# Patient Record
Sex: Male | Born: 1950 | Race: White | Hispanic: No | Smoking: Former smoker
Health system: Southern US, Community
[De-identification: ages and names within clinical notes are randomized; demographics above are authoritative.]

## PROBLEM LIST (undated history)

## (undated) DIAGNOSIS — K2270 Barrett's esophagus without dysplasia: Secondary | ICD-10-CM

## (undated) DIAGNOSIS — C649 Malignant neoplasm of unspecified kidney, except renal pelvis: Secondary | ICD-10-CM

## (undated) DIAGNOSIS — R1032 Left lower quadrant pain: Secondary | ICD-10-CM

## (undated) DIAGNOSIS — M25551 Pain in right hip: Secondary | ICD-10-CM

## (undated) DIAGNOSIS — F528 Other sexual dysfunction not due to a substance or known physiological condition: Secondary | ICD-10-CM

## (undated) DIAGNOSIS — M549 Dorsalgia, unspecified: Secondary | ICD-10-CM

## (undated) DIAGNOSIS — F4321 Adjustment disorder with depressed mood: Secondary | ICD-10-CM

## (undated) DIAGNOSIS — E785 Hyperlipidemia, unspecified: Secondary | ICD-10-CM

## (undated) DIAGNOSIS — I10 Essential (primary) hypertension: Secondary | ICD-10-CM

## (undated) DIAGNOSIS — F411 Generalized anxiety disorder: Secondary | ICD-10-CM

## (undated) DIAGNOSIS — K219 Gastro-esophageal reflux disease without esophagitis: Secondary | ICD-10-CM

## (undated) DIAGNOSIS — B351 Tinea unguium: Secondary | ICD-10-CM

## (undated) DIAGNOSIS — N400 Enlarged prostate without lower urinary tract symptoms: Secondary | ICD-10-CM

## (undated) DIAGNOSIS — Z0000 Encounter for general adult medical examination without abnormal findings: Secondary | ICD-10-CM

## (undated) DIAGNOSIS — E739 Lactose intolerance, unspecified: Secondary | ICD-10-CM

## (undated) HISTORY — DX: Left lower quadrant pain: R10.32

## (undated) HISTORY — DX: Essential (primary) hypertension: I10

## (undated) HISTORY — PX: CERVICAL SPINE SURGERY: SHX589

## (undated) HISTORY — DX: Generalized anxiety disorder: F41.1

## (undated) HISTORY — DX: Gastro-esophageal reflux disease without esophagitis: K21.9

## (undated) HISTORY — DX: Dorsalgia, unspecified: M54.9

## (undated) HISTORY — DX: Benign prostatic hyperplasia without lower urinary tract symptoms: N40.0

## (undated) HISTORY — DX: Tinea unguium: B35.1

## (undated) HISTORY — DX: Lactose intolerance, unspecified: E73.9

## (undated) HISTORY — DX: Other disorders of bilirubin metabolism: E80.6

## (undated) HISTORY — DX: Pain in right hip: M25.551

## (undated) HISTORY — DX: Malignant neoplasm of unspecified kidney, except renal pelvis: C64.9

## (undated) HISTORY — DX: Adjustment disorder with depressed mood: F43.21

## (undated) HISTORY — DX: Barrett's esophagus without dysplasia: K22.70

## (undated) HISTORY — DX: Other sexual dysfunction not due to a substance or known physiological condition: F52.8

## (undated) HISTORY — DX: Hyperlipidemia, unspecified: E78.5

## (undated) HISTORY — PX: PARTIAL NEPHRECTOMY: SHX414

## (undated) HISTORY — DX: Encounter for general adult medical examination without abnormal findings: Z00.00

---

## 1998-06-28 ENCOUNTER — Encounter: Admission: RE | Admit: 1998-06-28 | Discharge: 1998-09-26 | Payer: Self-pay | Admitting: Family Medicine

## 1999-09-26 ENCOUNTER — Encounter: Payer: Self-pay | Admitting: Orthopedic Surgery

## 1999-09-26 ENCOUNTER — Ambulatory Visit (HOSPITAL_COMMUNITY): Admission: EM | Admit: 1999-09-26 | Discharge: 1999-09-26 | Payer: Self-pay | Admitting: Emergency Medicine

## 1999-11-11 ENCOUNTER — Ambulatory Visit (HOSPITAL_BASED_OUTPATIENT_CLINIC_OR_DEPARTMENT_OTHER): Admission: RE | Admit: 1999-11-11 | Discharge: 1999-11-11 | Payer: Self-pay | Admitting: Surgery

## 2000-08-30 ENCOUNTER — Ambulatory Visit (HOSPITAL_COMMUNITY): Admission: RE | Admit: 2000-08-30 | Discharge: 2000-08-30 | Payer: Self-pay | Admitting: Gastroenterology

## 2000-08-30 ENCOUNTER — Encounter: Payer: Self-pay | Admitting: Gastroenterology

## 2000-09-04 ENCOUNTER — Encounter: Admission: RE | Admit: 2000-09-04 | Discharge: 2000-09-04 | Payer: Self-pay | Admitting: Gastroenterology

## 2000-09-04 ENCOUNTER — Encounter: Payer: Self-pay | Admitting: Gastroenterology

## 2000-09-07 ENCOUNTER — Encounter: Admission: RE | Admit: 2000-09-07 | Discharge: 2000-09-07 | Payer: Self-pay | Admitting: Urology

## 2000-09-07 ENCOUNTER — Encounter: Payer: Self-pay | Admitting: Urology

## 2000-09-24 ENCOUNTER — Ambulatory Visit: Admission: RE | Admit: 2000-09-24 | Discharge: 2000-09-24 | Payer: Self-pay | Admitting: Surgery

## 2001-03-04 ENCOUNTER — Encounter: Payer: Self-pay | Admitting: Gastroenterology

## 2001-03-04 ENCOUNTER — Encounter: Admission: RE | Admit: 2001-03-04 | Discharge: 2001-03-04 | Payer: Self-pay | Admitting: Gastroenterology

## 2001-03-06 ENCOUNTER — Encounter: Admission: RE | Admit: 2001-03-06 | Discharge: 2001-03-06 | Payer: Self-pay | Admitting: Urology

## 2001-03-06 ENCOUNTER — Encounter: Payer: Self-pay | Admitting: Urology

## 2001-03-11 ENCOUNTER — Encounter (INDEPENDENT_AMBULATORY_CARE_PROVIDER_SITE_OTHER): Payer: Self-pay | Admitting: Specialist

## 2001-03-11 ENCOUNTER — Ambulatory Visit (HOSPITAL_COMMUNITY): Admission: RE | Admit: 2001-03-11 | Discharge: 2001-03-11 | Payer: Self-pay | Admitting: Gastroenterology

## 2001-03-28 ENCOUNTER — Ambulatory Visit (HOSPITAL_COMMUNITY): Admission: RE | Admit: 2001-03-28 | Discharge: 2001-03-28 | Payer: Self-pay | Admitting: Gastroenterology

## 2001-03-28 ENCOUNTER — Encounter: Payer: Self-pay | Admitting: Gastroenterology

## 2001-07-15 ENCOUNTER — Encounter: Admission: RE | Admit: 2001-07-15 | Discharge: 2001-07-15 | Payer: Self-pay | Admitting: Urology

## 2001-07-15 ENCOUNTER — Encounter: Payer: Self-pay | Admitting: Urology

## 2001-11-25 ENCOUNTER — Encounter: Admission: RE | Admit: 2001-11-25 | Discharge: 2001-11-25 | Payer: Self-pay | Admitting: Urology

## 2001-11-25 ENCOUNTER — Encounter: Payer: Self-pay | Admitting: Urology

## 2002-05-09 ENCOUNTER — Encounter: Admission: RE | Admit: 2002-05-09 | Discharge: 2002-05-09 | Payer: Self-pay | Admitting: Urology

## 2002-05-09 ENCOUNTER — Encounter: Payer: Self-pay | Admitting: Urology

## 2002-05-28 ENCOUNTER — Emergency Department (HOSPITAL_COMMUNITY): Admission: EM | Admit: 2002-05-28 | Discharge: 2002-05-28 | Payer: Self-pay | Admitting: Emergency Medicine

## 2002-06-15 ENCOUNTER — Emergency Department (HOSPITAL_COMMUNITY): Admission: EM | Admit: 2002-06-15 | Discharge: 2002-06-16 | Payer: Self-pay | Admitting: Emergency Medicine

## 2002-06-15 ENCOUNTER — Emergency Department (HOSPITAL_COMMUNITY): Admission: EM | Admit: 2002-06-15 | Discharge: 2002-06-15 | Payer: Self-pay | Admitting: Emergency Medicine

## 2002-06-25 ENCOUNTER — Emergency Department (HOSPITAL_COMMUNITY): Admission: EM | Admit: 2002-06-25 | Discharge: 2002-06-26 | Payer: Self-pay | Admitting: Emergency Medicine

## 2003-05-11 ENCOUNTER — Encounter: Admission: RE | Admit: 2003-05-11 | Discharge: 2003-05-11 | Payer: Self-pay | Admitting: Urology

## 2003-08-07 ENCOUNTER — Emergency Department (HOSPITAL_COMMUNITY): Admission: EM | Admit: 2003-08-07 | Discharge: 2003-08-07 | Payer: Self-pay | Admitting: Emergency Medicine

## 2003-08-10 ENCOUNTER — Encounter: Admission: RE | Admit: 2003-08-10 | Discharge: 2003-08-10 | Payer: Self-pay | Admitting: Gastroenterology

## 2003-08-26 ENCOUNTER — Ambulatory Visit (HOSPITAL_COMMUNITY): Admission: RE | Admit: 2003-08-26 | Discharge: 2003-08-26 | Payer: Self-pay | Admitting: Gastroenterology

## 2003-08-26 ENCOUNTER — Encounter (INDEPENDENT_AMBULATORY_CARE_PROVIDER_SITE_OTHER): Payer: Self-pay | Admitting: Specialist

## 2003-10-25 ENCOUNTER — Emergency Department (HOSPITAL_COMMUNITY): Admission: EM | Admit: 2003-10-25 | Discharge: 2003-10-25 | Payer: Self-pay | Admitting: Emergency Medicine

## 2003-11-06 ENCOUNTER — Emergency Department (HOSPITAL_COMMUNITY): Admission: EM | Admit: 2003-11-06 | Discharge: 2003-11-06 | Payer: Self-pay

## 2004-04-21 ENCOUNTER — Encounter: Admission: RE | Admit: 2004-04-21 | Discharge: 2004-04-21 | Payer: Self-pay | Admitting: Gastroenterology

## 2004-05-12 ENCOUNTER — Encounter: Admission: RE | Admit: 2004-05-12 | Discharge: 2004-08-10 | Payer: Self-pay | Admitting: Family Medicine

## 2004-06-16 ENCOUNTER — Ambulatory Visit (HOSPITAL_BASED_OUTPATIENT_CLINIC_OR_DEPARTMENT_OTHER): Admission: RE | Admit: 2004-06-16 | Discharge: 2004-06-16 | Payer: Self-pay | Admitting: Otolaryngology

## 2005-04-10 ENCOUNTER — Encounter: Admission: RE | Admit: 2005-04-10 | Discharge: 2005-04-10 | Payer: Self-pay | Admitting: Psychiatry

## 2005-04-22 ENCOUNTER — Encounter: Admission: RE | Admit: 2005-04-22 | Discharge: 2005-04-22 | Payer: Self-pay | Admitting: Psychiatry

## 2005-09-25 ENCOUNTER — Encounter: Admission: RE | Admit: 2005-09-25 | Discharge: 2005-09-25 | Payer: Self-pay | Admitting: Gastroenterology

## 2005-11-17 ENCOUNTER — Encounter: Admission: RE | Admit: 2005-11-17 | Discharge: 2005-11-17 | Payer: Self-pay | Admitting: Otolaryngology

## 2006-01-03 ENCOUNTER — Encounter: Admission: RE | Admit: 2006-01-03 | Discharge: 2006-01-03 | Payer: Self-pay | Admitting: Neurosurgery

## 2006-01-05 ENCOUNTER — Encounter: Admission: RE | Admit: 2006-01-05 | Discharge: 2006-01-05 | Payer: Self-pay | Admitting: Neurosurgery

## 2006-03-12 ENCOUNTER — Ambulatory Visit: Payer: Self-pay | Admitting: Family Medicine

## 2006-06-04 ENCOUNTER — Ambulatory Visit (HOSPITAL_COMMUNITY): Admission: RE | Admit: 2006-06-04 | Discharge: 2006-06-05 | Payer: Self-pay | Admitting: Neurosurgery

## 2006-08-16 DIAGNOSIS — G4700 Insomnia, unspecified: Secondary | ICD-10-CM

## 2006-08-16 DIAGNOSIS — M545 Low back pain, unspecified: Secondary | ICD-10-CM | POA: Insufficient documentation

## 2006-08-16 DIAGNOSIS — F528 Other sexual dysfunction not due to a substance or known physiological condition: Secondary | ICD-10-CM

## 2006-08-16 DIAGNOSIS — I10 Essential (primary) hypertension: Secondary | ICD-10-CM | POA: Insufficient documentation

## 2006-08-16 DIAGNOSIS — F909 Attention-deficit hyperactivity disorder, unspecified type: Secondary | ICD-10-CM

## 2006-08-16 DIAGNOSIS — K219 Gastro-esophageal reflux disease without esophagitis: Secondary | ICD-10-CM

## 2006-08-16 DIAGNOSIS — E785 Hyperlipidemia, unspecified: Secondary | ICD-10-CM

## 2006-08-16 HISTORY — DX: Other sexual dysfunction not due to a substance or known physiological condition: F52.8

## 2006-08-16 HISTORY — DX: Gastro-esophageal reflux disease without esophagitis: K21.9

## 2006-08-16 HISTORY — DX: Hyperlipidemia, unspecified: E78.5

## 2006-09-13 ENCOUNTER — Encounter: Admission: RE | Admit: 2006-09-13 | Discharge: 2006-09-13 | Payer: Self-pay | Admitting: Neurosurgery

## 2006-12-10 ENCOUNTER — Ambulatory Visit: Payer: Self-pay | Admitting: Family Medicine

## 2006-12-17 ENCOUNTER — Ambulatory Visit: Payer: Self-pay | Admitting: Family Medicine

## 2006-12-25 LAB — CONVERTED CEMR LAB
HDL: 43.8 mg/dL (ref >39.0)
Triglycerides: 251 mg/dL (ref 0–149)

## 2006-12-31 ENCOUNTER — Telehealth (INDEPENDENT_AMBULATORY_CARE_PROVIDER_SITE_OTHER): Payer: Self-pay | Admitting: *Deleted

## 2007-01-02 ENCOUNTER — Encounter: Admission: RE | Admit: 2007-01-02 | Discharge: 2007-01-02 | Payer: Self-pay | Admitting: Neurosurgery

## 2007-01-04 ENCOUNTER — Encounter (INDEPENDENT_AMBULATORY_CARE_PROVIDER_SITE_OTHER): Payer: Self-pay | Admitting: Family Medicine

## 2007-01-16 ENCOUNTER — Encounter (INDEPENDENT_AMBULATORY_CARE_PROVIDER_SITE_OTHER): Payer: Self-pay | Admitting: Family Medicine

## 2007-01-18 ENCOUNTER — Telehealth (INDEPENDENT_AMBULATORY_CARE_PROVIDER_SITE_OTHER): Payer: Self-pay | Admitting: *Deleted

## 2007-01-28 ENCOUNTER — Encounter (INDEPENDENT_AMBULATORY_CARE_PROVIDER_SITE_OTHER): Payer: Self-pay | Admitting: Family Medicine

## 2007-02-21 ENCOUNTER — Encounter (INDEPENDENT_AMBULATORY_CARE_PROVIDER_SITE_OTHER): Payer: Self-pay | Admitting: Family Medicine

## 2007-03-12 ENCOUNTER — Encounter: Admission: RE | Admit: 2007-03-12 | Discharge: 2007-03-12 | Payer: Self-pay | Admitting: Neurosurgery

## 2007-03-18 ENCOUNTER — Telehealth (INDEPENDENT_AMBULATORY_CARE_PROVIDER_SITE_OTHER): Payer: Self-pay | Admitting: *Deleted

## 2007-03-25 ENCOUNTER — Telehealth (INDEPENDENT_AMBULATORY_CARE_PROVIDER_SITE_OTHER): Payer: Self-pay | Admitting: *Deleted

## 2007-04-02 ENCOUNTER — Ambulatory Visit: Payer: Self-pay | Admitting: Family Medicine

## 2007-04-03 ENCOUNTER — Telehealth (INDEPENDENT_AMBULATORY_CARE_PROVIDER_SITE_OTHER): Payer: Self-pay | Admitting: *Deleted

## 2007-04-08 ENCOUNTER — Ambulatory Visit: Payer: Self-pay | Admitting: Family Medicine

## 2007-04-08 DIAGNOSIS — F411 Generalized anxiety disorder: Secondary | ICD-10-CM | POA: Insufficient documentation

## 2007-04-08 DIAGNOSIS — K120 Recurrent oral aphthae: Secondary | ICD-10-CM | POA: Insufficient documentation

## 2007-04-08 HISTORY — DX: Generalized anxiety disorder: F41.1

## 2007-05-13 ENCOUNTER — Telehealth (INDEPENDENT_AMBULATORY_CARE_PROVIDER_SITE_OTHER): Payer: Self-pay | Admitting: Family Medicine

## 2007-05-13 ENCOUNTER — Ambulatory Visit: Payer: Self-pay | Admitting: Family Medicine

## 2007-05-13 DIAGNOSIS — K4090 Unilateral inguinal hernia, without obstruction or gangrene, not specified as recurrent: Secondary | ICD-10-CM

## 2007-05-16 ENCOUNTER — Encounter: Admission: RE | Admit: 2007-05-16 | Discharge: 2007-05-16 | Payer: Self-pay | Admitting: Neurosurgery

## 2007-06-12 ENCOUNTER — Encounter: Admission: RE | Admit: 2007-06-12 | Discharge: 2007-06-12 | Payer: Self-pay | Admitting: Surgery

## 2007-06-17 ENCOUNTER — Ambulatory Visit: Admission: RE | Admit: 2007-06-17 | Discharge: 2007-06-17 | Payer: Self-pay | Admitting: Neurosurgery

## 2007-07-15 ENCOUNTER — Telehealth (INDEPENDENT_AMBULATORY_CARE_PROVIDER_SITE_OTHER): Payer: Self-pay | Admitting: *Deleted

## 2007-07-15 ENCOUNTER — Encounter (INDEPENDENT_AMBULATORY_CARE_PROVIDER_SITE_OTHER): Payer: Self-pay | Admitting: *Deleted

## 2007-07-16 ENCOUNTER — Inpatient Hospital Stay (HOSPITAL_COMMUNITY): Admission: RE | Admit: 2007-07-16 | Discharge: 2007-07-17 | Payer: Self-pay | Admitting: Neurosurgery

## 2007-09-05 ENCOUNTER — Telehealth: Payer: Self-pay | Admitting: Internal Medicine

## 2007-10-03 ENCOUNTER — Encounter: Admission: RE | Admit: 2007-10-03 | Discharge: 2007-10-03 | Payer: Self-pay | Admitting: Neurosurgery

## 2007-11-04 ENCOUNTER — Telehealth (INDEPENDENT_AMBULATORY_CARE_PROVIDER_SITE_OTHER): Payer: Self-pay | Admitting: *Deleted

## 2007-11-11 ENCOUNTER — Ambulatory Visit: Payer: Self-pay | Admitting: *Deleted

## 2007-11-11 DIAGNOSIS — K2270 Barrett's esophagus without dysplasia: Secondary | ICD-10-CM

## 2007-11-11 DIAGNOSIS — M549 Dorsalgia, unspecified: Secondary | ICD-10-CM | POA: Insufficient documentation

## 2007-11-11 DIAGNOSIS — M546 Pain in thoracic spine: Secondary | ICD-10-CM | POA: Insufficient documentation

## 2007-11-11 HISTORY — DX: Dorsalgia, unspecified: M54.9

## 2007-11-11 HISTORY — DX: Barrett's esophagus without dysplasia: K22.70

## 2007-11-12 ENCOUNTER — Encounter: Admission: RE | Admit: 2007-11-12 | Discharge: 2007-11-12 | Payer: Self-pay | Admitting: Neurosurgery

## 2007-11-16 ENCOUNTER — Encounter: Admission: RE | Admit: 2007-11-16 | Discharge: 2007-11-16 | Payer: Self-pay | Admitting: Neurosurgery

## 2007-12-09 ENCOUNTER — Ambulatory Visit: Payer: Self-pay | Admitting: *Deleted

## 2007-12-09 LAB — CONVERTED CEMR LAB
AST: 30 units/L (ref 0–37)
Albumin: 4.1 g/dL (ref 3.5–5.2)
Alkaline Phosphatase: 73 units/L (ref 39–117)
BUN: 22 mg/dL (ref 6–23)
Chloride: 112 meq/L (ref 96–112)
Eosinophils Relative: 0.8 % (ref 0.0–5.0)
GFR calc non Af Amer: 93 mL/min
Glucose, Bld: 103 mg/dL — ABNORMAL HIGH (ref 70–99)
HDL: 44.9 mg/dL (ref >39.0)
Lymphocytes Relative: 31 % (ref 12.0–46.0)
Monocytes Relative: 12.1 % — ABNORMAL HIGH (ref 3.0–12.0)
Neutrophils Relative %: 55.6 % (ref 43.0–77.0)
Platelets: 159 10*3/uL (ref 150–400)
Potassium: 4.3 meq/L (ref 3.5–5.1)
Total CHOL/HDL Ratio: 3.8
Total Protein: 6.3 g/dL (ref 6.0–8.3)
Triglycerides: 209 mg/dL (ref 0–149)
WBC: 4.1 10*3/uL — ABNORMAL LOW (ref 4.5–10.5)

## 2007-12-11 ENCOUNTER — Telehealth (INDEPENDENT_AMBULATORY_CARE_PROVIDER_SITE_OTHER): Payer: Self-pay | Admitting: *Deleted

## 2007-12-11 DIAGNOSIS — D72818 Other decreased white blood cell count: Secondary | ICD-10-CM | POA: Insufficient documentation

## 2007-12-11 DIAGNOSIS — R17 Unspecified jaundice: Secondary | ICD-10-CM | POA: Insufficient documentation

## 2007-12-11 DIAGNOSIS — E739 Lactose intolerance, unspecified: Secondary | ICD-10-CM | POA: Insufficient documentation

## 2007-12-11 HISTORY — DX: Lactose intolerance, unspecified: E73.9

## 2007-12-16 ENCOUNTER — Ambulatory Visit: Payer: Self-pay | Admitting: *Deleted

## 2008-01-20 ENCOUNTER — Ambulatory Visit: Payer: Self-pay | Admitting: *Deleted

## 2008-01-22 HISTORY — DX: Other disorders of bilirubin metabolism: E80.6

## 2008-01-22 LAB — CONVERTED CEMR LAB
Albumin: 4 g/dL (ref 3.5–5.2)
Alkaline Phosphatase: 75 units/L (ref 39–117)
Basophils Absolute: 0 10*3/uL (ref 0.0–0.1)
Basophils Relative: 0.4 % (ref 0.0–3.0)
Eosinophils Absolute: 0 10*3/uL (ref 0.0–0.7)
Glucose, Bld: 103 mg/dL — ABNORMAL HIGH (ref 70–99)
MCHC: 35.3 g/dL (ref 30.0–36.0)
MCV: 95.1 fL (ref 78.0–100.0)
Monocytes Absolute: 0.5 10*3/uL (ref 0.1–1.0)
Neutrophils Relative %: 55.6 % (ref 43.0–77.0)
RBC: 4.55 M/uL (ref 4.22–5.81)

## 2008-01-24 ENCOUNTER — Ambulatory Visit: Payer: Self-pay | Admitting: Hematology & Oncology

## 2008-02-03 ENCOUNTER — Encounter (INDEPENDENT_AMBULATORY_CARE_PROVIDER_SITE_OTHER): Payer: Self-pay | Admitting: *Deleted

## 2008-02-03 LAB — CBC WITH DIFFERENTIAL (CANCER CENTER ONLY)
BASO%: 0.6 % (ref 0.0–2.0)
EOS%: 1.2 % (ref 0.0–7.0)
HCT: 45 % (ref 38.7–49.9)
LYMPH%: 18 % (ref 14.0–48.0)
MCHC: 34.6 g/dL (ref 32.0–35.9)
MCV: 93 fL (ref 82–98)
NEUT%: 72.9 % (ref 40.0–80.0)
RDW: 11.8 % (ref 10.5–14.6)

## 2008-02-19 ENCOUNTER — Telehealth (INDEPENDENT_AMBULATORY_CARE_PROVIDER_SITE_OTHER): Payer: Self-pay | Admitting: *Deleted

## 2008-04-13 ENCOUNTER — Telehealth (INDEPENDENT_AMBULATORY_CARE_PROVIDER_SITE_OTHER): Payer: Self-pay | Admitting: *Deleted

## 2008-05-01 ENCOUNTER — Ambulatory Visit: Payer: Self-pay | Admitting: Hematology & Oncology

## 2008-05-04 LAB — CBC WITH DIFFERENTIAL (CANCER CENTER ONLY)
BASO%: 0.6 % (ref 0.0–2.0)
Eosinophils Absolute: 0.1 10*3/uL (ref 0.0–0.5)
HCT: 45.4 % (ref 38.7–49.9)
LYMPH%: 22.9 % (ref 14.0–48.0)
MCH: 32.7 pg (ref 28.0–33.4)
MCV: 96 fL (ref 82–98)
MONO#: 0.4 10*3/uL (ref 0.1–0.9)
NEUT%: 67.1 % (ref 40.0–80.0)
RDW: 11.5 % (ref 10.5–14.6)
WBC: 4.5 10*3/uL (ref 4.0–10.0)

## 2008-05-18 ENCOUNTER — Ambulatory Visit: Payer: Self-pay | Admitting: *Deleted

## 2008-05-18 LAB — CONVERTED CEMR LAB
BUN: 21 mg/dL (ref 6–23)
CO2: 31 meq/L (ref 19–32)
Calcium: 9.3 mg/dL (ref 8.4–10.5)
Cholesterol: 195 mg/dL (ref 0–200)
Creatinine, Ser: 1 mg/dL (ref 0.4–1.5)
GFR calc Af Amer: 99 mL/min
GFR calc non Af Amer: 82 mL/min
Glucose, Bld: 106 mg/dL — ABNORMAL HIGH (ref 70–99)
HDL: 41.7 mg/dL (ref >39.0)
Total Bilirubin: 1.1 mg/dL (ref 0.3–1.2)
Total CHOL/HDL Ratio: 4.7
Triglycerides: 308 mg/dL (ref 0–149)

## 2008-05-22 ENCOUNTER — Telehealth (INDEPENDENT_AMBULATORY_CARE_PROVIDER_SITE_OTHER): Payer: Self-pay | Admitting: *Deleted

## 2008-06-10 ENCOUNTER — Ambulatory Visit: Payer: Self-pay | Admitting: *Deleted

## 2008-06-10 DIAGNOSIS — J40 Bronchitis, not specified as acute or chronic: Secondary | ICD-10-CM | POA: Insufficient documentation

## 2008-06-29 ENCOUNTER — Telehealth (INDEPENDENT_AMBULATORY_CARE_PROVIDER_SITE_OTHER): Payer: Self-pay | Admitting: *Deleted

## 2008-07-15 ENCOUNTER — Encounter
Admission: RE | Admit: 2008-07-15 | Discharge: 2008-07-15 | Payer: Self-pay | Admitting: Physical Medicine and Rehabilitation

## 2008-07-21 ENCOUNTER — Telehealth (INDEPENDENT_AMBULATORY_CARE_PROVIDER_SITE_OTHER): Payer: Self-pay | Admitting: *Deleted

## 2008-09-25 ENCOUNTER — Ambulatory Visit: Payer: Self-pay | Admitting: Hematology & Oncology

## 2008-09-28 ENCOUNTER — Encounter: Payer: Self-pay | Admitting: Internal Medicine

## 2008-09-28 LAB — CBC WITH DIFFERENTIAL (CANCER CENTER ONLY)
BASO#: 0 10*3/uL (ref 0.0–0.2)
Eosinophils Absolute: 0.1 10*3/uL (ref 0.0–0.5)
HCT: 46.1 % (ref 38.7–49.9)
HGB: 15.5 g/dL (ref 13.0–17.1)
LYMPH%: 24.9 % (ref 14.0–48.0)
MCV: 96 fL (ref 82–98)
MONO#: 0.4 10*3/uL (ref 0.1–0.9)
NEUT%: 65.5 % (ref 40.0–80.0)
Platelets: 203 10*3/uL (ref 145–400)
RBC: 4.82 10*6/uL (ref 4.20–5.70)
WBC: 4.9 10*3/uL (ref 4.0–10.0)

## 2008-10-12 ENCOUNTER — Ambulatory Visit: Payer: Self-pay | Admitting: Internal Medicine

## 2008-10-12 DIAGNOSIS — IMO0001 Reserved for inherently not codable concepts without codable children: Secondary | ICD-10-CM

## 2008-10-12 LAB — CONVERTED CEMR LAB
AST: 27 units/L (ref 0–37)
Albumin: 4.4 g/dL (ref 3.5–5.2)
Alkaline Phosphatase: 89 units/L (ref 39–117)
BUN: 21 mg/dL (ref 6–23)
Calcium: 9.4 mg/dL (ref 8.4–10.5)
Creatinine, Ser: 1.02 mg/dL (ref 0.40–1.50)
HDL goal, serum: 40 mg/dL
HDL: 45 mg/dL (ref >39)
Hgb A1c MFr Bld: 5.7 % (ref 4.6–6.1)
Indirect Bilirubin: 0.7 mg/dL (ref 0.0–0.9)
LDL Goal: 160 mg/dL
Total Bilirubin: 0.9 mg/dL (ref 0.3–1.2)
Total Protein: 6.9 g/dL (ref 6.0–8.3)
Triglycerides: 286 mg/dL — ABNORMAL HIGH (ref <150)
Vit D, 1,25-Dihydroxy: 41 (ref 30–89)

## 2008-10-16 ENCOUNTER — Encounter: Payer: Self-pay | Admitting: Internal Medicine

## 2008-11-04 ENCOUNTER — Telehealth: Payer: Self-pay | Admitting: Internal Medicine

## 2008-11-13 ENCOUNTER — Ambulatory Visit: Payer: Self-pay | Admitting: Diagnostic Radiology

## 2008-11-13 ENCOUNTER — Emergency Department (HOSPITAL_BASED_OUTPATIENT_CLINIC_OR_DEPARTMENT_OTHER): Admission: EM | Admit: 2008-11-13 | Discharge: 2008-11-13 | Payer: Self-pay | Admitting: Emergency Medicine

## 2008-11-15 ENCOUNTER — Emergency Department (HOSPITAL_BASED_OUTPATIENT_CLINIC_OR_DEPARTMENT_OTHER): Admission: EM | Admit: 2008-11-15 | Discharge: 2008-11-15 | Payer: Self-pay | Admitting: Emergency Medicine

## 2008-11-15 ENCOUNTER — Ambulatory Visit: Payer: Self-pay | Admitting: Diagnostic Radiology

## 2008-12-14 ENCOUNTER — Encounter: Payer: Self-pay | Admitting: Internal Medicine

## 2009-04-24 LAB — HM COLONOSCOPY

## 2009-04-26 ENCOUNTER — Ambulatory Visit: Payer: Self-pay | Admitting: Internal Medicine

## 2009-08-12 ENCOUNTER — Telehealth: Payer: Self-pay | Admitting: Internal Medicine

## 2009-10-04 ENCOUNTER — Ambulatory Visit: Payer: Self-pay | Admitting: Internal Medicine

## 2009-10-04 DIAGNOSIS — R5381 Other malaise: Secondary | ICD-10-CM

## 2009-10-04 DIAGNOSIS — R5383 Other fatigue: Secondary | ICD-10-CM

## 2009-10-05 ENCOUNTER — Encounter: Payer: Self-pay | Admitting: Internal Medicine

## 2009-10-26 IMAGING — CR DG CHEST 2V
2 series · 2 of 2 positions shown · non-contrast
Comparison: Chest 08/07/03.

CLINICAL DATA: Abdominal pain particularly right lower quadrant.  History of renal cell carcinoma in 7990. 
 CHEST - 2 VIEW:

[view not recorded (1 of 2)]
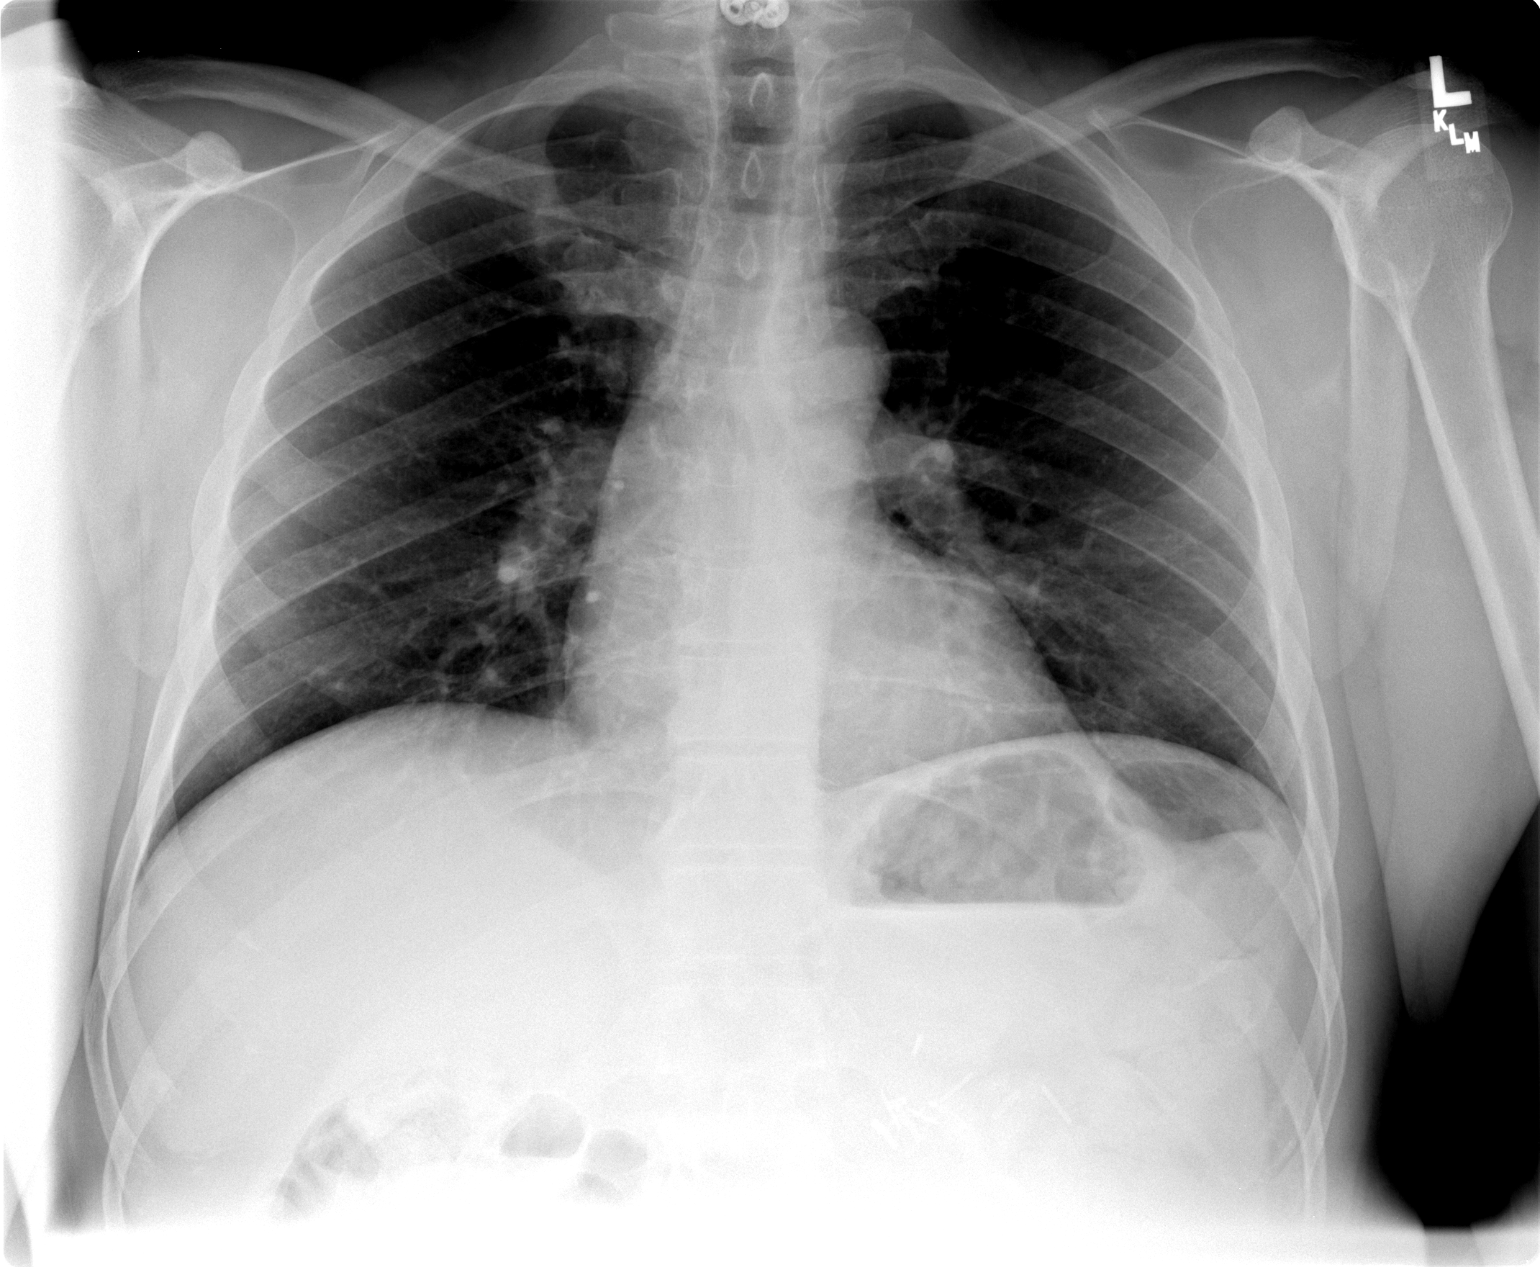

[view not recorded (2 of 2)]
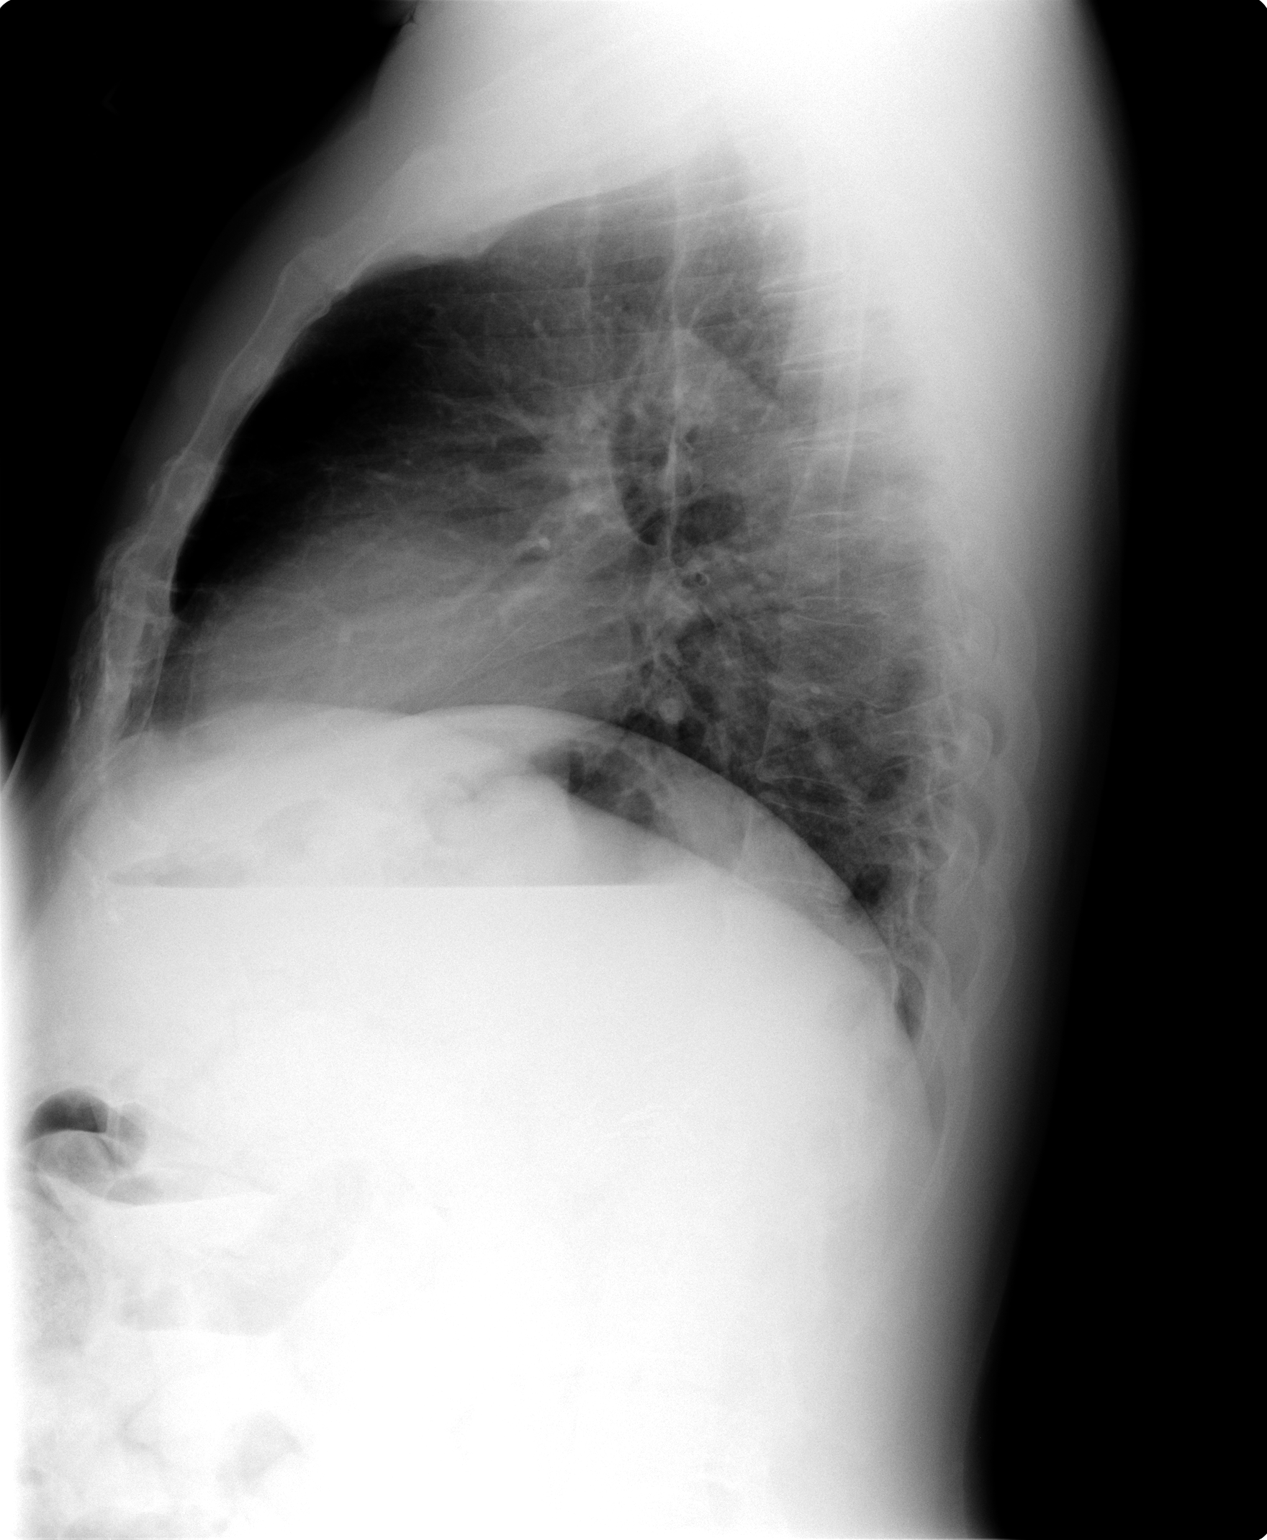

[2 of 2 positions shown; findings below may reference images not displayed]

FINDINGS: The lungs are clear.  The heart is within normal limits in size.  No bony abnormality is seen.
IMPRESSION: No active lung disease.

## 2010-01-31 ENCOUNTER — Encounter: Admission: RE | Admit: 2010-01-31 | Discharge: 2010-01-31 | Payer: Self-pay | Admitting: Neurosurgery

## 2010-05-15 ENCOUNTER — Encounter: Payer: Self-pay | Admitting: Neurosurgery

## 2010-05-22 LAB — CONVERTED CEMR LAB
ALT: 41 units/L (ref 0–53)
BUN: 20 mg/dL (ref 6–23)
Basophils Absolute: 0.1 10*3/uL (ref 0.0–0.1)
Bilirubin, Direct: 0.2 mg/dL (ref 0.0–0.3)
CO2: 24 meq/L (ref 19–32)
Chloride: 103 meq/L (ref 96–112)
Eosinophils Relative: 0 % (ref 0–5)
Glucose, Bld: 101 mg/dL — ABNORMAL HIGH (ref 70–99)
HCT: 51.5 % (ref 39.0–52.0)
Hemoglobin: 16.5 g/dL (ref 13.0–17.0)
Indirect Bilirubin: 0.9 mg/dL (ref 0.0–0.9)
LDL Cholesterol: 98 mg/dL (ref 0–99)
Lymphocytes Relative: 25 % (ref 12–46)
Monocytes Absolute: 0.6 10*3/uL (ref 0.1–1.0)
Monocytes Relative: 11 % (ref 3–12)
Neutro Abs: 3.5 10*3/uL (ref 1.7–7.7)
Potassium: 4.7 meq/L (ref 3.5–5.3)
RBC: 5.07 M/uL (ref 4.22–5.81)
RDW: 15 % (ref 11.5–15.5)
Sodium: 140 meq/L (ref 135–145)
TSH: 1.337 microintl units/mL (ref 0.350–4.500)
Testosterone: 312.94 ng/dL — ABNORMAL LOW (ref 350–890)
Total Bilirubin: 1.1 mg/dL (ref 0.3–1.2)
Total CHOL/HDL Ratio: 4.2
Total CK: 160 units/L (ref 7–232)
VLDL: 64 mg/dL — ABNORMAL HIGH (ref 0–40)

## 2010-05-26 NOTE — Assessment & Plan Note (Signed)
Summary: 6 MONTHS ROV-CH   Vital Signs:  Patient profile:   60 year old male Height:      65.5 inches Weight:      183.75 pounds BMI:     30.22 O2 Sat:      97 % on Room air Temp:     98.2 degrees F oral Pulse rate:   70 / minute BP sitting:   126 / 84  (right arm)  Vitals Entered By: Lucious Groves (April 26, 2009 10:44 AM)  O2 Flow:  Room air CC: Est pt 6 mo f/u--./kb Pain Assessment Patient in pain? no        Primary Care Provider:  Dondra Spry DO  CC:  Est pt 6 mo f/u--./kb.  History of Present Illness:  Hyperlipidemia Follow-Up      This is a 60 year old man who presents for Hyperlipidemia follow-up.  The patient denies muscle aches.  The patient denies the following symptoms: chest pain/pressure.  Compliance with medications (by patient report) has been near 100%.  Dietary compliance has been fair.    Hx of Barretts - consultant notes reviewed  Current Medications (verified): 1)  Lipitor 40 Mg Tabs (Atorvastatin Calcium) .... Take 1 Tablet By Mouth Once A Day 2)  Levitra 20 Mg Tabs (Vardenafil Hcl) .... Take As Directed. 3)  Niaspan 1000 Mg Tbcr (Niacin (Antihyperlipidemic)) .... Take 1 Tablet By Mouth Once A Day 4)  Adderall 20 Mg  Tabs (Amphetamine-Dextroamphetamine) .... Take 1 Tablet By Mouth Three Times A Day 5)  Hydrocodone-Acetaminophen 5-500 Mg  Tabs (Hydrocodone-Acetaminophen) .... Take One Tablet Three Times A Day 6)  Lunesta 3 Mg  Tabs (Eszopiclone) .... Take One Tablet Each Evening. 7)  Effexor 75 Mg  Tabs (Venlafaxine Hcl) .... Take One Tablet Three Times A Day 8)  Trazodone Hcl 300 Mg  Tabs (Trazodone Hcl) .... Take One Tablet Each Evening. 9)  Magic Mouth Wash .... 1 Tsp Swish and Spit Qid 10)  Nexium 40 Mg  Cpdr (Esomeprazole Magnesium) .... Take 1 Tablet By Mouth Once A Day 11)  Omega-3 350 Mg Caps (Omega-3 Fatty Acids) .... 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) - sees GI glucose intolerance Gilbert's syndrome  Current Problems:  OTHER DECREASED WHITE BLOOD CELL COUNT (ICD-288.59) - sees hematology ELEVATED BP READING WITHOUT DX HYPERTENSION (ICD-796.2) BACK PAIN, CHRONIC (ICD-724.5) - sees neurosurgery and pain management OTHER ANXIETY STATES (ICD-300.09)) - sees psych recurrent bronchitis    Past Surgical History: partial nephrectomy right side for renal cell carcinoma left hand surgery  2002  Cervical fusion 2008, 2009    Family History: alzheimer: mother Family History High cholesterol - mother and father    Social History: Occupation: Geneticist, 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   Neck:  No deformities, masses, or tenderness noted.no carotid bruits.   Lungs:  normal respiratory effort and normal breath sounds.   Heart:  normal rate, regular rhythm, and no gallop.   Psych:  normally interactive, good eye contact, not anxious appearing, and not depressed appearing.     Impression & Recommendations:  Problem # 1:  HYPERLIPIDEMIA (ICD-272.4) stable.  Maintain current medication regimen.  His updated medication list for this problem includes:    Lipitor 40 Mg Tabs (Atorvastatin calcium) .Marland Kitchen... Take 1 tablet by mouth once a day    Niaspan 1000 Mg Tbcr (Niacin (antihyperlipidemic)) .Marland Kitchen... Take 1 tablet by mouth once a day  Labs Reviewed: SGOT: 27 (10/12/2008)   SGPT: 40 (10/12/2008)  Lipid Goals: Chol Goal: 200 (10/12/2008)   HDL Goal: 40 (10/12/2008)   LDL Goal: 160 (10/12/2008)   TG Goal: 150 (10/12/2008)  Prior 10 Yr Risk Heart Disease: 4 % (10/12/2008)   HDL:45 (10/12/2008), 41.7  (05/18/2008)  LDL:66 (10/12/2008), 92 (05/18/2008)  Chol:168 (10/12/2008), 195 (05/18/2008)  Trig:286 (10/12/2008), 308 (05/18/2008)  Problem # 2:  BARRETTS ESOPHAGUS (ICD-530.85) EGD performed 12/14/2008 - Dr. Ewing Schlein.  No malignancy.  Continue PPI.  Problem # 3:  ERECTILE DYSFUNCTION (ICD-302.72)  His updated medication list for this problem includes:    Levitra 20 Mg Tabs (Vardenafil hcl) .Marland Kitchen... Take as directed.  Discussed proper use of medications, as well as side effects.   Complete Medication List: 1)  Lipitor 40 Mg Tabs (Atorvastatin calcium) .... Take 1 tablet by mouth once a day 2)  Levitra 20 Mg Tabs (Vardenafil hcl) .... Take as directed. 3)  Niaspan 1000 Mg Tbcr (Niacin (antihyperlipidemic)) .... Take 1 tablet by mouth once a day 4)  Adderall 20 Mg Tabs (Amphetamine-dextroamphetamine) .... Take 1 tablet by mouth three times a day 5)  Hydrocodone-acetaminophen 5-500 Mg Tabs (Hydrocodone-acetaminophen) .... Take one tablet three times a day 6)  Lunesta 3 Mg Tabs (Eszopiclone) .... Take one tablet each evening. 7)  Effexor 75 Mg Tabs (Venlafaxine hcl) .... Take one tablet three times a day 8)  Trazodone Hcl 300 Mg Tabs (Trazodone hcl) .... Take one tablet each evening. 9)  Magic Mouth Wash  .... 1 tsp swish and spit qid 10)  Nexium 40 Mg Cpdr (Esomeprazole magnesium) .... Take 1 tablet by mouth once a day 11)  Omega-3 350 Mg Caps (Omega-3 fatty acids) .... Over the counter as directed on container  Other Orders: T-Basic Metabolic Panel [phone]) T- Hemoglobin A1C ([id number])  Patient Instructions: 1)  Please schedule a follow-up appointment in 6 months for CPX. 2)  BMP prior to visit, ICD-9:  272.4 3)  Hepatic Panel prior to visit, ICD-9:  272.4 4)  Lipid Panel prior to visit, ICD-9: 272.4 5)  TSH prior to visit, ICD-9: 272.4 6)  HbgA1C prior to visit, ICD-9: 790.29 7)  Please return for lab work one (1) week before your next appointment.  Prescriptions: LEVITRA 20  MG TABS (VARDENAFIL HCL) Take as directed.  #12 Tablet x 3   Entered and Authorized by:   [name] DO   Signed by:   [name] DO on 04/26/2009   Method used:   [facility] to        [facility] [phone][facility] (retail)       [address]       [place], [place]  [address]       Ph: [phone]       Fax: [phone]   RxID:   [id number]     Immunization History:  Influenza Immunization History:    Influenza:  historical (01/22/2009)

## 2010-05-26 NOTE — Assessment & Plan Note (Signed)
Summary: CPX/HEA--Rm 3   Vital Signs:  Patient profile:   60 year old male Height:      65.5 inches Weight:      170.50 pounds BMI:     28.04 Temp:     97.7 degrees F oral Pulse rate:   84 / minute Pulse rhythm:   regular Resp:     16 per minute BP sitting:   118 / 84  (right arm) Cuff size:   regular  Vitals Entered By: Mervin Kung CMA (October 04, 2009 8:39 AM) CC: Room 3  Pt here for physical.  Would like thyroid checked due to excessive sweating and fatigue.   Primary Care Provider:  Dondra Spry DO  CC:  Room 3  Pt here for physical.  Would like thyroid checked due to excessive sweating and fatigue.Marland Kitchen  History of Present Illness: 60 y/o white male with hx of hyperlipidemia, ADD, and barretts for routin CpX.    Effexor has changed to 1 two times a day  and Adderall has changed to 1 two times a day.  no other significant interval hx   Allergies (verified): No Known Drug Allergies  Past History:  Past Medical History: ERECTILE DYSFUNCTION (ICD-302.72) ADD (ICD-314.00) - sees psych INSOMNIA (ICD-780.52) - sees psych GERD (ICD-530.81) - sees GI   HYPERLIPIDEMIA (ICD-272.4) BARRETTS ESOPHAGUS (ICD-530.85) - sees GI glucose intolerance Gilbert's syndrome  Current Problems:  OTHER DECREASED WHITE BLOOD CELL COUNT (ICD-288.59) - sees hematology ELEVATED BP READING WITHOUT DX HYPERTENSION (ICD-796.2) BACK PAIN, CHRONIC (ICD-724.5) - sees neurosurgery and pain management OTHER ANXIETY STATES (ICD-300.09)) - sees psych recurrent bronchitis    Past Surgical History: partial nephrectomy right side for renal cell carcinoma left hand surgery  2002  Cervical fusion 2008, 2009      Family History: alzheimer: mother Family History High cholesterol - mother and father       Social History: Occupation: Geneticist, 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    Physical Exam  General:  alert, well-developed, and well-nourished.   Head:  normocephalic and atraumatic.   Mouth:  pharynx pink and moist.   Neck:  No deformities, masses, or tenderness noted.no carotid bruits.   Lungs:  normal respiratory effort and normal breath sounds.   Heart:  normal rate, regular rhythm, and no gallop.   Abdomen:  soft, non-tender, normal bowel sounds, no hepatomegaly, and no splenomegaly.   Extremities:  No lower extremity edema  Neurologic:  cranial nerves II-XII intact and gait normal.   Psych:  normally interactive, good eye contact, not anxious appearing, and not depressed appearing.     Impression & Recommendations:  Problem # 1:  HEALTH MAINTENANCE EXAM (ICD-V70.0) Reviewed adult health maintenance protocols.  Orders: EKG w/ Interpretation (93000)  Colonoscopy: normal (04/24/2005) Td Booster: Td (12/10/2006)   Flu Vax: Historical (01/22/2009)   Chol: 168 (10/12/2008)   HDL: 45 (10/12/2008)   LDL: 66 (10/12/2008)   TG: 286 (10/12/2008) TSH: 1.419 (10/12/2008)   HgbA1C: 5.7 (10/12/2008)   PSA: 3.52 (12/17/2006)  Problem # 2:  HYPERLIPIDEMIA (ICD-272.4) stable. monitor labs  His updated medication list for this problem includes:    Pravastatin Sodium 40 Mg Tabs (Pravastatin sodium) ..... One by mouth once daily    Niaspan 1000  Mg Tbcr (Niacin (antihyperlipidemic)) .Marland Kitchen... Take 1 tablet by mouth once a day  Orders: T-TSH [phone]) T-Lipid Profile [phone])  Labs Reviewed: SGOT: 27 (10/12/2008)   SGPT: 40 (10/12/2008)  Lipid Goals: Chol Goal: 200 (10/12/2008)   HDL Goal: 40 (10/12/2008)   LDL Goal: 160 (10/12/2008)   TG Goal: 150 (10/12/2008)  Prior 10 Yr Risk Heart Disease: 4 % (10/12/2008)   HDL:45 (10/12/2008), 41.7 (05/18/2008)  LDL:66 (10/12/2008), 92 (05/18/2008)  Chol:168 (10/12/2008), 195 (05/18/2008)   Trig:286 (10/12/2008), 308 (05/18/2008)  Problem # 3:  GERD (ICD-530.81) Hx of Barretts.  Followed by Dr. Ewing Schlein.  continue PPI His updated medication list for this problem includes:    Nexium 40 Mg Cpdr (Esomeprazole magnesium) .Marland Kitchen... Take 1 tablet by mouth once a day  Complete Medication List: 1)  Pravastatin Sodium 40 Mg Tabs (Pravastatin sodium) .... One by mouth once daily 2)  Levitra 20 Mg Tabs (Vardenafil hcl) .... Take as directed. 3)  Niaspan 1000 Mg Tbcr (Niacin (antihyperlipidemic)) .... Take 1 tablet by mouth once a day 4)  Adderall 20 Mg Tabs (Amphetamine-dextroamphetamine) .... Take 1 tablet by mouth three times a day 5)  Hydrocodone-acetaminophen 5-500 Mg Tabs (Hydrocodone-acetaminophen) .... Take one tablet three times a day 6)  Lunesta 3 Mg Tabs (Eszopiclone) .... Take one tablet each evening. 7)  Effexor 75 Mg Tabs (Venlafaxine hcl) .... Take one tablet three times a day 8)  Trazodone Hcl 300 Mg Tabs (Trazodone hcl) .... Take one tablet each evening. 9)  Magic Mouth Wash  .... 1 tsp swish and spit qid 10)  Nexium 40 Mg Cpdr (Esomeprazole magnesium) .... Take 1 tablet by mouth once a day 11)  Omega-3 350 Mg Caps (Omega-3 fatty acids) .... Over the counter as directed on container  Other Orders: T-Hepatic Function [phone]) T-Basic Metabolic Panel [phone]) T-CBC w/Diff [phone]) T-CK Total [id number]) T-Testosterone; Total [phone]) T-PSA [phone])  Patient Instructions: 1)  Please schedule a follow-up appointment in 6 months. Prescriptions: NIASPAN 1000 MG TBCR (NIACIN (ANTIHYPERLIPIDEMIC)) Take 1 tablet by mouth once a day  #90 x 3   Entered and Authorized by:   [name] DO   Signed by:   [name] DO on 10/04/2009   Method used:   Electronically to        [facility]* (retail)             ,          Ph: [phone]       Fax: [phone]   RxID:   [id number] NEXIUM 40 MG  CPDR (ESOMEPRAZOLE MAGNESIUM) Take 1 tablet by  mouth once a day Brand medically necessary #90 x 3   Entered and Authorized by:   [name] DO   Signed by:   [name] DO on 10/04/2009   Method used:   Electronically to        [facility]* (retail)             ,          Ph: [phone]       Fax: [phone]   RxID:   [id number] LEVITRA 20 MG TABS (VARDENAFIL HCL) Take as directed.  #12 Tablet x 5   Entered and Authorized by:   [name] DO   Signed by:   [name] DO on 10/04/2009   Method used:   Electronically to        [facility]* (retail)             ,  Ph: [phone]       Fax: [phone]   RxID:   [id number] PRAVASTATIN SODIUM 40 MG TABS (PRAVASTATIN SODIUM) one by mouth once daily  #90 x 3   Entered and Authorized by:   [name] DO   Signed by:   [name] DO on 10/04/2009   Method used:   Electronically to        [facility]* (retail)             ,          Ph: [phone]       Fax: [phone]   RxID:   [id number] NEXIUM 40 MG  CPDR (ESOMEPRAZOLE MAGNESIUM) Take 1 tablet by mouth once a day Brand medically necessary #90 x 3   Entered and Authorized by:   [name] DO   Signed by:   [name] DO on 10/04/2009   Method used:   [facility]        [facility] [phone][facility] (retail)       [address]       [place], [place]  [address]       Ph: [phone]       Fax: [phone]   RxID:   [id number] NIASPAN 1000 MG TBCR (NIACIN (ANTIHYPERLIPIDEMIC)) Take 1 tablet by mouth once a day  #90 x 3   Entered and Authorized by:   [name] DO   Signed by:   [name] DO on 10/04/2009   Method used:   Electronically to        [facility] [phone]* (retail)       [address]       [place], [place]  [address]       Ph: [phone]       Fax: [phone]   RxID:   [id number] LEVITRA 20 MG TABS (VARDENAFIL HCL) Take as directed.  #12 Tablet x 5   Entered and  Authorized by:   [name] DO   Signed by:   [name] DO on 10/04/2009   Method used:   Electronically to        [facility] [phone]* (retail)       [address]       [place], [place]  [address]       Ph: [phone]       Fax: [phone]   RxID:   [id number]   Current Allergies (reviewed today): No known allergies

## 2010-05-26 NOTE — Letter (Signed)
   [place] [facility] [address] [place], [place]  [address]  [place] Phone: [phone]      October 05, 2009   [facility] [address] [place], [place] [address]  RE:  LAB RESULTS  Dear  Mr. Ealy,  The following is an interpretation of your most recent lab tests.  Please take note of any instructions provided or changes to medications that have resulted from your lab work.  ELECTROLYTES:  Good - no changes needed  KIDNEY FUNCTION TESTS:  Good - no changes needed  LIVER FUNCTION TESTS:  Good - no changes needed  LIPID PANEL:  Stable - no changes needed Triglyceride: 318   Cholesterol: 213   LDL: 98   HDL: 51   Chol/HDL%:  4.2 Ratio  THYROID STUDIES:  Thyroid studies normal TSH: 1.337      CBC:  Good - no changes needed       Sincerely Yours,    Dr. Thomos Lemons

## 2010-05-26 NOTE — Progress Notes (Signed)
Summary: Nexium Refill  Phone Note Refill Request Call back at Home Phone [phone] Message from:  Patient on August 12, 2009 11:24 AM  Refills Requested: Medication #1:  NEXIUM 40 MG  CPDR Take 1 tablet by mouth once a day [BMN]   Supply Requested: 3 months  Method Requested: Electronic Initial call taken by: Lannette Donath,  August 12, 2009 11:25 AM  Follow-up for Phone Call        Rx completed in Dr. Tiajuana Amass Follow-up by: Glendell Docker CMA,  August 12, 2009 11:39 AM    Prescriptions: NEXIUM 40 MG  CPDR (ESOMEPRAZOLE MAGNESIUM) Take 1 tablet by mouth once a day Brand medically necessary #90 x 0   Entered by:   Glendell Docker CMA   Authorized by:   [name] DO   Signed by:   Glendell Docker CMA on 08/12/2009   Method used:   Electronically to        [facility] [phone]* (retail)       [address]       [place], [place]  [address]       Ph: [phone]       Fax: [phone]   RxID:   [id number]

## 2010-06-17 ENCOUNTER — Telehealth: Payer: Self-pay | Admitting: Internal Medicine

## 2010-06-21 ENCOUNTER — Telehealth: Payer: Self-pay | Admitting: Internal Medicine

## 2010-06-27 ENCOUNTER — Encounter: Payer: Self-pay | Admitting: Internal Medicine

## 2010-06-27 ENCOUNTER — Ambulatory Visit (INDEPENDENT_AMBULATORY_CARE_PROVIDER_SITE_OTHER): Payer: Federal, State, Local not specified - PPO | Admitting: Internal Medicine

## 2010-06-27 DIAGNOSIS — E785 Hyperlipidemia, unspecified: Secondary | ICD-10-CM

## 2010-06-30 NOTE — Progress Notes (Signed)
Summary: refill--magic mouthwash  Phone Note Call from Patient Call back at Home Phone [phone]   Caller: Spouse Call For: [name] DO Summary of Call: pt wife called and made appt for pt for 3.5.12. because dr.Deaaron Fulghum said pt needed to be seen for ov. pt wife requesting one refill until pt is seen. has sores in mouth. please assist. Initial call taken by: Gregory Carr,  June 21, 2010 1:51 PM  Follow-up for Phone Call        Please advise if 1 refill can be given or will pt need to be seen first?  Mervin Kung CMA Duncan Dull)  June 21, 2010 2:35 PM   Additional Follow-up for Phone Call Additional follow up Details #1::        ok to refill magic mouthwash x 1 Additional Follow-up by: [name] DO,  June 21, 2010 5:26 PM    Additional Follow-up for Phone Call Additional follow up Details #2::    Left detailed message on pt's home number that 1 refill has been given, need to keep f/u appt and to call if any questions. Nicki Guadalajara Fergerson CMA Duncan Dull)  June 22, 2010 8:19 AM   Prescriptions: MAGIC MOUTH WASH 1 tsp swish and spit QID  #12 oz x 0   Entered by:   Mervin Kung CMA (AAMA)   Authorized by:   [name] DO   Signed by:   Mervin Kung CMA (AAMA) on 06/22/2010   Method used:   Faxed to ...       [facility] [phone]* (retail)       [address]       [place], [place]  [address]       Ph: [phone]       Fax: [phone]   RxID:   [id number]

## 2010-06-30 NOTE — Progress Notes (Signed)
Summary: REFILL  Phone Note Refill Request Message from:  Fax from Pharmacy on June 17, 2010 4:40 PM  Refills Requested: Medication #1:  MAGIC MOUTH WASH 1 tsp swish and spit QID   Dosage confirmed as above?Dosage Confirmed   Brand Name Necessary? No   Supply Requested: 1 month   Last Refilled: 07/20/2009 No appt  on file  Initial call taken by: Darral Dash,  June 17, 2010 4:44 PM  Follow-up for Phone Call        call placed to patient at [phone],no answer. A detailed voice message was left informing patient refill will require office visit Follow-up by: Glendell Docker CMA,  June 21, 2010 9:05 AM

## 2010-07-21 NOTE — Assessment & Plan Note (Signed)
Summary: med refill/ov/ss   Vital Signs:  Patient profile:   60 year old male Height:      65.5 inches Weight:      170.50 pounds BMI:     28.04 O2 Sat:      98 % on Room air Temp:     97.8 degrees F oral Pulse rate:   86 / minute Resp:     18 per minute BP sitting:   138 / 78  (right arm) Cuff size:   regular  Vitals Entered By: Glendell Docker CMA (June 27, 2010 9:42 AM)  O2 Flow:  Room air CC: follow-up visit Is Patient Diabetic? No Pain Assessment Patient in pain? no      Comments c/o urinary frequency for the past couple of years, has not had it addressed, evalutaion of sore on tonuge, fasting labs with psa requested   Primary Care Provider:  [name] DO  CC:  follow-up visit.  History of Present Illness:  Hyperlipidemia Follow-Up      This is a 60 year old man who presents for Hyperlipidemia follow-up.  The patient denies muscle aches and GI upset.  The patient denies the following symptoms: chest pain/pressure.  Dietary and medication compliance has been fair.    int hx: 6 months ago seen by urologist - followed for hx right nephrectomy prostate biopsy performed  Preventive Screening-Counseling & Management  Alcohol-Tobacco     Smoking Status: quit  Allergies (verified): No Known Drug Allergies  Past History:  Past Medical History: ERECTILE DYSFUNCTION (ICD-302.72) ADD (ICD-314.00) - sees psych INSOMNIA (ICD-780.52) - sees psych GERD (ICD-530.81) - sees GI   HYPERLIPIDEMIA (ICD-272.4)  BARRETTS ESOPHAGUS (ICD-530.85) - sees GI glucose intolerance Gilbert's syndrome  Current Problems:  OTHER DECREASED WHITE BLOOD CELL COUNT (ICD-288.59) - sees hematology ELEVATED BP READING WITHOUT DX HYPERTENSION (ICD-796.2) BACK PAIN, CHRONIC (ICD-724.5) - sees neurosurgery and pain management OTHER ANXIETY STATES (ICD-300.09)) - sees psych recurrent bronchitis    Social History: Occupation: Geneticist, molecular  Married with 2 children Daughter  with brain tumor - stage 4   Former smoker quit 1981   Alcohol use-no  Physical Exam  General:  alert, well-developed, and well-nourished.   Lungs:  normal respiratory effort and normal breath sounds.   Heart:  normal rate, regular rhythm, and no gallop.   Extremities:  No lower extremity edema Psych:  mild agitation   Impression & Recommendations:  Problem # 1:  HYPERLIPIDEMIA (ICD-272.4) Assessment Unchanged  His updated medication list for this problem includes:    Pravastatin Sodium 40 Mg Tabs (Pravastatin sodium) ..... One by mouth once daily    Niaspan 1000 Mg Tbcr (Niacin (antihyperlipidemic)) .Marland Kitchen... Take 1 tablet by mouth once a day  Labs Reviewed: SGOT: 30 (10/04/2009)   SGPT: 41 (10/04/2009)  Lipid Goals: Chol Goal: 200 (10/12/2008)   HDL Goal: 40 (10/12/2008)   LDL Goal: 160 (10/12/2008)   TG Goal: 150 (10/12/2008)  Prior 10 Yr Risk Heart Disease: 4 % (10/12/2008)   HDL:51 (10/04/2009), 45 (10/12/2008)  LDL:98 (10/04/2009), 66 (10/12/2008)  Chol:213 (10/04/2009), 168 (10/12/2008)  Trig:318 (10/04/2009), 286 (10/12/2008)  Complete Medication List: 1)  Pravastatin Sodium 40 Mg Tabs (Pravastatin sodium) .... One by mouth once daily 2)  Levitra 20 Mg Tabs (Vardenafil hcl) .... Take as directed. 3)  Niaspan 1000 Mg Tbcr (Niacin (antihyperlipidemic)) .... Take 1 tablet by mouth once a day 4)  Adderall 20 Mg Tabs (Amphetamine-dextroamphetamine) .... Take 1 tablet by mouth three  times a day 5)  Exalgo 8 Mg Xr24h-tab (Hydromorphone hcl) .... Take 1 tablet by mouth two times a day 6)  Lunesta 3 Mg Tabs (Eszopiclone) .... Take one tablet each evening. 7)  Effexor 75 Mg Tabs (Venlafaxine hcl) .... Take one tablet three times a day 8)  Trazodone Hcl 300 Mg Tabs (Trazodone hcl) .... Take one tablet each evening. 9)  Magic Mouth Wash  .... 1 tsp swish and spit qid 10)  Nexium 40 Mg Cpdr (Esomeprazole magnesium) .... Take 1 tablet by mouth once a day 11)  Omega-3 350 Mg Caps  (Omega-3 fatty acids) .... Over the counter as directed on container  Patient Instructions: 1)  Please schedule a follow-up appointment in 6 months. 2)  BMP prior to visit, ICD-9:  272.4 3)  Hepatic Panel prior to visit, ICD-9: 272.4 4)  Lipid Panel prior to visit, ICD-9:272.4 5)  PSA prior to visit, ICD-9: 600.01 6)  Please return for lab work one (1) week before your next appointment.  7)  Use miralax once daily Prescriptions: NEXIUM 40 MG  CPDR (ESOMEPRAZOLE MAGNESIUM) Take 1 tablet by mouth once a day Brand medically necessary #90 x 1   Entered and Authorized by:   [name] DO   Signed by:   [name] DO on 06/27/2010   Method used:   Print then Give to Patient   RxID:   [id number] NIASPAN 1000 MG TBCR (NIACIN (ANTIHYPERLIPIDEMIC)) Take 1 tablet by mouth once a day  #90 x 1   Entered and Authorized by:   [name] DO   Signed by:   [name] DO on 06/27/2010   Method used:   Print then Give to Patient   RxID:   [id number] PRAVASTATIN SODIUM 40 MG TABS (PRAVASTATIN SODIUM) one by mouth once daily  #90 x 1   Entered and Authorized by:   [name] DO   Signed by:   [name] DO on 06/27/2010   Method used:   Print then Give to Patient   RxID:   [id number] LEVITRA 20 MG TABS (VARDENAFIL HCL) Take as directed.  #12 Tablet x 5   Entered and Authorized by:   [name] DO   Signed by:   [name] DO on 06/27/2010   Method used:   Print then Give to Patient   RxID:   [id number]    Orders Added: 1)  Est. Patient Level III [42595]   Immunization History:  Influenza Immunization History:    Influenza:  historical (03/08/2010)   Immunization History:  Influenza Immunization History:    Influenza:  Historical (03/08/2010)  Current Allergies (reviewed today): No known allergies

## 2010-07-28 ENCOUNTER — Other Ambulatory Visit: Payer: Self-pay | Admitting: *Deleted

## 2010-07-28 NOTE — Telephone Encounter (Signed)
Patient wife called and left voice message requesting a refill for generic Nexium to [facility] for a 90 day supply for patient.   Call was returned to patient at [phone], patients wife Dennie Bible answered, she was informed printed  rx's were provided to patient at his last office visit. She states that she forgot about that, and will check with patient. Nothing further to address

## 2010-07-31 LAB — CBC
HCT: 43.1 % (ref 39.0–52.0)
MCHC: 34.6 g/dL (ref 30.0–36.0)
MCV: 94.6 fL (ref 78.0–100.0)
Platelets: 167 10*3/uL (ref 150–400)
Platelets: 180 10*3/uL (ref 150–400)
RBC: 4.77 MIL/uL (ref 4.22–5.81)
RDW: 11.9 % (ref 11.5–15.5)
WBC: 8.4 10*3/uL (ref 4.0–10.5)
WBC: 8.7 10*3/uL (ref 4.0–10.5)

## 2010-07-31 LAB — URINALYSIS, ROUTINE W REFLEX MICROSCOPIC
Bilirubin Urine: NEGATIVE
Bilirubin Urine: NEGATIVE
Glucose, UA: NEGATIVE mg/dL
Ketones, ur: NEGATIVE mg/dL
Ketones, ur: NEGATIVE mg/dL
Nitrite: NEGATIVE
Specific Gravity, Urine: 1.018 (ref 1.005–1.030)
Specific Gravity, Urine: 1.023 (ref 1.005–1.030)
pH: 7.5 (ref 5.0–8.0)
pH: 8 (ref 5.0–8.0)

## 2010-07-31 LAB — URINE MICROSCOPIC-ADD ON

## 2010-07-31 LAB — COMPREHENSIVE METABOLIC PANEL
ALT: 48 U/L (ref 0–53)
AST: 35 U/L (ref 0–37)
AST: 44 U/L — ABNORMAL HIGH (ref 0–37)
Albumin: 4.1 g/dL (ref 3.5–5.2)
Alkaline Phosphatase: 82 U/L (ref 39–117)
BUN: 22 mg/dL (ref 6–23)
CO2: 28 mEq/L (ref 19–32)
Calcium: 9.6 mg/dL (ref 8.4–10.5)
Creatinine, Ser: 1.3 mg/dL (ref 0.4–1.5)
GFR calc Af Amer: >60 mL/min (ref >60)
GFR calc Af Amer: >60 mL/min (ref >60)
GFR calc non Af Amer: >60 mL/min (ref >60)
Potassium: 4.5 mEq/L (ref 3.5–5.1)
Sodium: 144 mEq/L (ref 135–145)
Total Protein: 6.7 g/dL (ref 6.0–8.3)
Total Protein: 7.3 g/dL (ref 6.0–8.3)

## 2010-07-31 LAB — DIFFERENTIAL
Basophils Relative: 0 % (ref 0–1)
Eosinophils Absolute: 0 10*3/uL (ref 0.0–0.7)
Eosinophils Relative: 0 % (ref 0–5)
Eosinophils Relative: 0 % (ref 0–5)
Lymphocytes Relative: 7 % — ABNORMAL LOW (ref 12–46)
Lymphs Abs: 1 10*3/uL (ref 0.7–4.0)
Monocytes Absolute: 0.6 10*3/uL (ref 0.1–1.0)
Monocytes Absolute: 0.7 10*3/uL (ref 0.1–1.0)
Monocytes Relative: 7 % (ref 3–12)
Monocytes Relative: 8 % (ref 3–12)
Neutro Abs: 7.2 10*3/uL (ref 1.7–7.7)

## 2010-07-31 LAB — URINE CULTURE

## 2010-07-31 LAB — HEMOCCULT GUIAC POC 1CARD (OFFICE): Fecal Occult Bld: NEGATIVE

## 2010-08-04 ENCOUNTER — Other Ambulatory Visit: Payer: Self-pay | Admitting: *Deleted

## 2010-08-04 MED ORDER — ESOMEPRAZOLE MAGNESIUM 40 MG PO CPDR: 40.0000 mg | DELAYED_RELEASE_CAPSULE | Freq: Every day | ORAL | Status: DC

## 2010-08-04 NOTE — Telephone Encounter (Signed)
Rx called to [facility].  At [facility] and left message on voicemai to cancel at [facility].

## 2010-08-05 ENCOUNTER — Encounter: Payer: Self-pay | Admitting: *Deleted

## 2010-08-05 NOTE — Telephone Encounter (Signed)
This encounter was created in error - please disregard.

## 2010-09-06 NOTE — Op Note (Signed)
[name], Gregory Carr               ACCOUNT NO.:  [id number]   MEDICAL RECORD NO.:  [id number]          PATIENT TYPE:  INP   LOCATION:  3526                         FACILITY:  [facility]   PHYSICIAN:  Kathaleen Maser. Pool, M.D.    DATE OF BIRTH:  1951-03-09   DATE OF PROCEDURE:  07/16/2007  DATE OF DISCHARGE:                               OPERATIVE REPORT   PREOPERATIVE DIAGNOSIS:  C5-C6 pseudoarthrosis status post C4-C5 and C5-  C6 anterior cervical discectomy and fusion with allograft and plating  and C6-C7 herniated pulposus with radiculopathy.   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   PROCEDURE NOTE:  Re-exploration of C4-C5 and C5-C6 anterior cervical  fusion with removal of C4 to C6 anterior plate instrumentation.  C6-C7  anterior cervical discectomy fusion allograft and plating.  Through a  separate incision, the patient subsequently underwent a C4 to C7  posterior cervical fusion with lateral mass segmental instrumentation  and local bone grafting and bone graft extender.   SURGEON:  Kathaleen Maser. Pool, M.D.   ASSISTANT:  Reinaldo Meeker, M.D.   ANESTHESIA:  General endotracheal anesthesia.   INDICATIONS FOR PROCEDURE:  Gregory Carr is a 60 year old male with a  history of significant neck pain and some radicular symptoms failing  conservative management.  The patient is status post previous C4-C5 and  C5-C6 anterior cervical discectomy and fusion with allograft and  plating.  The patient does have evidence of a symptomatic  pseudoarthrosis at C5-C6.  He also has progressively worsening disc  herniation at C6-C7 with early spinal cord and nerve root compression.  We discussed options for management including both operative and  nonoperative care.  The patient decided to proceed with an anterior-  posterior procedure in hopes of  improving his symptoms.   OPERATIVE NOTE:  The patient was brought to the operating room and  placed on the operating table in a supine position. After a adequate  level of anesthesia was achieved, the patient was positioned supine with  his neck slightly extended and placed in halter traction.  The anterior  cervical region was prepped and draped sterilely.  A 10 blade was used  to make a linear skin incision overlying the C5-C6 interspace.  This was  carried down sharply to the platysma.  The platysma was divided  vertically and dissection proceeded on the medial border of the  sternomastoid muscle and carotid sheath.  The trachea and esophagus were  mobilized and retracted towards the left.  The prevertebral fascia was  stripped off the anterior spinal column.  Previous anterior plating  fixation from C4 to C6 was identified and was then disassembled using  appropriate screwdrivers.  The plate was removed.  The fusion at C4-C5  was explored and found to be solid.  The fusion at C5-C6 was not grossly  unstable but imaging was consistent with pseudoarthrosis.  It was felt  to be low yield to revise the anterior fusion and the plan is  just to  perform a posterior fusion later at this level.  The bone of the body of  C5 and C6 was drilled to partially decorticate it to fully encourage  fusion anteriorly later.  The disc space at C6-C7 was then incised with  a 15 blade in a rectangular fashion.  Wide disc space clean out was then  achieved using pituitary rongeurs, forward and backward Carlens  curettes, Kerrison rongeurs, and a high speed drill.  All elements of  the disc were removed down to the level of the posterior annulus.  The  microscope was then brought to the field and used throughout the  remainder of the discectomy.  The remaining aspects of the annulus and  osteophytes were removed using a high speed drill down to the level of  the posterior longitudinal ligament.  The  posterior longitudinal  ligament was elevated and resected in a piecemeal fashion using Kerrison  rongeurs.  The thecal sac was then identified.  A wide central  decompression was then performed by undercutting bodies of C6 and C7  using Kerrison rongeurs and the underlying thecal sac was identified.  Decompression then proceeded out to each neural foramen.  Wide anterior  foraminotomy was then performed along the course of the exiting C7 nerve  roots bilaterally.  At this point, a very thorough decompression had  been achieved.  All elements of the disc herniation had completely been  resected.  The wound was then irrigated with antibiotic solution.  Gelfoam was placed topically for hemostasis which was found to be good.  A 5 mm Lifenet  allograft wedge was then impacted into place and  recessed approximately 1 mm from the anterior cortical margin.  A 23 mm  Atlantis cervical plate was then placed over the C6 and C7 vertebral  levels.  This was then attached under fluoroscopic guidance using 13 mm  variable screws, two each at both levels.  All four screws were given  final tightening and found to be solid in bone.  Final images revealed  good position of bone grafts and hardware.  The wound was then irrigated  with antibiotic solution.  Hemostasis was ensured with bipolar cautery.  The wound was then closed in layers with Vicryl sutures.  There were no  apparent complications.  The patient tolerated the procedure well and  now was moved from the table and repositioned for his posterior cervical  spine surgery.   The patient is placed on the bolsters with his head fixed in a [place]  pin headrest in a neutral position.  The patient's posterior cervical  region was prepped and draped sterilely.  A 10 blade was used to make a  linear skin incision extending from C4 down to C7.  This was carried  sharply in the midline.  A subperiosteal dissection was performed  exposing the lamina and  facet joints of C4, C5, C6 and C7.  Deep self-  retaining retractor was placed.  Intraoperative fluoroscopy was used and  levels were confirmed.  Entry sites for lateral mass instrumentation  were then made using a high speed drill.  Pilot holes were then drilled  at C4, C5 and C6.  The C7 lateral mass was inspected and found to be  adequate to accept screws bilaterally.  14 mm vertex screws were placed  bilaterally at C4, C5, C6, and C7.  All screws found good purchase and  were solidly within bone.  Short segment titanium rods were then cut and  contoured over the screw heads from C4 down to C7.  Locking caps were  placed over the screw heads.  Locking caps were given final tightening  with the construct under gentle compression.  The facet joints and the  lamina were then decorticated using a high speed drill.  Morselized  autograft mixed with local bone graft extender was packed  posterolaterally for later fusion.  Final images revealed good position  of the bone grafts and hardware.  The wound was then irrigated one final  time with antibiotic solution.  It was then closed in typical fashion.  Steri-Strips and sterile dressings were applied.  There were no  complications.  The patient tolerated the procedure well and he returns  for postoperative care.           ______________________________  Kathaleen Maser. Pool, M.D.     HAP/MEDQ  D:  07/16/2007  T:  07/16/2007  Job:  [id number]

## 2010-09-09 NOTE — Op Note (Signed)
[name], [name]               ACCOUNT NO.:  [id number]   MEDICAL RECORD NO.:  [id number]          PATIENT TYPE:  AMB   LOCATION:  SDS                          FACILITY:  [facility]   PHYSICIAN:  Henry A. Pool, M.D.    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   PROCEDURE NAME:  C4-5, C5-6 anterior cervical diskectomy and fusion with  allograft and plating.   SURGEON:  Kathaleen Maser. Pool, M.D.   ASSISTANT:  Donalee Citrin, M.D.   ANESTHESIA:  General endotracheal.   INDICATIONS:  Mr. Gregory Carr is a 60 year old male with a history of neck  pain with some intermittent upper extremity symptoms failing  conservative measures.  A workup demonstrates evidence of marked  spondylosis at C4-5 and C5-6.  The patient has been counseled as to his  options.  He has decided to proceed with C4-5 and C5-6 anterior cervical  diskectomy and fusion with allograft and plating in hopes of improving  his symptoms.   OPERATIVE NOTE:  The patient was taken to the operating room and placed  on the operating table in a supine position.  After an adequate level of  anesthesia was achieved, the patient was positioned supine with the neck  slightly and held in place with halter traction.  The patient's anterior  cervical region was prepped and sterilely stably.  A #10 blade was used  to make a linear skin incision overlying the C5 vertebral level.  This  was carried sharply to the platysma.  The platysma was then divided  vertically and dissection proceeds along the medial border of the  sternocleidomastoid muscle and carotid sheath.  Trachea and esophagus  were mobilized, retracted toward the left.  Prevertebral fascia was  stripped off the anterior spinal  column.  The longus colli was then  elevated bilaterally using electrocautery.  Deep self-retaining  retractor was placed and intraoperative fluoroscopy used.  The C4-5 and  C5-6 levels were confirmed.  Disk space was incised at both levels with  a 15 blade in a rectangular fashion.  A wide disk space clean-out was  then achieved using pituitary rongeurs, forward and backward-angled  Karlin curettes, Kerrison rongeurs, a high-speed drill.  All elements of  the disk were removed down to the level of the posterior annulus, where  starting first at C4-5 the remaining aspects of the annulus and  osteophytes were removed using a high-speed drill down to the level of  the posterior longitudinal ligament.  The posterior longitudinal  ligament was then elevated and resected in a piecemeal fashion using  Kerrison rongeurs.  A wide central decompression was then performed by  undercutting the bodies of C4 and C5 using Kerrison rongeurs.  Decompression then proceeded out each neural foramen.  A wide anterior  foraminotomy was then performed along the course of the exiting C5 nerve  roots  bilaterally.  The procedure was then repeated at C5-6, again  without complication.  The wound was then irrigated with antibiotic  solution.  Gelfoam was placed topically for hemostasis and found to be  good.  A 6 mm LifeNet allograft wedge was then placed at C4-5 and a 5 mm  wedge was placed at C5-6.  Both grafts were packed in place, recessed  approximately 1 mm from the anterior cortical margin.  A 40 mm anterior  cervical plate was then placed over the C4, C5 and C6 levels.  This was  attached under fluoroscopic guidance using 13 mL variable-angled screws,  two each at all three levels.  A final tightening was given to all the  screws.  The locking screws were engaged.  Final images revealed good  position of the bone grafts and hardware, proper operative level, and  normal alignment of the spine.  The wound was  then irrigated with  antibiotic solution.  Gelfoam was placed topically for hemostasis, which  was found to be good.  The wound was then closed in typical fashion.  Steri-Strips and sterile dressing were applied.  There were no apparent  complications.  The patient tolerated the procedure well, and he returns  to the recovery room postoperatively.           ______________________________  Kathaleen Maser Pool, M.D.     HAP/MEDQ  D:  06/04/2006  T:  06/05/2006  Job:  [id number]

## 2010-09-09 NOTE — Op Note (Signed)
NAME:  JOHNROBERT, FOTI                         ACCOUNT NO.:  [id number]   MEDICAL RECORD NO.:  [id number]                   PATIENT TYPE:  AMB   LOCATION:  ENDO                                 [facility]ACILITY:  [facility]   PHYSICIAN:  Petra Kuba, M.D.                 DATE OF BIRTH:  Nov 05, 1950   DATE OF PROCEDURE:  08/26/2003  DATE OF DISCHARGE:                                 OPERATIVE REPORT   __________   PROCEDURE:  Esophagogastroduodenoscopy with biopsy.   INDICATIONS:  Upper abdominal pain not responding to the usual medicine,  questionable Barrett's in the past.  Consent was signed after risks,  benefits, methods, options were thoroughly discussed in the office.   MEDICATIONS:  Demerol 75 mg, Versed 7.   DESCRIPTION OF PROCEDURE:  The video endoscope was inserted by direct  vision.  The esophagus was normal.  In the distal esophagus was a small  hiatal hernia.  I did not see any signs of Barrett's but at the end of the  procedure we took some biopsies of the GE junction to rule out metaplasia.  The scope passed into the stomach, advanced through a normal antrum, normal  pylorus into a normal duodenal bulb, around the C-loop to a normal second  portion of the duodenum.  The scope was withdrawn back to the bulb and a  good look there was ruled out ulcers and __________.  Scope was withdrawn  back to the stomach and retroflexed.  Annularis, cardia, fundus, lesser and  greater curve were all normal on retroflexed visualization except for the  hiatal hernia being confirmed in the cardia.  Straight visualization of the  stomach did not reveal any additional findings.  He did have some difficulty  holding air, but as long as the stomach was not overfilled, we were able to  get adequate visualization.  Biopsies of the GE junction were taken.  Air  was suctioned.  The scope was then slowly withdrawn.  Again the rest of the  esophagus appeared normal.  Scope was removed.  The patient  tolerated the  procedure well.  There was no obvious immediate complication.   ENDOSCOPIC DIAGNOSES:  1. Small hiatal hernia, doubt Barrett's, status post gastroesophageal     junction and distal esophageal biopsies to rule out metaplasia.  2. Otherwise within normal esophagogastroduodenoscopy.   PLAN:  Followup with CT scan next.  In the meantime, see how his b.i.d. pump  inhibitors work.  Call me p.r.n.  Otherwise will call to check on him in a  week, review the biopsies and then decide further work-up plans at that  juncture.                                               Lavada Mesi  Magod, M.D.    MEM/MEDQ  D:  08/26/2003  T:  08/26/2003  Job:  [id number]

## 2010-09-09 NOTE — Op Note (Signed)
[place]. [facility]  Patient:    Gregory Carr, Gregory Carr                    MRN: [id number] Proc. Date: 09/26/99 Attending:  Katy Fitch. Naaman Plummer., M.D. CC:         Katy Fitch. Sypher, Montez Hageman., M.D. (2)                           Operative Report  PREOPERATIVE DIAGNOSIS: 1. Status post glass laceration from broken aquarium to volar aspect of the    left wrist with a 5 cm oblique laceration with laceration of the flexor    carpi radialis tendon visible within the wound with distal stump visible    and proximal stump retracted. 2. 3 cm transverse laceration and proximal wrist flexion.  POSTOPERATIVE DIAGNOSIS: 1. Status post glass laceration from broken aquarium to volar aspect of the    left wrist with a 5 cm oblique laceration with laceration of the flexor    carpi radialis tendon visible within the wound with distal stump visible    and proximal stump retracted. 2. 3 cm transverse laceration and proximal wrist flexion.  OPERATION PERFORMED:  Repair of flexor carpi radialis and repair of two wounds, one 5 cm in length and the other 3 cm in length.  SURGEON:  Katy Fitch. Naaman Plummer., M.D.  ANESTHESIA:  General orotracheal.  SUPERVISING ANESTHESIOLOGIST:  ____________  INDICATIONS FOR PROCEDURE:  The patient is a 60 year old man who is left hand dominant.  Earlier this evening while he was throwing out an aquarium, he accidentally broke the glass aquarium while putting it in a trash can.  A shard of glass impaled his left wrist sustaining two deep lacerations.  He was brought to the [facility] emergency room where his wounds were inspected and found to show an obvious laceration of the flexor carpi radialis tendon.  He had normal sensibility in his fingers and the thumb.  He was able to flex the fingers and the thumb.  A hand surgery consult was requested.  Clinical examination revealed the flexor carpi radialis laceration.  He was prepared for  surgery.  DESCRIPTION OF PROCEDURE:  Gregory Carr was brought to the operating room and placed in supine position on the operating table.  Following induction of general orotracheal anesthesia, the left arm was prepped with Betadine soap and solution and sterilely draped.  Following exsanguination of the limb with an Esmarch bandage, the arterial tourniquet was inflated to 220 mmHg.  The procedure commenced with thorough irrigation and debridement of the wound.  The wound was searched for glass foreign bodies.  None were identified.  The wound was extended proximally 15 mm to identify the sheath of the flexor carpi radialis.  A small hemostat was used to probe the proximal flexor carpi radialis sheath and the tendon was delivered without difficulty.  This was held by the assistant with a Gregory Carr forceps while a 3-0 Ethibond suture was woven in to the proximal stump with grasping technique.  The wrist was flexed and the tendon ends approximated anatomically.  The distal stump was then secured with another grasping suture of 3-0 Ethibond followed by repair of the two skin wounds with intradermal 3-0 Prolene and Steri-Strips.  The wounds were dressed with sterile gauze, sterile Kerlix, Webril and a dorsal plaster splint maintaining the wrist in 20 degrees palmar flexion.  The patient will  be discharged to the care of his wife this evening.  He was given prescriptions for doxycycline 100 mg p.o. q.d. x 7 days in an effort to prevent an atypical infection from the aquarium glass.  He was also given Darvocet-N 100 one or two tablets p.o. q.4-6h. p.r.n. pain, 24 tablets with one refill and Motrin 600 mg 1 p.o. q.6h. p.r.n. pain.  He will return to our office for interval follow-up in one week. DD:  09/26/99 TD:  09/29/99 Job: [id number]

## 2010-09-09 NOTE — Procedures (Signed)
[place]. [facility]  Patient:    Gregory Carr, Gregory Carr Visit Number: [id number] MRN: [id number]          Service Type: END Location: ENDO Attending Physician:  Nelda Marseille Dictated by:   Petra Kuba, M.D. Proc. Date: 03/11/01 Admit Date:  03/11/2001   CC:         Caryn Bee C. Sydnee Levans, M.D.  Bertram Millard. Dahlstedt, M.D.   Procedure Report  PROCEDURE PERFORMED:  Colonoscopy with biopsy.  ENDOSCOPIST:    Petra Kuba, M.D.  INDICATIONS FOR PROCEDURE:  Screening in a patient with history of kidney cancer.  Consent was signed after risks, benefits, methods, and options were thoroughly discussed in the office.  MEDICATIONS USED:  Demerol 130 mg, Versed 12.5 mg.  DESCRIPTION OF PROCEDURE:  Rectal inspection was pertinent for external hemorrhoids small.  Digital exam was negative.  The video colonoscope, video pediatric adjustable colonoscope was inserted and fairly easily advanced around the colon to the cecum.  This did require rolling him on his back and some minimal abdominal pressure.  The cecum was identified by the appendiceal orifice and the ileocecal valve.  In fact the scope was inserted a short ways in the terminal ileum which was normal.  Photodocumentation was obtained.  The scope was then slowly withdrawn.  The prep was fairly adequate.  There was a fair amount of liquid stool that required washing and suctioning but on slow withdrawal through the colon, the cecum, ascending and transverse was normal. As the scope was withdrawn around the splenic flexure, a small middescending polyp was seen and cold biopsied x 2.  The scope was slowly withdrawn back to the rectum.  In the midsigmoid another tiny polyp was seen and was cold biopsied x 1 and put in the same container.  No other abnormalities were seen. Once back in the rectum the scope was retroflexed, pertinent for some internal hemorrhoids.  The scope was straightened and readvanced a  short ways up the left side of the colon.  Air was suctioned, scope removed.  The patient tolerated the procedure well.  There was no obvious immediate complication.  ENDOSCOPIC DIAGNOSIS: 1. Small internal and external hemorrhoids. 2. Two tiny sigmoid and descending polyps cold biopsied. 3. Otherwise within normal limits to the terminal ileum.  PLAN:  Await pathology, probably recheck colon screening in five years, continue workup with an EGD.  Please see that dictation for further work-up plans and recommendations. Dictated by:   Petra Kuba, M.D. Attending Physician:  Nelda Marseille DD:  03/11/01 TD:  03/11/01 Job: [id number]

## 2010-09-09 NOTE — Op Note (Signed)
[place]. [facility]  Patient:    Gregory Carr, Gregory Carr                      MRN: [id number] Adm. Date:  [id number] Disc. Date: [id number] Attending:  Charlton Haws CC:         [facility]; Dr. Ronald Lobo                           Operative Report  PREOPERATIVE DIAGNOSIS:  Right inguinal hernia.  POSTOPERATIVE DIAGNOSIS: Right inguinal hernia.  OPERATIVE PROCEDURE:  Repair of right inguinal hernia.  SURGEON:  Currie Paris, M.D.  ANESTHESIA:  MAC.  INDICATIONS:  This patient is a 60 year-old male with a symptomatic right inguinal hernia that he desired to have repaired.  PROCEDURE IN DETAIL:  The patient was brought in the operating room and, having identified the appropriate side in the preoperative holding area, he was given IV sedation and the area was shaved and prepped and draped.  A combination of 1% xylocaine mixed equally with 0.5% marcaine with epinephrine was used for local and infiltrated along the skin incision and above the anterior superior iliac spine.  The incision was made and deepened through the external oblique aponeurosis and additional local was infiltrated as needed. The external oblique was opened in line of its fibers into the superficial ring.  The cord was dissected up off the inguinal floor.  There was a small indirect defect present, as well as some preperitoneal fat herniating through the deep ring.  The fat was amputated.  The ilioinguinal nerve was kept with the cord.  I used a small mesh plug to occlude the deep ring and held it in place with 2-0 Prolene.  The mesh patch was then laid in as an onlay and sutured in with 2-0 Prolene inferiorly and tacked medially with 2-0 Prolene. Everything appeared to be dry.  Additional local was infiltrated and the incision was closed using 3-0 Vicryl to close the external oblique over the repair, 3-0 Vicryl on Scarpas, and 4-0 Monocryl subcuticular posterior  and superior.  The patient tolerated the procedure well.  There were no operative complications.  All counts were correct. DD:  11/11/99 TD:  11/13/99 Job: [id number]

## 2010-09-09 NOTE — Procedures (Signed)
[place]. [facility]  Patient:    Gregory Carr, Gregory Carr Visit Number: [id number] MRN: [id number]          Service Type: Attending:  Petra Kuba, M.D. Dictated by:   Petra Kuba, M.D. Proc. Date: 03/11/01   CC:         Lilyan Punt. Sydnee Levans, M.D.  Bertram Millard. Dahlstedt, M.D.   Procedure Report  PROCEDURE:  Esophagogastroduodenoscopy with biopsy.  INDICATION:  Patient with persistent upper tract symptoms.  Want to rule out any significant abnormality.  Consent was signed prior to any premedications given after risks, benefits, methods, and options thoroughly discussed multiple times in the past.  ADDITIONAL MEDICATIONS:  Versed 2.5 mg only.  DESCRIPTION OF PROCEDURE:  The video endoscope was inserted by direct vision. The esophagus was normal.  In the distal esophagus was a small hiatal hernia but no signs of obvious Barretts, although the cardial folds did extend a touch above the GE junction.  The scope was inserted into the stomach, where some mild antritis was seen and advanced through a normal pylorus into a normal duodenal bulb and around the C-loop to a normal second portion of the duodenum.  The scope was withdrawn back to the bulb, and a good look there was normal.  The scope was withdrawn back to the stomach and retroflexed.  Cardia, fundus, angularis, lesser and greater curve were evaluated on retroflex and then straight visualization and other than the mild antritis-gastritis mentioned above, no additional findings were seen.  A few scattered biopsies of the antrum and of the proximal stomach were obtained to rule out Helicobacter.  Air was suctioned.  We did take some biopsies of just above the Z-line just to rule out any microscopic Barretts, although there were some leaves of gastric mucosa that seemed to migrate slightly above the Z-line, did not seem to be compatible with Barretts, and the biopsies were obtained to confirm that.  The scope was  then slowly withdrawn.  Again the rest of the esophagus was normal.  Scope was removed.  The patient tolerated the procedure well.  There was no obvious immediate complication.  ENDOSCOPIC DIAGNOSES: 1. Small hiatal hernia, doubt very short-sement Barretts, status post biopsy    to rule out. 2. Mild antritis, gastritis, status post biopsy. 3. Otherwise normal EGD.  PLAN:  Await pathology.  Continue pump inhibitors.  Return care to Dr. Sydnee Levans.  Will recheck his CT scan to make sure hemangioma is not in the findings just to make sure no further workup plans are needed, and otherwise happy to see back p.r.n. Dictated by:   Petra Kuba, M.D. Attending:  Petra Kuba, M.D. DD:  03/11/01 TD:  03/11/01 Job: [id number]

## 2010-09-30 ENCOUNTER — Telehealth: Payer: Self-pay | Admitting: Internal Medicine

## 2010-09-30 MED ORDER — NIACIN ER (ANTIHYPERLIPIDEMIC) 1000 MG PO TBCR: 1000.0000 mg | EXTENDED_RELEASE_TABLET | Freq: Every day | ORAL | Status: DC

## 2010-09-30 NOTE — Telephone Encounter (Signed)
Refill- niaspan er tab 1000mg . Take 1 tablet daily. Qty 90.

## 2010-10-27 ENCOUNTER — Telehealth: Payer: Self-pay | Admitting: Internal Medicine

## 2010-10-27 MED ORDER — PRAVASTATIN SODIUM 40 MG PO TABS: 40.0000 mg | ORAL_TABLET | Freq: Every day | ORAL | Status: DC

## 2010-10-27 NOTE — Telephone Encounter (Signed)
Rx refill sent to pharmacy. 

## 2010-10-27 NOTE — Telephone Encounter (Signed)
New rx- pravastatin 40mg  tab.

## 2011-01-03 ENCOUNTER — Telehealth: Payer: Self-pay | Admitting: Internal Medicine

## 2011-01-03 NOTE — Telephone Encounter (Signed)
New rx request  - pravastatin 40mg  tab.   -niaspan er 1000mg  tab.  -nexium 40mg  cap.

## 2011-01-04 NOTE — Telephone Encounter (Signed)
Pt was advised to make 6 months follow up with labs at last OV in 06/2010.   Ok to refill x 1 only.   Pt needs f/u labs and ov before any additional refills.

## 2011-01-05 MED ORDER — NIACIN ER (ANTIHYPERLIPIDEMIC) 1000 MG PO TBCR: 1000.0000 mg | EXTENDED_RELEASE_TABLET | Freq: Every day | ORAL | Status: DC

## 2011-01-05 MED ORDER — PRAVASTATIN SODIUM 40 MG PO TABS: 40.0000 mg | ORAL_TABLET | Freq: Every day | ORAL | Status: DC

## 2011-01-05 MED ORDER — ESOMEPRAZOLE MAGNESIUM 40 MG PO CPDR: 40.0000 mg | DELAYED_RELEASE_CAPSULE | Freq: Every day | ORAL | Status: DC

## 2011-01-05 NOTE — Telephone Encounter (Signed)
rx sent in electronically, pt needs appt

## 2011-01-06 NOTE — Telephone Encounter (Signed)
Left message for pt that he needed to schedule office visit.

## 2011-01-11 ENCOUNTER — Ambulatory Visit (HOSPITAL_BASED_OUTPATIENT_CLINIC_OR_DEPARTMENT_OTHER)
Admission: RE | Admit: 2011-01-11 | Discharge: 2011-01-11 | Disposition: A | Discharge status: Home / Self Care | Payer: Federal, State, Local not specified - PPO | Source: Ambulatory Visit | Attending: Family | Admitting: Family

## 2011-01-11 ENCOUNTER — Ambulatory Visit (INDEPENDENT_AMBULATORY_CARE_PROVIDER_SITE_OTHER): Payer: Federal, State, Local not specified - PPO | Admitting: Family

## 2011-01-11 ENCOUNTER — Encounter: Payer: Self-pay | Admitting: Family

## 2011-01-11 DIAGNOSIS — R079 Chest pain, unspecified: Secondary | ICD-10-CM

## 2011-01-11 DIAGNOSIS — M5380 Other specified dorsopathies, site unspecified: Secondary | ICD-10-CM | POA: Insufficient documentation

## 2011-01-11 DIAGNOSIS — R0789 Other chest pain: Secondary | ICD-10-CM

## 2011-01-11 DIAGNOSIS — R071 Chest pain on breathing: Secondary | ICD-10-CM | POA: Insufficient documentation

## 2011-01-11 DIAGNOSIS — R030 Elevated blood-pressure reading, without diagnosis of hypertension: Secondary | ICD-10-CM

## 2011-01-11 MED ORDER — IOHEXOL 350 MG/ML SOLN: 80.0000 mL | Freq: Once | INTRAVENOUS | Status: AC | PRN

## 2011-01-11 MED ORDER — NEBIVOLOL HCL 5 MG PO TABS: 5.0000 mg | ORAL_TABLET | Freq: Every day | ORAL | Status: DC

## 2011-01-11 NOTE — Progress Notes (Signed)
  Subjective:    Patient ID: Gregory Carr, male    DOB: 1951-04-03, 60 y.o.   MRN: [id number]  HPI  Gregory Carr is a 60 yr old male who presents today to discuss his high blood pressure.  He has a history of borderline elevated blood pressure never treated with medication.  He reports that last night he had  BP up to 170/97.  He notes associated chest pain.  Chest pain- notes that he has intermittent chest pain when breathing in in the middle of the chest. This started several weeks ago while he was in [place].  Pain does not happen with exercise (works out at [employer]) Denies CP with exercise.  He does report some cough and occasional sensation of shortness or breath. He denies associated calf pain.  He does report + history of GERD symptoms.  Had cardiac eval about 10 yrs ago under Dr. Randa Evens which he reports was negative.        Review of Systems see HPI    No past medical history on file.  History   Social History  . Marital Status: Married    Spouse Name: N/A    Number of Children: N/A  . Years of Education: N/A   Occupational History  . Not on file.   Social History Main Topics  . Smoking status: Former Smoker    Types: Cigarettes  . Smokeless tobacco: Never Used  . Alcohol Use: Not on file  . Drug Use: Not on file  . Sexually Active: Not on file   Other Topics Concern  . Not on file   Social History Narrative  . No narrative on file    No past surgical history on file.  No family history on file.  No Known Allergies  Current Outpatient Prescriptions on File Prior to Visit  Medication Sig Dispense Refill  . esomeprazole (NEXIUM) 40 MG capsule Take 1 capsule (40 mg total) by mouth daily.  90 capsule  0  . niacin (NIASPAN) 1000 MG CR tablet Take 1 tablet (1,000 mg total) by mouth daily.  90 tablet  0  . pravastatin (PRAVACHOL) 40 MG tablet Take 1 tablet (40 mg total) by mouth daily.  90 tablet  0    BP 142/98  Pulse 90  Temp(Src) 97.9 F (36.6 C)  (Oral)  Resp 16  Wt 173 lb (78.472 kg)  SpO2 96%    Objective:   Physical Exam  Constitutional: He appears well-developed and well-nourished.  HENT:  Head: Normocephalic and atraumatic.  Neck: Neck supple.  Cardiovascular: Normal rate and regular rhythm.   No murmur heard. Pulmonary/Chest: Effort normal and breath sounds normal. No respiratory distress. He has no wheezes. He has no rales. He exhibits no tenderness.  Abdominal: Soft. Bowel sounds are normal. He exhibits no distension and no mass. There is no tenderness. There is no rebound and no guarding.  Psychiatric: His behavior is normal. Judgment and thought content normal.       Nervous appearing but pleasant.          Assessment & Plan:  150/85 follow up BP check.

## 2011-01-11 NOTE — Assessment & Plan Note (Addendum)
EKG performed today notes RBBB.  This is new compared to EKG from June 2011.  Reviewed EKG with Dr. Sharrell Ku (cardiology). He did not feel that there was any sign of ischemia.  Will plan to send for a CTA chest today to rule out PE given recent travel and o2 sat of 96%. Pt is agreeable.

## 2011-01-11 NOTE — Patient Instructions (Signed)
Please follow up in 1 week for a blood pressure check.   Complete your CT scan on the first floor this evening.  Go to the ER if you develop worsening chest pain or shortness of breath.

## 2011-01-11 NOTE — Assessment & Plan Note (Addendum)
BP Readings from Last 3 Encounters:  01/11/11 142/98  06/27/10 138/78  10/04/09 118/84   Will start bystolic. Pt will follow up when he returns from vacation in mid October.

## 2011-01-12 ENCOUNTER — Ambulatory Visit: Payer: Federal, State, Local not specified - PPO | Admitting: Internal Medicine

## 2011-01-13 LAB — DIFFERENTIAL
Eosinophils Absolute: 0
Lymphocytes Relative: 21
Lymphs Abs: 1.4
Neutrophils Relative %: 70

## 2011-01-13 LAB — BASIC METABOLIC PANEL
BUN: 12
Creatinine, Ser: 0.94
GFR calc non Af Amer: >60

## 2011-01-13 LAB — CBC
Platelets: 215
WBC: 6.6

## 2011-01-13 LAB — TYPE AND SCREEN
ABO/RH(D): A POS
Antibody Screen: NEGATIVE

## 2011-01-16 ENCOUNTER — Telehealth: Payer: Self-pay | Admitting: Internal Medicine

## 2011-01-16 ENCOUNTER — Telehealth: Payer: Self-pay | Admitting: Family

## 2011-01-16 DIAGNOSIS — R0789 Other chest pain: Secondary | ICD-10-CM

## 2011-01-16 LAB — CBC
MCHC: 35.4
Platelets: 188
RBC: 4.74
WBC: 4.9

## 2011-01-16 LAB — DIFFERENTIAL
Eosinophils Absolute: 0
Eosinophils Relative: 1
Lymphocytes Relative: 28
Lymphs Abs: 1.4
Monocytes Absolute: 0.5
Monocytes Relative: 10

## 2011-01-16 NOTE — Telephone Encounter (Signed)
CT chest was negative.  Per discussion with pt during his visit, will refer for stress test to further evaluate his atypical chest pain.

## 2011-01-16 NOTE — Telephone Encounter (Signed)
Open in error

## 2011-01-19 ENCOUNTER — Other Ambulatory Visit: Payer: Self-pay | Admitting: Internal Medicine

## 2011-01-24 ENCOUNTER — Telehealth: Payer: Self-pay | Admitting: *Deleted

## 2011-01-24 NOTE — Telephone Encounter (Signed)
Patients wife called and left voice message stating they are out of town in [place], and she would like to know if the patient could get a Rx called to [facility] [phone] for [employer].

## 2011-01-25 MED ORDER — DIPHENHYD-HYDROCORT-NYSTATIN MT SUSP: OROMUCOSAL | Status: DC

## 2011-01-25 NOTE — Telephone Encounter (Signed)
[facility] refill [facility] in [facility]. Call placed to patient at [phone], no answer. A detailed voice message was left informing patient Rx sent to pharmacy .

## 2011-01-25 NOTE — Telephone Encounter (Signed)
ok 

## 2011-01-30 ENCOUNTER — Encounter (HOSPITAL_COMMUNITY): Payer: Federal, State, Local not specified - PPO | Admitting: Radiology

## 2011-02-03 ENCOUNTER — Encounter: Payer: Self-pay | Admitting: Family

## 2011-02-21 ENCOUNTER — Other Ambulatory Visit: Payer: Self-pay | Admitting: Internal Medicine

## 2011-02-22 NOTE — Telephone Encounter (Signed)
Rx refill denied office visit needed. 

## 2011-03-04 ENCOUNTER — Other Ambulatory Visit: Payer: Self-pay | Admitting: Internal Medicine

## 2011-03-06 NOTE — Telephone Encounter (Signed)
Rx refills denied office visit needed. 

## 2011-03-24 ENCOUNTER — Ambulatory Visit (INDEPENDENT_AMBULATORY_CARE_PROVIDER_SITE_OTHER): Payer: Federal, State, Local not specified - PPO | Admitting: Internal Medicine

## 2011-03-24 ENCOUNTER — Encounter: Payer: Self-pay | Admitting: Internal Medicine

## 2011-03-24 ENCOUNTER — Telehealth: Payer: Self-pay | Admitting: Internal Medicine

## 2011-03-24 DIAGNOSIS — R739 Hyperglycemia, unspecified: Secondary | ICD-10-CM

## 2011-03-24 DIAGNOSIS — E785 Hyperlipidemia, unspecified: Secondary | ICD-10-CM

## 2011-03-24 DIAGNOSIS — E739 Lactose intolerance, unspecified: Secondary | ICD-10-CM

## 2011-03-24 DIAGNOSIS — R7309 Other abnormal glucose: Secondary | ICD-10-CM

## 2011-03-24 DIAGNOSIS — I10 Essential (primary) hypertension: Secondary | ICD-10-CM

## 2011-03-24 HISTORY — DX: Essential (primary) hypertension: I10

## 2011-03-24 LAB — LIPID PANEL
HDL: 43 mg/dL (ref >39)
Total CHOL/HDL Ratio: 8.1 Ratio

## 2011-03-24 LAB — BASIC METABOLIC PANEL
CO2: 29 mEq/L (ref 19–32)
Calcium: 9.1 mg/dL (ref 8.4–10.5)
Chloride: 102 mEq/L (ref 96–112)
Creat: 1.01 mg/dL (ref 0.50–1.35)
Glucose, Bld: 99 mg/dL (ref 70–99)

## 2011-03-24 LAB — CBC
Hemoglobin: 16.6 g/dL (ref 13.0–17.0)
MCH: 32 pg (ref 26.0–34.0)
MCV: 93.6 fL (ref 78.0–100.0)
Platelets: 237 10*3/uL (ref 150–400)
RBC: 5.18 MIL/uL (ref 4.22–5.81)
WBC: 5.9 10*3/uL (ref 4.0–10.5)

## 2011-03-24 LAB — HEMOGLOBIN A1C: Hgb A1c MFr Bld: 5.9 % — ABNORMAL HIGH (ref <5.7)

## 2011-03-24 LAB — HEPATIC FUNCTION PANEL
AST: 25 U/L (ref 0–37)
Albumin: 4.4 g/dL (ref 3.5–5.2)
Bilirubin, Direct: 0.1 mg/dL (ref 0.0–0.3)
Total Bilirubin: 0.6 mg/dL (ref 0.3–1.2)

## 2011-03-24 NOTE — Assessment & Plan Note (Signed)
Current good control. Low salt diet and exercise. Monitor bp.

## 2011-03-24 NOTE — Assessment & Plan Note (Signed)
Off statin. Obtain lipid/lft. Low fat diet and exercise

## 2011-03-24 NOTE — Progress Notes (Signed)
  Subjective:    Patient ID: Gregory Carr, male    DOB: Feb 16, 1951, 60 y.o.   MRN: [id number]  HPI Pt presents to clinic for followup of multiple medical problems. Off bystolic without side effects. BP nl today and yesterday. H/o Barrett's esophagous and states regular surveillance per GI. Previously on statin tx without myalgias or abn lft. Off statin now without side effect. No active complaint.    Review of Systems see hpi     Objective:   Physical Exam  Physical Exam  Nursing note and vitals reviewed. Constitutional: Appears well-developed and well-nourished. No distress.  HENT:  Head: Normocephalic and atraumatic.  Right Ear: External ear normal.  Left Ear: External ear normal.  Eyes: Conjunctivae are normal. No scleral icterus.  Neck: Neck supple. Carotid bruit is not present.  Cardiovascular: Normal rate, regular rhythm and normal heart sounds.  Exam reveals no gallop and no friction rub.   No murmur heard. Pulmonary/Chest: Effort normal and breath sounds normal. No respiratory distress. He has no wheezes. no rales.  Lymphadenopathy:    He has no cervical adenopathy.  Neurological:Alert.  Skin: Skin is warm and dry. Not diaphoretic.  Psychiatric: Has a normal mood and affect.        Assessment & Plan:

## 2011-03-24 NOTE — Patient Instructions (Signed)
Please schedule chem7, a1c hyperglycemia and lipid/lft 272.4 prior to next visit

## 2011-03-24 NOTE — Assessment & Plan Note (Signed)
Obtain chem7 and a1c 

## 2011-03-24 NOTE — Telephone Encounter (Signed)
Lab orders entered for May 2013 

## 2011-04-01 IMAGING — CT CT ABDOMEN W/ CM
3 of 9 series · 12 of 46 positions shown, 17 images · IV contrast (APPLIED)
Comparison: CT 06/12/2007.

CT ABDOMEN

CLINICAL DATA: Right back pain.  History of renal cell cancer with
left nephrectomy.

CT  ABDOMEN AND PELVIS WITH CONTRAST
TECHNIQUE: Multidetector CT imaging of the chest, abdomen and
pelvis was performed following the standard protocol during bolus
administration of intravenous contrast.
Contrast: 75 ml Emnipaque-1MM IV.

[Series 5: abd/pelvis 3.0 coronal · coronal · 0.78mm/px · 3 of 94 slices shown]
[im 32/94  soft-tissue]
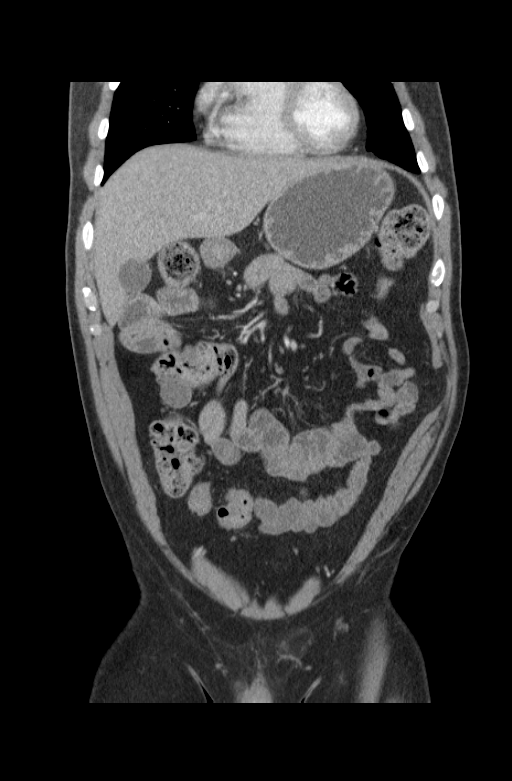
[im 42/94  soft-tissue]
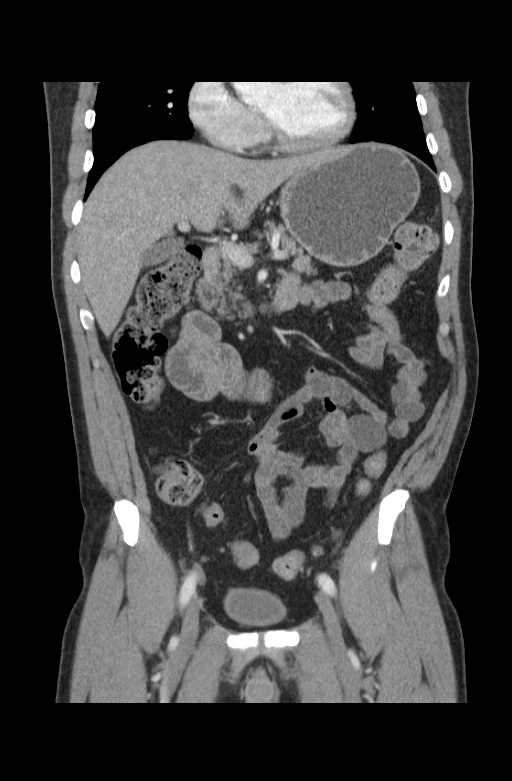
[im 52/94  soft-tissue]
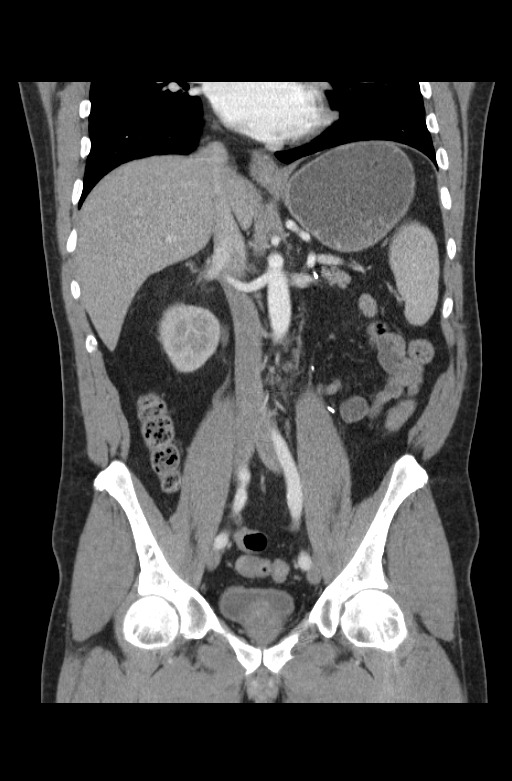

[Series 6: abd/pelvis 3.0 sagittal · sagittal · 0.75mm/px · 1 of 114 slices shown]
[im 38/114  soft-tissue]
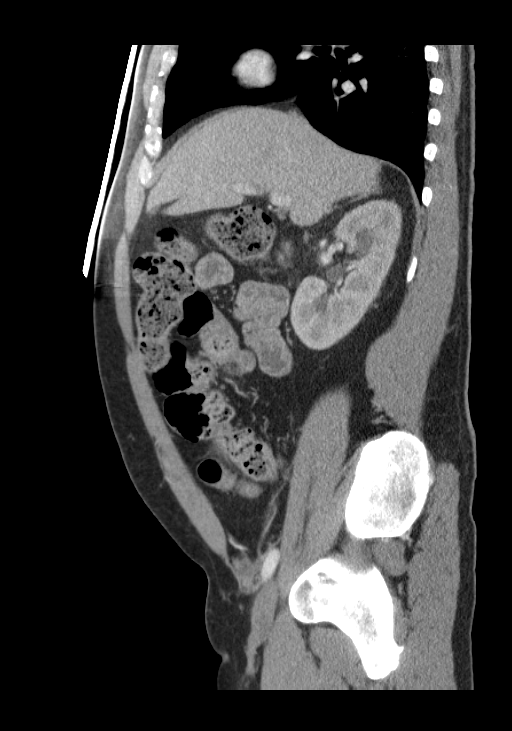

[Series 101: tmp abd/pelvis rtd · axial · 0.71mm/px · z∈[+762,+1134]mm · 8 of 121 slices shown, 13 images]
[im 14/121  soft-tissue]
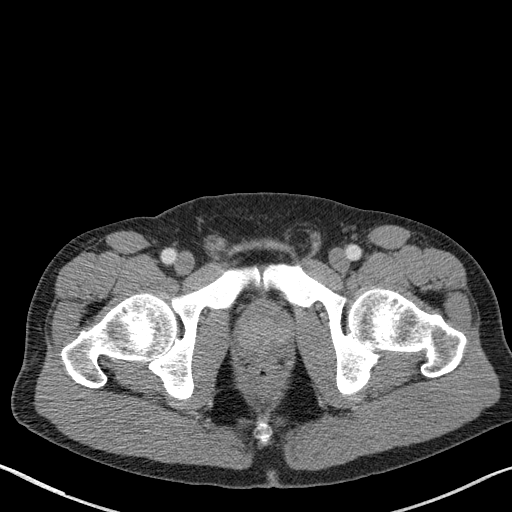
[im 14/121  bone]
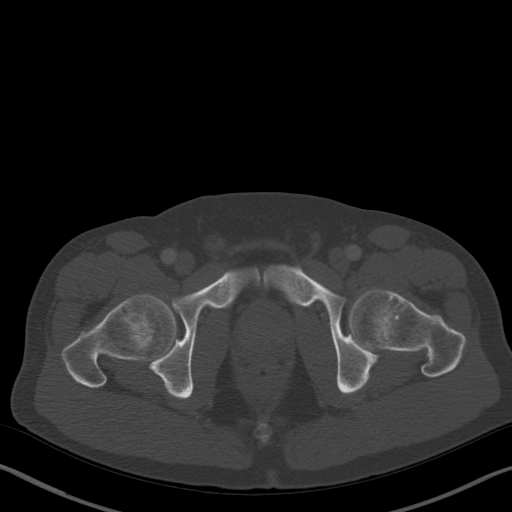
[im 27/121  soft-tissue]
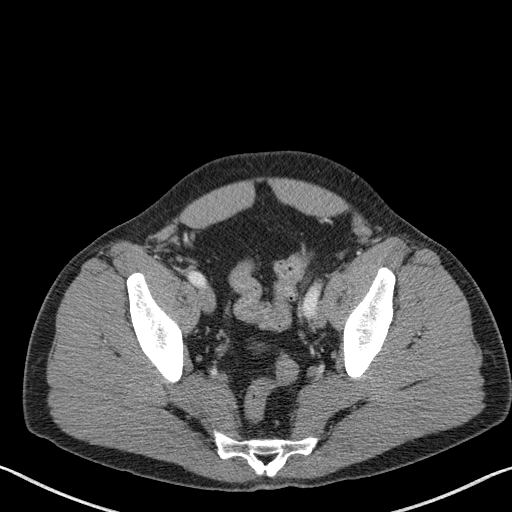
[im 41/121  soft-tissue]
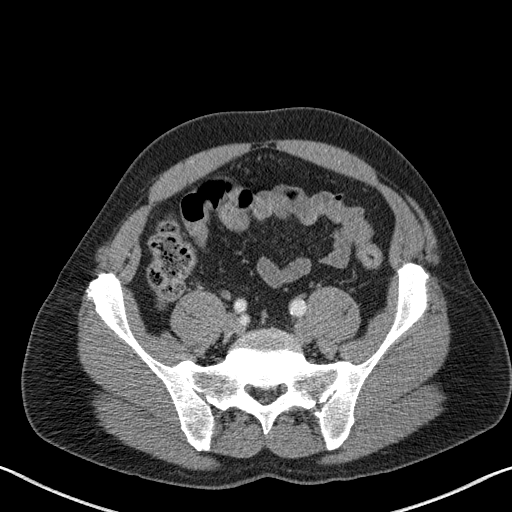
[im 54/121  soft-tissue]
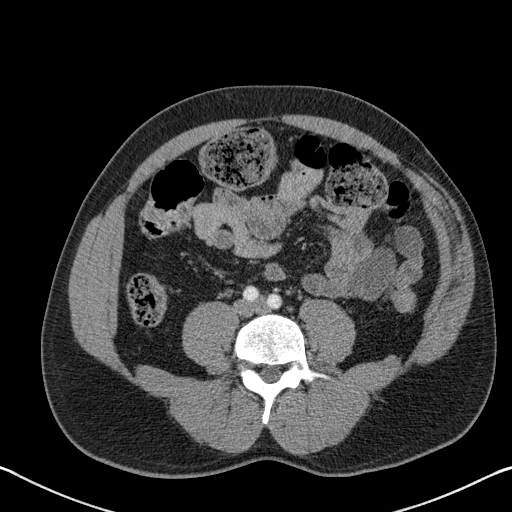
[im 67/121  soft-tissue]
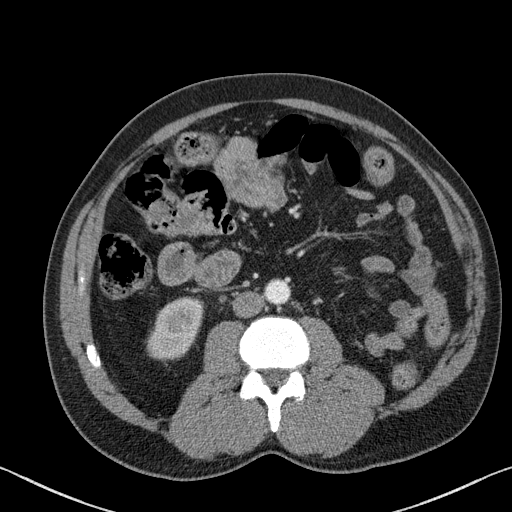
[im 67/121  lung]
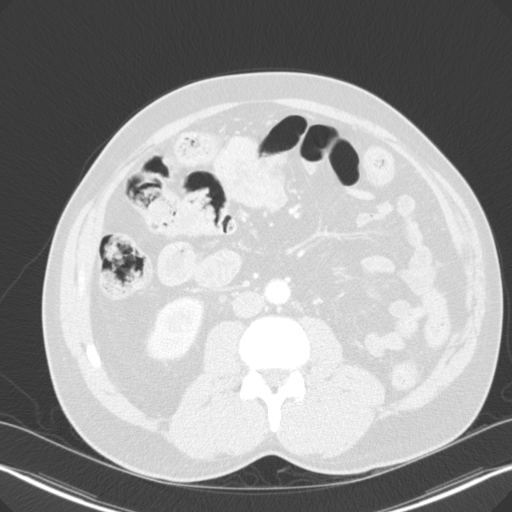
[im 81/121  soft-tissue]
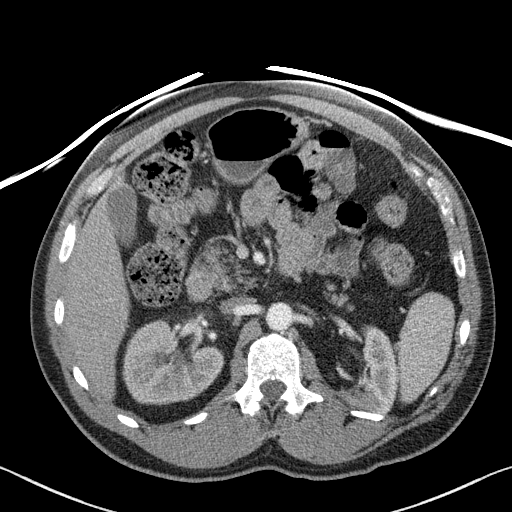
[im 81/121  lung]
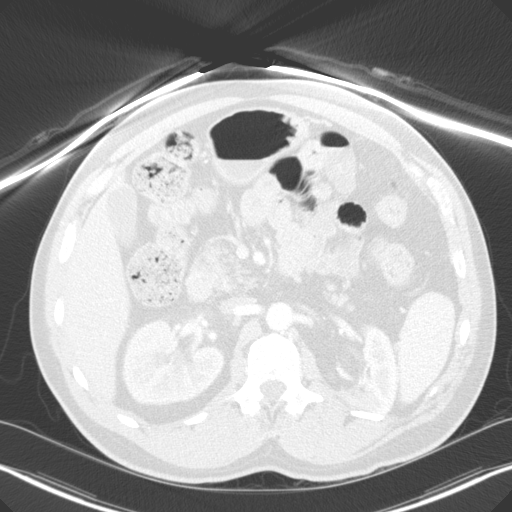
[im 94/121  soft-tissue]
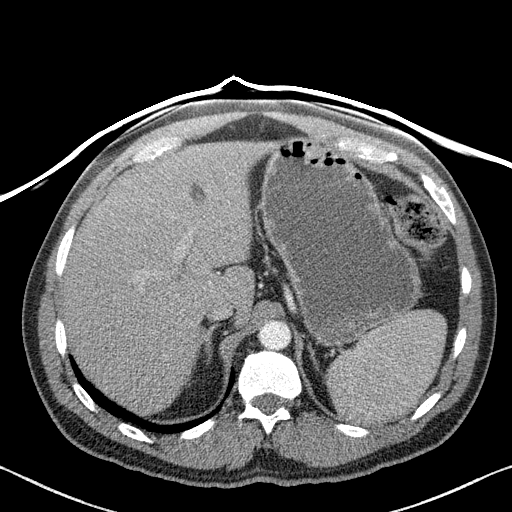
[im 94/121  lung]
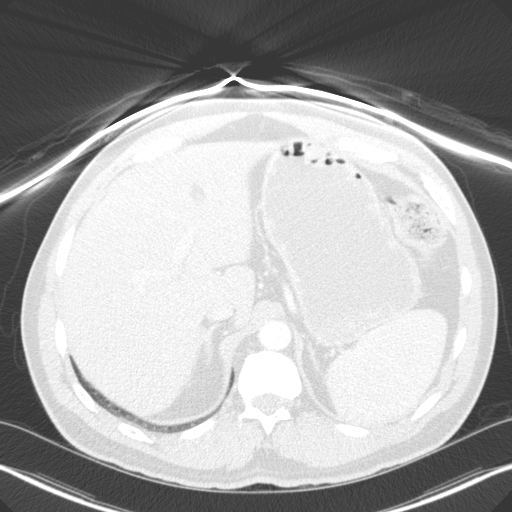
[im 107/121  soft-tissue]
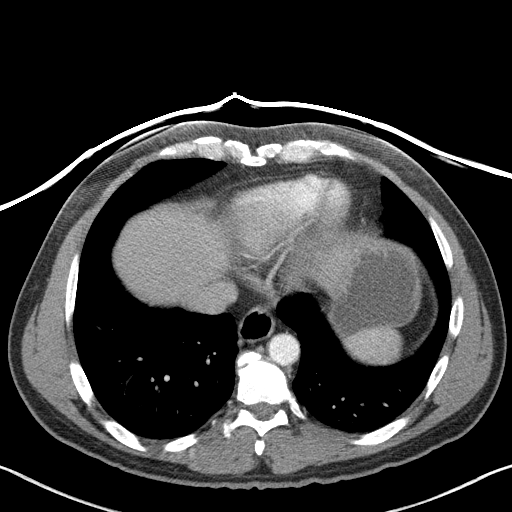
[im 107/121  lung]
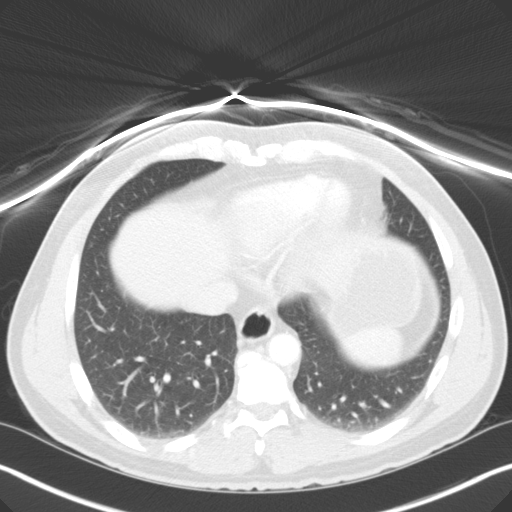

[12 of 46 positions shown; findings below may reference images not displayed]

FINDINGS: There has  been partial nephrectomy on the left for
resection of cancer.  There is a left renal cyst measuring 22 mm
which is unchanged from the prior study.  There is no obstruction
of the left kidney and there is no recurrent mass or adenopathy.

There is partial obstruction of the right kidney due to distal
right ureteral calculus.  No other renal calculi are noted.

The liver and gallbladder are normal.  The pancreas and spleen are
normal.  There is no bowel obstruction or adenopathy.
IMPRESSION: Partial obstruction of the right kidney due to a distal right
ureteral calculus.

Status post left partial nephrectomy for renal cell cancer.  No
recurrent tumor.

CT PELVIS
FINDINGS: 3 mm calculus is present in the distal right ureter
causing obstruction of the right ureter which is dilated.

The prostate is moderately enlarged measuring 4.6 x 5.1 cm.
Bladder wall is mildly thickened.  There is no bowel obstruction.
No mass or adenopathy.
IMPRESSION: 3 mm calculus in the distal right ureter causing obstruction of the
right kidney.

Enlarged prostate.

## 2011-04-11 ENCOUNTER — Ambulatory Visit (HOSPITAL_COMMUNITY): Payer: Federal, State, Local not specified - PPO | Attending: Cardiology | Admitting: Radiology

## 2011-04-11 VITALS — BP 166/92 | Ht 65.0 in | Wt 177.0 lb

## 2011-04-11 DIAGNOSIS — E785 Hyperlipidemia, unspecified: Secondary | ICD-10-CM | POA: Insufficient documentation

## 2011-04-11 DIAGNOSIS — R5381 Other malaise: Secondary | ICD-10-CM | POA: Insufficient documentation

## 2011-04-11 DIAGNOSIS — R5383 Other fatigue: Secondary | ICD-10-CM | POA: Insufficient documentation

## 2011-04-11 DIAGNOSIS — Z87891 Personal history of nicotine dependence: Secondary | ICD-10-CM | POA: Insufficient documentation

## 2011-04-11 DIAGNOSIS — R0789 Other chest pain: Secondary | ICD-10-CM | POA: Insufficient documentation

## 2011-04-11 DIAGNOSIS — R079 Chest pain, unspecified: Secondary | ICD-10-CM

## 2011-04-11 DIAGNOSIS — I4510 Unspecified right bundle-branch block: Secondary | ICD-10-CM

## 2011-04-11 DIAGNOSIS — I10 Essential (primary) hypertension: Secondary | ICD-10-CM | POA: Insufficient documentation

## 2011-04-11 MED ORDER — TECHNETIUM TC 99M TETROFOSMIN IV KIT
11.0000 | PACK | Freq: Once | INTRAVENOUS | Status: AC | PRN
  Administered 2011-04-11: 11 via INTRAVENOUS

## 2011-04-11 MED ORDER — TECHNETIUM TC 99M TETROFOSMIN IV KIT
33.0000 | PACK | Freq: Once | INTRAVENOUS | Status: AC | PRN
  Administered 2011-04-11: 33 via INTRAVENOUS

## 2011-04-11 NOTE — Progress Notes (Signed)
[facility] SITE 3 NUCLEAR MED [address] [place] [address] [phone]  Cardiology Nuclear Med Study  Gregory Carr is a 60 y.o. male [id number] 1950/11/19   Nuclear Med Background Indication for Stress Test:  Evaluation for Ischemia History:8/10/yrs ago @ [facility]  Myocardial Perfusion Study Cardiac Risk Factors: Family History - CAD, History of Smoking, Hypertension and Lipids  Symptoms:  Chest Pain and Fatigue   Nuclear Pre-Procedure Caffeine/Decaff Intake:  None NPO After: 7:30am   Lungs:  clear IV 0.9% NS with Angio Cath:  20g  IV Site: L Antecubital  IV Started by:  Stanton Kidney, EMT-P  Chest Size (in):  38 Cup Size: n/a  Height: 5\' 5"  (1.651 m)  Weight:  177 lb (80.287 kg)  BMI:  Body mass index is 29.45 kg/(m^2). Tech Comments:  Bystolic discontinued > 6 months ago, per patient.    Nuclear Med Study 1 or 2 day study: 1 day  Stress Test Type:  Stress  Reading MD: Marca Ancona, MD  Order Authorizing Provider:  T.Hodgin  Resting Radionuclide: Technetium 70m Tetrofosmin  Resting Radionuclide Dose: 11.0 mCi   Stress Radionuclide:  Technetium 22m Tetrofosmin  Stress Radionuclide Dose: 33.0 mCi           Stress Protocol Rest HR: 73 Stress HR: 154/104  Rest BP: 166/92 Stress BP: 104  Exercise Time (min): 10:15 METS: 12.10   Predicted Max HR: 162 bpm % Max HR: 88.89 bpm Rate Pressure Product: 41324   Dose of Adenosine (mg):  n/a Dose of Lexiscan: n/a mg  Dose of Atropine (mg): n/a Dose of Dobutamine: n/a mcg/kg/min (at max HR)  Stress Test Technologist: Milana Na, EMT-P  Nuclear Technologist:  Domenic Polite, CNMT     Rest Procedure:  Myocardial perfusion imaging was performed at rest 45 minutes following the intravenous administration of Technetium 11m Tetrofosmin. Rest ECG: NSR  Stress Procedure:  The patient exercised for 10:15.  The patient stopped due to fatigue and denied any chest pain.  There were non specific ST-T wave  changes and rare pvcs.  Technetium 44m Tetrofosmin was injected at peak exercise and myocardial perfusion imaging was performed after a brief delay. Stress ECG: No significant change from baseline ECG  QPS Raw Data Images:  Normal; no motion artifact; normal heart/lung ratio. Stress Images:  Normal homogeneous uptake in all areas of the myocardium. Rest Images:  Normal homogeneous uptake in all areas of the myocardium. Subtraction (SDS):  There is no evidence of scar or ischemia. Transient Ischemic Dilatation (Normal <1.22):  1.05 Lung/Heart Ratio (Normal <0.45):  0.38  Quantitative Gated Spect Images QGS EDV:  88 ml QGS ESV:  33 ml QGS cine images:  NL LV Function; NL Wall Motion QGS EF: 63%  Impression Exercise Capacity:  Good exercise capacity. BP Response:  Hypertensive blood pressure response. Clinical Symptoms:  No chest pain. + Fatigue.  ECG Impression:  RBBB, no change with infusion.  Comparison with Prior Nuclear Study: No images to compare  Overall Impression:  Normal stress nuclear study.  Hiliana Eilts [employer]

## 2011-04-12 ENCOUNTER — Telehealth: Payer: Self-pay | Admitting: Family

## 2011-04-12 ENCOUNTER — Other Ambulatory Visit: Payer: Self-pay | Admitting: Internal Medicine

## 2011-04-12 NOTE — Telephone Encounter (Signed)
Pls contact pt and let him know that his stress test was normal.

## 2011-04-12 NOTE — Telephone Encounter (Signed)
Call placed to patient at [phone], line busy x 3.

## 2011-04-12 NOTE — Telephone Encounter (Signed)
Ok for 6 months 

## 2011-04-13 NOTE — Telephone Encounter (Signed)
Call placed to patient , male stated she will have patient return call. She has acknowledged that patient did receive letter in the mail.

## 2011-04-13 NOTE — Telephone Encounter (Signed)
Rx refill sent to pharmacy. 

## 2011-04-17 NOTE — Telephone Encounter (Signed)
No return call from patient.

## 2011-04-20 ENCOUNTER — Telehealth: Payer: Self-pay | Admitting: Internal Medicine

## 2011-04-20 NOTE — Telephone Encounter (Signed)
[facility] [phone], spoke with Morrie Sheldon; she was informed Rx refill denied to have patient contact office regarding refill request.

## 2011-04-20 NOTE — Telephone Encounter (Signed)
Refill- magic mouthwash . 1tsp; swish and spit QID as needed.

## 2011-04-21 ENCOUNTER — Other Ambulatory Visit: Payer: Self-pay | Admitting: *Deleted

## 2011-04-21 MED ORDER — MAGIC MOUTHWASH: ORAL | Status: DC

## 2011-04-21 NOTE — Telephone Encounter (Signed)
Ok to fill as previous

## 2011-04-21 NOTE — Telephone Encounter (Signed)
Refill sent to pharmacy, x no refills. Left message with pt's wife re: Rx completion.

## 2011-04-21 NOTE — Telephone Encounter (Signed)
Received message from pt's wife to return her call re: denial of magic mouthwash request. Spoke with pt and advised him that medication was not listed on his current medication list. Pt states he received Rx originally from Dr Artist Pais and only uses it as needed for sores that he gets in his mouth due to his medications causing his mouth to be dry. [facility] [place] Please advise.

## 2011-04-28 ENCOUNTER — Telehealth: Payer: Self-pay | Admitting: *Deleted

## 2011-04-28 NOTE — Telephone Encounter (Signed)
Patients wife called and left voice message stating a refill for magic mouthwash was requested prior to them leaving for vacation, and they did not have the time to pick the Rx up from the pharmacy. If approved she would like to know if the Rx could be sent to [facility], in [place], [place] [phone] / fax [phone]. Please advise.

## 2011-04-29 ENCOUNTER — Other Ambulatory Visit: Payer: Self-pay | Admitting: Internal Medicine

## 2011-04-29 MED ORDER — MAGIC MOUTHWASH: ORAL | Status: DC

## 2011-04-29 NOTE — Telephone Encounter (Signed)
escripted to requested pharmacy

## 2011-05-01 NOTE — Telephone Encounter (Signed)
Pharmacy called for clarification on Magic Mouth Wash, He stated they have lidocaine, nystatin, tetracycline and benadryl, and would like to know if it would be okay to dispense.   Call placed to Pharmacy at [phone],Pharmacist Dantonio was informed ok to dispense what they have in stock per Dr Regino Schultze.

## 2011-06-05 ENCOUNTER — Telehealth: Payer: Self-pay | Admitting: *Deleted

## 2011-06-05 DIAGNOSIS — Z125 Encounter for screening for malignant neoplasm of prostate: Secondary | ICD-10-CM

## 2011-06-05 DIAGNOSIS — E785 Hyperlipidemia, unspecified: Secondary | ICD-10-CM

## 2011-06-05 NOTE — Telephone Encounter (Signed)
Lab order faxed to [facility] at [phone].  Call placed to patient at [phone], no answer. A voice message was left informing patient lab order faxed to facility requested.

## 2011-06-05 NOTE — Telephone Encounter (Signed)
Patients wife called and left a detailed voice message stating patient is out of town in [place] and has made arrangements to have his blood work done at the [facility] on Wednesday 06/07/2011. She would like to know if an order could be faxed to [phone]. Phone number is [phone]. She stated that he is scheduled to have his cholesterol and Liver checked, and would like to add a psa. The appointment time is at 8:45 am.

## 2011-06-05 NOTE — Telephone Encounter (Signed)
Ok lipid/lft 272.4 and psa -prostate cancer screening

## 2011-07-02 ENCOUNTER — Other Ambulatory Visit: Payer: Self-pay | Admitting: Internal Medicine

## 2011-07-18 ENCOUNTER — Other Ambulatory Visit: Payer: Self-pay | Admitting: Internal Medicine

## 2011-07-18 NOTE — Telephone Encounter (Signed)
Rx refill sent to pharmacy. 

## 2011-07-24 ENCOUNTER — Telehealth: Payer: Self-pay | Admitting: *Deleted

## 2011-07-24 NOTE — Telephone Encounter (Signed)
tcho and TG quite high still. No improvement. Is he taking the lipitor daily?

## 2011-07-24 NOTE — Telephone Encounter (Signed)
Patients wife called and left voice message stating patient had blood work done, and would like a copy of the results mailed to him at [facility], [place], [place] [address].

## 2011-07-25 NOTE — Telephone Encounter (Signed)
Call placed to patient at [phone], he states that he has been taking his Lipitor daily and has not missed any doses.

## 2011-07-25 NOTE — Telephone Encounter (Signed)
Med section says pravachol. Lab note says lipitor 40. Increase lipitor 80mg  po qd. Needs lipid/lft in 3-4 wks 272.4

## 2011-07-25 NOTE — Telephone Encounter (Signed)
Call placed to patient at [phone], line busy x 3.

## 2011-07-26 NOTE — Telephone Encounter (Signed)
Call placed to patient at [phone], no answer. A voice message was left for patient to return phone call regarding his lab results and the name of his cholesterol medication he is currently taking.

## 2011-07-27 MED ORDER — ATORVASTATIN CALCIUM 80 MG PO TABS: 80.0000 mg | ORAL_TABLET | Freq: Every day | ORAL | Status: DC

## 2011-07-27 NOTE — Telephone Encounter (Signed)
Call placed to patient [phone], he was informed per Dr Rodena Medin instructions. He was informed of medication change from Pravastatin to Lipitor. He has requested a 45 day supply to [facility] He stated that he will be home at the end month. He was advised to have blood work done before his May 13th appointment. Patient verbalized understanding and agrees.

## 2011-08-02 ENCOUNTER — Other Ambulatory Visit: Payer: Self-pay | Admitting: Internal Medicine

## 2011-09-04 ENCOUNTER — Ambulatory Visit: Payer: Federal, State, Local not specified - PPO | Admitting: Internal Medicine

## 2011-09-12 ENCOUNTER — Other Ambulatory Visit: Payer: Self-pay | Admitting: *Deleted

## 2011-09-12 DIAGNOSIS — E785 Hyperlipidemia, unspecified: Secondary | ICD-10-CM

## 2011-09-12 DIAGNOSIS — R739 Hyperglycemia, unspecified: Secondary | ICD-10-CM

## 2011-09-12 LAB — HEPATIC FUNCTION PANEL
ALT: 37 U/L (ref 0–53)
AST: 26 U/L (ref 0–37)
Albumin: 4.2 g/dL (ref 3.5–5.2)
Alkaline Phosphatase: 79 U/L (ref 39–117)
Total Protein: 6.3 g/dL (ref 6.0–8.3)

## 2011-09-12 LAB — HEMOGLOBIN A1C
Hgb A1c MFr Bld: 5.6 % (ref <5.7)
Mean Plasma Glucose: 114 mg/dL (ref <117)

## 2011-09-12 LAB — LIPID PANEL
Cholesterol: 199 mg/dL (ref 0–200)
HDL: 39 mg/dL — ABNORMAL LOW (ref >39)
Total CHOL/HDL Ratio: 5.1 Ratio
Triglycerides: 520 mg/dL — ABNORMAL HIGH (ref <150)

## 2011-09-12 LAB — BASIC METABOLIC PANEL
Chloride: 105 mEq/L (ref 96–112)
Potassium: 5.1 mEq/L (ref 3.5–5.3)
Sodium: 142 mEq/L (ref 135–145)

## 2011-09-15 ENCOUNTER — Encounter: Payer: Self-pay | Admitting: Internal Medicine

## 2011-09-15 ENCOUNTER — Ambulatory Visit (INDEPENDENT_AMBULATORY_CARE_PROVIDER_SITE_OTHER): Payer: Federal, State, Local not specified - PPO | Admitting: Internal Medicine

## 2011-09-15 VITALS — BP 100/80 | HR 91 | Temp 97.8°F | Resp 18 | Wt 173.0 lb

## 2011-09-15 DIAGNOSIS — IMO0001 Reserved for inherently not codable concepts without codable children: Secondary | ICD-10-CM

## 2011-09-15 DIAGNOSIS — E785 Hyperlipidemia, unspecified: Secondary | ICD-10-CM

## 2011-09-15 DIAGNOSIS — M791 Myalgia, unspecified site: Secondary | ICD-10-CM

## 2011-09-21 ENCOUNTER — Other Ambulatory Visit: Payer: Self-pay | Admitting: Internal Medicine

## 2011-09-21 DIAGNOSIS — IMO0001 Reserved for inherently not codable concepts without codable children: Secondary | ICD-10-CM

## 2011-09-23 NOTE — Progress Notes (Signed)
  Subjective:    Patient ID: Gregory Carr, male    DOB: [birth year], 61 y.o.   MRN: [id number]  HPI Pt presents to clinic for followup of multiple medical problems. S/p change of statin to lipitor. Notes chronic muscle fatigue. TG remain elevated.   No past medical history on file. No past surgical history on file.  reports that he has quit smoking. His smoking use included Cigarettes. He has never used smokeless tobacco. His alcohol and drug histories not on file. family history is not on file. No Known Allergies    Review of Systems see hpi     Objective:   Physical Exam  Physical Exam  Nursing note and vitals reviewed. Constitutional: Appears well-developed and well-nourished. No distress.  HENT:  Head: Normocephalic and atraumatic.  Right Ear: External ear normal.  Left Ear: External ear normal.  Eyes: Conjunctivae are normal. No scleral icterus.  Neck: Neck supple. Carotid bruit is not present.  Cardiovascular: Normal rate, regular rhythm and normal heart sounds.  Exam reveals no gallop and no friction rub.   No murmur heard. Pulmonary/Chest: Effort normal and breath sounds normal. No respiratory distress. He has no wheezes. no rales.  Lymphadenopathy:    He has no cervical adenopathy.  Neurological:Alert.  Skin: Skin is warm and dry. Not diaphoretic.  Psychiatric: Has a normal mood and affect.        Assessment & Plan:

## 2011-09-23 NOTE — Assessment & Plan Note (Signed)
Obtain ck. Hold statin for 1 week to determine if sx's related. If unrelated resume lipitor and consider addition of trilipix.

## 2011-11-17 ENCOUNTER — Other Ambulatory Visit: Payer: Self-pay | Admitting: Internal Medicine

## 2011-11-17 NOTE — Telephone Encounter (Signed)
Please advise re: Nystatin mouthwash refill.

## 2011-11-17 NOTE — Telephone Encounter (Signed)
Ok #379ml no rf

## 2011-11-17 NOTE — Telephone Encounter (Signed)
[facility]

## 2011-11-27 ENCOUNTER — Other Ambulatory Visit: Payer: Self-pay | Admitting: Internal Medicine

## 2011-12-10 ENCOUNTER — Other Ambulatory Visit: Payer: Self-pay | Admitting: Internal Medicine

## 2011-12-15 ENCOUNTER — Ambulatory Visit (HOSPITAL_BASED_OUTPATIENT_CLINIC_OR_DEPARTMENT_OTHER)
Admission: RE | Admit: 2011-12-15 | Discharge: 2011-12-15 | Disposition: A | Discharge status: Home / Self Care | Payer: Federal, State, Local not specified - PPO | Source: Ambulatory Visit | Attending: Internal Medicine | Admitting: Internal Medicine

## 2011-12-15 ENCOUNTER — Ambulatory Visit (INDEPENDENT_AMBULATORY_CARE_PROVIDER_SITE_OTHER): Payer: Federal, State, Local not specified - PPO | Admitting: Internal Medicine

## 2011-12-15 ENCOUNTER — Telehealth: Payer: Self-pay | Admitting: Internal Medicine

## 2011-12-15 ENCOUNTER — Encounter: Payer: Self-pay | Admitting: Internal Medicine

## 2011-12-15 VITALS — BP 118/72 | HR 76 | Temp 97.9°F | Resp 16 | Wt 173.0 lb

## 2011-12-15 DIAGNOSIS — E785 Hyperlipidemia, unspecified: Secondary | ICD-10-CM

## 2011-12-15 DIAGNOSIS — M791 Myalgia, unspecified site: Secondary | ICD-10-CM

## 2011-12-15 DIAGNOSIS — Z79899 Other long term (current) drug therapy: Secondary | ICD-10-CM

## 2011-12-15 DIAGNOSIS — C649 Malignant neoplasm of unspecified kidney, except renal pelvis: Secondary | ICD-10-CM

## 2011-12-15 DIAGNOSIS — K148 Other diseases of tongue: Secondary | ICD-10-CM

## 2011-12-15 DIAGNOSIS — IMO0001 Reserved for inherently not codable concepts without codable children: Secondary | ICD-10-CM

## 2011-12-15 DIAGNOSIS — I10 Essential (primary) hypertension: Secondary | ICD-10-CM

## 2011-12-15 HISTORY — DX: Malignant neoplasm of unspecified kidney, except renal pelvis: C64.9

## 2011-12-15 MED ORDER — ATORVASTATIN CALCIUM 80 MG PO TABS: 80.0000 mg | ORAL_TABLET | Freq: Every day | ORAL | Status: DC

## 2011-12-15 NOTE — Progress Notes (Signed)
  Subjective:    Patient ID: Gregory Carr, male    DOB: 1950-05-04, 61 y.o.   MRN: [id number]  HPI Pt presents to clinic for followup of multiple medical problems. States ran out of statin x1.5 weeks. Was taking pravastatin prior to that. In retrospect doesn't believe statin contributed to myalgia. Reviewed mildly elevated ck. H/o RCC s/p partial left nephrectomy. States was told would need regular cxr's but no further renal imaging. Chart review shows abd/pelvic ct 2010 without mass. Notes one year h/o non healing superficial white lesion located left lateral tongue border.   No past medical history on file. No past surgical history on file.  reports that he has quit smoking. His smoking use included Cigarettes. He has never used smokeless tobacco. His alcohol and drug histories not on file. family history is not on file. No Known Allergies   Review of Systems see hpi     Objective:   Physical Exam  Physical Exam  Nursing note and vitals reviewed. Constitutional: Appears well-developed and well-nourished. No distress.  HENT: left lateral tongue. ~1cm superficial lesion with lacy white appearance.  Head: Normocephalic and atraumatic.  Right Ear: External ear normal.  Left Ear: External ear normal.  Eyes: Conjunctivae are normal. No scleral icterus.  Neck: Neck supple. Carotid bruit is not present.  Cardiovascular: Normal rate, regular rhythm and normal heart sounds.  Exam reveals no gallop and no friction rub.   No murmur heard. Pulmonary/Chest: Effort normal and breath sounds normal. No respiratory distress. He has no wheezes. no rales.  Lymphadenopathy:    He has no cervical adenopathy.  Neurological:Alert.  Skin: Skin is warm and dry. Not diaphoretic.  Psychiatric: Has a normal mood and affect.        Assessment & Plan:

## 2011-12-15 NOTE — Assessment & Plan Note (Signed)
Obtain cxr as requested. Last renal imaging 2010. At next visit review and discuss any further renal imaging

## 2011-12-15 NOTE — Patient Instructions (Signed)
Please schedule fasting labs in one month Lipid, direct ldl, lft-272.4, tsh-401.9 and ck enzyme-v58.69

## 2011-12-15 NOTE — Telephone Encounter (Signed)
Please schedule fasting labs in one month  Lipid, direct ldl, lft-272.4, tsh-401.9 and ck enzyme-v58.69   Patient states that he will be going to [facility] lab

## 2011-12-15 NOTE — Assessment & Plan Note (Signed)
Repeat ck today. Attempt lipitor. Obtain lipid/lft/direct ldl in 1 month.

## 2011-12-15 NOTE — Assessment & Plan Note (Signed)
ENT consult

## 2011-12-15 NOTE — Telephone Encounter (Signed)
Labs entered... 12/15/11@4 :12pm/LMB

## 2012-02-21 ENCOUNTER — Telehealth: Payer: Self-pay | Admitting: Internal Medicine

## 2012-02-21 MED ORDER — ATORVASTATIN CALCIUM 80 MG PO TABS: 80.0000 mg | ORAL_TABLET | Freq: Every day | ORAL | Status: DC

## 2012-02-21 NOTE — Telephone Encounter (Signed)
LMOM with contact name & number to clarify what patient is needing refills on: received phone message from spouse requesting Majic Mouthwash & Atorvastatin; pharmacy request is for Nystatin Susp/SLS

## 2012-02-21 NOTE — Telephone Encounter (Signed)
Faxed detailed written Rx: MMW, Benadryl, Mylanta & Carafate; LMOM for pt, if pharmacy cannot do this to purchase first two ingredients OTC; mix equal parts & use same dosage instructions/SLS

## 2012-02-21 NOTE — Telephone Encounter (Signed)
Caller: Gregory Carr/Patient; Patient Name: Gregory Carr; PCP: Marguarite Arbour (Adults only); Best Callback Phone Number: [facility] [phone]. Amy calling for refill of this gentleman's Nystatin Suspension. RN attempted to reach him for triage at # provided by pharmacy staff, no answer and no voice mail for message.

## 2012-02-21 NOTE — Telephone Encounter (Signed)
Spoke with pt's wife & clarified that Atorvastatin & Magic Mouthwash are the correct requested Rx[s]; awaiting mail order on Atorvastatin & had dental work done on [employer], informed [facility]'s [facility] that they donot have the capability for the MMW, but will send lipitor;Done/SLS

## 2012-03-15 ENCOUNTER — Ambulatory Visit: Payer: Federal, State, Local not specified - PPO | Admitting: Internal Medicine

## 2012-04-01 NOTE — Addendum Note (Signed)
Addended by: Mervin Kung A on: 04/01/2012 01:37 PM   Modules accepted: Orders

## 2012-04-01 NOTE — Telephone Encounter (Signed)
Pt presented to the lab, future orders released. 

## 2012-04-02 LAB — HEPATIC FUNCTION PANEL
ALT: 39 U/L (ref 0–53)
Albumin: 4.8 g/dL (ref 3.5–5.2)
Bilirubin, Direct: 0.1 mg/dL (ref 0.0–0.3)
Total Protein: 6.9 g/dL (ref 6.0–8.3)

## 2012-04-02 LAB — LIPID PANEL
HDL: 43 mg/dL (ref >39)
Triglycerides: 617 mg/dL — ABNORMAL HIGH (ref <150)

## 2012-04-02 LAB — CK TOTAL AND CKMB (NOT AT ARMC): Total CK: 78 U/L (ref 7–232)

## 2012-04-02 LAB — LDL CHOLESTEROL, DIRECT: Direct LDL: 95 mg/dL

## 2012-04-02 LAB — TSH: TSH: 3.394 u[IU]/mL (ref 0.350–4.500)

## 2012-04-05 ENCOUNTER — Ambulatory Visit (INDEPENDENT_AMBULATORY_CARE_PROVIDER_SITE_OTHER): Payer: Federal, State, Local not specified - PPO | Admitting: Internal Medicine

## 2012-04-05 ENCOUNTER — Encounter: Payer: Self-pay | Admitting: Internal Medicine

## 2012-04-05 ENCOUNTER — Telehealth: Payer: Self-pay | Admitting: Internal Medicine

## 2012-04-05 VITALS — BP 124/84 | HR 86 | Temp 98.0°F | Resp 18 | Wt 183.8 lb

## 2012-04-05 DIAGNOSIS — E785 Hyperlipidemia, unspecified: Secondary | ICD-10-CM

## 2012-04-05 MED ORDER — OMEGA-3-ACID ETHYL ESTERS 1 G PO CAPS: 2.0000 g | ORAL_CAPSULE | Freq: Two times a day (BID) | ORAL | Status: DC

## 2012-04-05 NOTE — Patient Instructions (Signed)
Please schedule fasting labs prior to next visit Lipid/lft-272.4 and chem7-v58.69

## 2012-04-05 NOTE — Telephone Encounter (Signed)
LAB ORDER WEEK OF 3-3-2014Lipid/lft-272.4 and chem7-v58.69

## 2012-04-06 NOTE — Assessment & Plan Note (Signed)
suboptimal control. Continue statin. Add lovaza. Low fat diet, exercise and weight loss encouraged.

## 2012-04-06 NOTE — Progress Notes (Signed)
  Subjective:    Patient ID: Gregory Carr, male    DOB: [birth year], 61 y.o.   MRN: [id number]  HPI Pt presents to clinic for followup of multiple medical problems. Reviewed persistently elevated TG. Takes low dose otc fish oil with lipitor. Total time of visit approximately 26 minutes of which >50% of time spent in counseling.  No past medical history on file. No past surgical history on file.  reports that he has quit smoking. His smoking use included Cigarettes. He has never used smokeless tobacco. His alcohol and drug histories not on file. family history is not on file. No Known Allergies    Review of Systems see hpi     Objective:   Physical Exam  Nursing note and vitals reviewed. Constitutional: He appears well-developed and well-nourished. No distress.  Neurological: He is alert.  Skin: He is not diaphoretic.  Psychiatric: He has a normal mood and affect.          Assessment & Plan:

## 2012-04-12 ENCOUNTER — Other Ambulatory Visit: Payer: Self-pay | Admitting: *Deleted

## 2012-04-12 MED ORDER — OMEGA-3-ACID ETHYL ESTERS 1 G PO CAPS: 2.0000 g | ORAL_CAPSULE | Freq: Two times a day (BID) | ORAL | Status: DC

## 2012-04-12 NOTE — Progress Notes (Signed)
Per pt request, cannot afford Rx at local pharmacy; request to mail order 0-day supply; Rx done/SLS

## 2012-06-21 ENCOUNTER — Other Ambulatory Visit: Payer: Self-pay | Admitting: Internal Medicine

## 2012-06-24 ENCOUNTER — Telehealth: Payer: Self-pay | Admitting: Internal Medicine

## 2012-06-24 NOTE — Telephone Encounter (Signed)
Refill- nexium 40mg  cap. Take one capsule daily

## 2012-06-25 MED ORDER — ESOMEPRAZOLE MAGNESIUM 40 MG PO CPDR: 40.0000 mg | DELAYED_RELEASE_CAPSULE | Freq: Every day | ORAL | Status: DC

## 2012-07-02 ENCOUNTER — Ambulatory Visit: Payer: Federal, State, Local not specified - PPO | Admitting: Family Medicine

## 2012-07-02 DIAGNOSIS — Z0289 Encounter for other administrative examinations: Secondary | ICD-10-CM

## 2012-07-05 ENCOUNTER — Ambulatory Visit: Payer: Federal, State, Local not specified - PPO | Admitting: Internal Medicine

## 2012-07-09 ENCOUNTER — Telehealth: Payer: Self-pay | Admitting: Internal Medicine

## 2012-07-09 MED ORDER — OMEGA-3-ACID ETHYL ESTERS 1 G PO CAPS: 2.0000 g | ORAL_CAPSULE | Freq: Two times a day (BID) | ORAL | Status: DC

## 2012-07-09 NOTE — Telephone Encounter (Signed)
Patients wife called in requesting a 90 day supply of lovaza to be called in to [facility]

## 2012-09-09 ENCOUNTER — Encounter: Payer: Self-pay | Admitting: Family Medicine

## 2012-09-09 ENCOUNTER — Ambulatory Visit (INDEPENDENT_AMBULATORY_CARE_PROVIDER_SITE_OTHER): Payer: Federal, State, Local not specified - PPO | Admitting: Family Medicine

## 2012-09-09 VITALS — BP 107/72 | HR 82 | Temp 98.2°F | Ht 65.5 in | Wt 177.1 lb

## 2012-09-09 DIAGNOSIS — R739 Hyperglycemia, unspecified: Secondary | ICD-10-CM

## 2012-09-09 DIAGNOSIS — R7309 Other abnormal glucose: Secondary | ICD-10-CM

## 2012-09-09 DIAGNOSIS — Z0000 Encounter for general adult medical examination without abnormal findings: Secondary | ICD-10-CM

## 2012-09-09 DIAGNOSIS — C642 Malignant neoplasm of left kidney, except renal pelvis: Secondary | ICD-10-CM

## 2012-09-09 DIAGNOSIS — E785 Hyperlipidemia, unspecified: Secondary | ICD-10-CM

## 2012-09-09 DIAGNOSIS — I10 Essential (primary) hypertension: Secondary | ICD-10-CM

## 2012-09-09 DIAGNOSIS — E739 Lactose intolerance, unspecified: Secondary | ICD-10-CM

## 2012-09-09 DIAGNOSIS — B351 Tinea unguium: Secondary | ICD-10-CM

## 2012-09-09 DIAGNOSIS — N400 Enlarged prostate without lower urinary tract symptoms: Secondary | ICD-10-CM

## 2012-09-09 DIAGNOSIS — C649 Malignant neoplasm of unspecified kidney, except renal pelvis: Secondary | ICD-10-CM

## 2012-09-09 LAB — CBC
HCT: 41.3 % (ref 39.0–52.0)
Hemoglobin: 14.4 g/dL (ref 13.0–17.0)
MCH: 29.9 pg (ref 26.0–34.0)
MCHC: 34.9 g/dL (ref 30.0–36.0)

## 2012-09-09 MED ORDER — OMEGA-3-ACID ETHYL ESTERS 1 G PO CAPS: 2.0000 g | ORAL_CAPSULE | Freq: Two times a day (BID) | ORAL | Status: DC

## 2012-09-09 NOTE — Patient Instructions (Addendum)
Next visit annual with labs prior lipid, renal, cbc,tsh, hgba1c, hepatic  DASH Diet The DASH diet stands for "Dietary Approaches to Stop Hypertension." It is a healthy eating plan that has been shown to reduce high blood pressure (hypertension) in as little as 14 days, while also possibly providing other significant health benefits. These other health benefits include reducing the risk of breast cancer after menopause and reducing the risk of type 2 diabetes, heart disease, colon cancer, and stroke. Health benefits also include weight loss and slowing kidney failure in patients with chronic kidney disease.  DIET GUIDELINES  Limit salt (sodium). Your diet should contain less than 1500 mg of sodium daily.  Limit refined or processed carbohydrates. Your diet should include mostly whole grains. Desserts and added sugars should be used sparingly.  Include small amounts of heart-healthy fats. These types of fats include nuts, oils, and tub margarine. Limit saturated and trans fats. These fats have been shown to be harmful in the body. CHOOSING FOODS  The following food groups are based on a 2000 calorie diet. See your Registered Dietitian for individual calorie needs. Grains and Grain Products (6 to 8 servings daily)  Eat More Often: Whole-wheat bread, brown rice, whole-grain or wheat pasta, quinoa, popcorn without added fat or salt (air popped).  Eat Less Often: White bread, white pasta, white rice, cornbread. Vegetables (4 to 5 servings daily)  Eat More Often: Fresh, frozen, and canned vegetables. Vegetables may be raw, steamed, roasted, or grilled with a minimal amount of fat.  Eat Less Often/Avoid: Creamed or fried vegetables. Vegetables in a cheese sauce. Fruit (4 to 5 servings daily)  Eat More Often: All fresh, canned (in natural juice), or frozen fruits. Dried fruits without added sugar. One hundred percent fruit juice ( cup [237 mL] daily).  Eat Less Often: Dried fruits with added  sugar. Canned fruit in light or heavy syrup. [employer], Fish, and Poultry (2 servings or less daily. One serving is 3 to 4 oz [85-114 g]).  Eat More Often: Ninety percent or leaner ground beef, tenderloin, sirloin. Round cuts of beef, chicken breast, [place] breast. All fish. Grill, bake, or broil your meat. Nothing should be fried.  Eat Less Often/Avoid: Fatty cuts of meat, [place], or chicken leg, thigh, or wing. Fried cuts of meat or fish. Dairy (2 to 3 servings)  Eat More Often: Low-fat or fat-free milk, low-fat plain or light yogurt, reduced-fat or part-skim cheese.  Eat Less Often/Avoid: Milk (whole, 2%).Whole milk yogurt. Full-fat cheeses. Nuts, Seeds, and Legumes (4 to 5 servings per week)  Eat More Often: All without added salt.  Eat Less Often/Avoid: Salted nuts and seeds, canned beans with added salt. Fats and Sweets (limited)  Eat More Often: Vegetable oils, tub margarines without trans fats, sugar-free gelatin. Mayonnaise and salad dressings.  Eat Less Often/Avoid: Coconut oils, palm oils, butter, stick margarine, cream, half and half, cookies, candy, pie. FOR MORE INFORMATION The Dash Diet Eating Plan: www.dashdiet.org Document Released: 03/30/2011 Document Revised: 07/03/2011 Document Reviewed: 03/30/2011 [facility] Patient Information 2013 [place], [place].

## 2012-09-09 NOTE — Progress Notes (Signed)
Patient ID: Gregory Carr, male   DOB: 1951/02/09, 62 y.o.   MRN: [id number] Gregory Carr [id number] [birth year] 09/09/2012      Progress Note-Follow Up  Subjective  Chief Complaint  Chief Complaint  Patient presents with  . Follow-up    3 month    HPI  Patient is a 62 Caucasian male in today for followup he has a past medical history significant for hypertension, hyperglycemia, hyperlipidemia as well as multiple concerns. He feels fairly well. He had renal cell carcinoma in years ago had a nephrectomy he is doing very well at this time. Follows with urology. Is complaining of a thickened uncomfortable toenail on his left great toe but no other acute complaints. No recent illness. No GI or GU complaints. No chest pain or palpitations, shortness of breath. Take medications as prescribed  History reviewed. No pertinent past medical history.  History reviewed. No pertinent past surgical history.  History reviewed. No pertinent family history.  History   Social History  . Marital Status: Married    Spouse Name: N/A    Number of Children: N/A  . Years of Education: N/A   Occupational History  . Not on file.   Social History Main Topics  . Smoking status: Former Smoker    Types: Cigarettes  . Smokeless tobacco: Never Used  . Alcohol Use: Not on file  . Drug Use: Not on file  . Sexually Active: Not on file   Other Topics Concern  . Not on file   Social History Narrative  . No narrative on file    Current Outpatient Prescriptions on File Prior to Visit  Medication Sig Dispense Refill  . Alum & Mag Hydroxide-Simeth (MAGIC MOUTHWASH) SOLN 1 Teaspoon Swish and spit four times daily as needed.  350 mL  0  . amphetamine-dextroamphetamine (ADDERALL) 20 MG tablet Take 20 mg by mouth 3 (three) times daily.        Marland Kitchen atorvastatin (LIPITOR) 80 MG tablet TAKE 1 TABLET (80 MG) DAILY  90 tablet  1  . b complex vitamins capsule Take 1 capsule by mouth daily.      . Coenzyme Q10  (COQ-10) 400 MG CAPS Take 400 mg by mouth daily.      . diphenhydrAMINE (SOMINEX) 25 MG tablet Take 25 mg by mouth 2 (two) times daily as needed.      Marland Kitchen esomeprazole (NEXIUM) 40 MG capsule Take 1 capsule (40 mg total) by mouth daily before breakfast.  90 capsule  1  . Eszopiclone (ESZOPICLONE) 3 MG TABS Take 3 mg by mouth at bedtime. Take immediately before bedtime       . Fiber CAPS Take by mouth daily.      Marland Kitchen HYDROmorphone HCl (EXALGO) 8 MG TB24 Take 8 mg by mouth 2 (two) times daily.        . Magnesium 300 MG CAPS Take 300 mg by mouth daily.      . Misc Natural Products (COLON CLEANSER PO) Take 500 mg by mouth daily.      . Multiple Vitamins-Minerals (CENTRUM SILVER PO) Take by mouth daily.      . Multiple Vitamins-Minerals (OCUVITE PO) Take by mouth daily.      Marland Kitchen NIASPAN 1000 MG CR tablet TAKE 1 TABLET DAILY  90 each  0  . NON FORMULARY Take 500 mg by mouth daily. Tumeric once a daily      . NON FORMULARY Take 750 mg by mouth daily. Guggle Extract once a  day      . NON FORMULARY Test X 180 once tablet by mouth once a day-hormone supplment      . nystatin (MYCOSTATIN) 100000 UNIT/ML suspension SWISH AND SPIT 1 TEASPOONFUL 4 TIMES A DAY AS NEEDED  350 mL  0  . omega-3 acid ethyl esters (LOVAZA) 1 G capsule Take 2 capsules (2 g total) by mouth 2 (two) times daily.  360 capsule  0  . trazodone (DESYREL) 300 MG tablet Take 200 mg by mouth every evening.       . venlafaxine (EFFEXOR) 75 MG tablet Take 75 mg by mouth 2 (two) times daily.         No current facility-administered medications on file prior to visit.    No Known Allergies  Review of Systems  Review of Systems  Constitutional: Negative for fever and malaise/fatigue.  HENT: Negative for congestion.   Eyes: Negative for discharge.  Respiratory: Negative for shortness of breath.   Cardiovascular: Negative for chest pain, palpitations and leg swelling.  Gastrointestinal: Negative for nausea, abdominal pain and diarrhea.   Genitourinary: Negative for dysuria.  Musculoskeletal: Negative for falls.  Skin: Negative for rash.  Neurological: Negative for loss of consciousness and headaches.  Endo/Heme/Allergies: Negative for polydipsia.  Psychiatric/Behavioral: Negative for depression and suicidal ideas. The patient is not nervous/anxious and does not have insomnia.     Objective  BP 107/72  Pulse 82  Temp(Src) 98.2 F (36.8 C) (Oral)  Ht 5' 5.5" (1.664 m)  Wt 177 lb 1.9 oz (80.341 kg)  BMI 29.02 kg/m2  SpO2 95%  Physical Exam  Physical Exam  Constitutional: He is oriented to person, place, and time and well-developed, well-nourished, and in no distress. No distress.  HENT:  Head: Normocephalic and atraumatic.  Eyes: Conjunctivae are normal.  Neck: Neck supple. No thyromegaly present.  Cardiovascular: Normal rate, regular rhythm and normal heart sounds.   No murmur heard. Pulmonary/Chest: Effort normal and breath sounds normal. No respiratory distress.  Abdominal: He exhibits no distension and no mass. There is no tenderness.  Musculoskeletal: He exhibits no edema.  Neurological: He is alert and oriented to person, place, and time.  Skin: Skin is warm.  Psychiatric: Memory, affect and judgment normal.    Lab Results  Component Value Date   TSH 3.394 04/01/2012   Lab Results  Component Value Date   WBC 5.9 03/24/2011   HGB 16.6 03/24/2011   HCT 48.5 03/24/2011   MCV 93.6 03/24/2011   PLT 237 03/24/2011   Lab Results  Component Value Date   CREATININE 0.88 09/12/2011   BUN 17 09/12/2011   NA 142 09/12/2011   K 5.1 09/12/2011   CL 105 09/12/2011   CO2 31 09/12/2011   Lab Results  Component Value Date   ALT 39 04/01/2012   AST 26 04/01/2012   ALKPHOS 96 04/01/2012   BILITOT 0.9 04/01/2012   Lab Results  Component Value Date   CHOL 259* 04/01/2012   Lab Results  Component Value Date   HDL 43 04/01/2012   Lab Results  Component Value Date   LDLCALC Comment:   Not calculated due to  Triglyceride >400. Suggest ordering Direct LDL (Unit Code: 29528).   Total Cholesterol/HDL Ratio:CHD Risk                        Coronary Heart Disease Risk Table  Men       Women          1/2 Average Risk              3.4        3.3              Average Risk              5.0        4.4           2X Average Risk              9.6        7.1           3X Average Risk             23.4       11.0 Use the calculated Patient Ratio above and the CHD Risk table  to determine the patient's CHD Risk. ATP III Classification (LDL):       < 100        mg/dL         Optimal      811 - 129     mg/dL         Near or Above Optimal      130 - 159     mg/dL         Borderline High      160 - 189     mg/dL         High       > 914        mg/dL         Very High   78/05/9560   Lab Results  Component Value Date   TRIG 617* 04/01/2012   Lab Results  Component Value Date   CHOLHDL 6.0 04/01/2012     Assessment & Plan  HTN (hypertension) Blood pressure well controlled, no changes today.  HYPERLIPIDEMIA Adequately controlled except fo triglycerides, avoid simple carbs and increase Niacin  Onychomycosis Soak feet in distilled white vinegar, vick's vapor rub and referred to podiatry  GLUCOSE INTOLERANCE hgba1c 5.9 avoid simple carbs, increase exercise

## 2012-09-10 LAB — RENAL FUNCTION PANEL
BUN: 18 mg/dL (ref 6–23)
Chloride: 104 mEq/L (ref 96–112)
Glucose, Bld: 98 mg/dL (ref 70–99)
Phosphorus: 3.2 mg/dL (ref 2.3–4.6)
Potassium: 4.2 mEq/L (ref 3.5–5.3)

## 2012-09-10 LAB — HEPATIC FUNCTION PANEL
AST: 29 U/L (ref 0–37)
Albumin: 4.5 g/dL (ref 3.5–5.2)
Alkaline Phosphatase: 93 U/L (ref 39–117)
Indirect Bilirubin: 0.7 mg/dL (ref 0.0–0.9)
Total Bilirubin: 0.8 mg/dL (ref 0.3–1.2)

## 2012-09-10 LAB — LIPID PANEL
HDL: 50 mg/dL (ref >39)
LDL Cholesterol: 82 mg/dL (ref 0–99)
Total CHOL/HDL Ratio: 3.6 Ratio
VLDL: 48 mg/dL — ABNORMAL HIGH (ref 0–40)

## 2012-09-10 NOTE — Assessment & Plan Note (Signed)
Blood pressure well controlled, no changes today.

## 2012-09-13 ENCOUNTER — Encounter: Payer: Self-pay | Admitting: Family Medicine

## 2012-09-13 DIAGNOSIS — N400 Enlarged prostate without lower urinary tract symptoms: Secondary | ICD-10-CM

## 2012-09-13 DIAGNOSIS — B351 Tinea unguium: Secondary | ICD-10-CM

## 2012-09-13 HISTORY — DX: Tinea unguium: B35.1

## 2012-09-13 HISTORY — DX: Benign prostatic hyperplasia without lower urinary tract symptoms: N40.0

## 2012-09-13 NOTE — Assessment & Plan Note (Signed)
Soak feet in distilled white vinegar, vick's vapor rub and referred to podiatry

## 2012-09-13 NOTE — Assessment & Plan Note (Signed)
hgba1c 5.9 avoid simple carbs, increase exercise

## 2012-09-13 NOTE — Assessment & Plan Note (Signed)
Adequately controlled except fo triglycerides, avoid simple carbs and increase Niacin

## 2012-10-30 ENCOUNTER — Other Ambulatory Visit: Payer: Self-pay | Admitting: Family Medicine

## 2012-10-31 NOTE — Telephone Encounter (Signed)
Rx request to pharmacy/SLS  

## 2012-11-08 ENCOUNTER — Encounter: Payer: Self-pay | Admitting: Podiatry

## 2012-11-08 ENCOUNTER — Ambulatory Visit (INDEPENDENT_AMBULATORY_CARE_PROVIDER_SITE_OTHER): Payer: Federal, State, Local not specified - PPO | Admitting: Podiatry

## 2012-11-08 VITALS — Ht 66.0 in | Wt 165.0 lb

## 2012-11-08 DIAGNOSIS — M205X2 Other deformities of toe(s) (acquired), left foot: Secondary | ICD-10-CM | POA: Insufficient documentation

## 2012-11-08 DIAGNOSIS — M205X9 Other deformities of toe(s) (acquired), unspecified foot: Secondary | ICD-10-CM

## 2012-11-08 DIAGNOSIS — L609 Nail disorder, unspecified: Secondary | ICD-10-CM

## 2012-11-08 DIAGNOSIS — L608 Other nail disorders: Secondary | ICD-10-CM | POA: Insufficient documentation

## 2012-11-08 NOTE — Progress Notes (Signed)
Subjective: 62 year old male presents complaining of discolored thick toe nail on left great toe for over 2 years. Been treating for 6 months with OTC antifungal drops. He feels the nail has improved but still not normal looking. He was referred by his PCP Dr. Abner Greenspan.  Objective: Vascular: All pedal pulses are palpable. No sign of trophic changes. No edema or erythema noted. Dermatologic: No open lesions. Thick dystrophic nail with minimum discoloration and dorsally raised ridge at medial 1/2 right great toe. No subungual debris noted. Orthopedic: Limited dorsiflexion at first MPJ upon loading of the forefoot, L>R. Normal range of motion at rear foot and mid foot bilateral. Normal lower limb muscle strength noted.  Mildly dorsiflexed first Metatarsal bilateral L>R. Neurologic: All epicritic and tactile sensations grossly intact.  Assessment: Hallux limitus L>R. Deformed nail left great toe secondary to retrograde force in shoe box due to Hallux limitus.   Plan: Reviewed clinical findings and available treatment options.  Patient is to continue to use OTC antifungal drops as needed.  Return as needed.

## 2012-11-27 ENCOUNTER — Other Ambulatory Visit: Payer: Self-pay | Admitting: Family Medicine

## 2013-01-13 ENCOUNTER — Encounter: Payer: Federal, State, Local not specified - PPO | Admitting: Family Medicine

## 2013-01-13 DIAGNOSIS — Z0289 Encounter for other administrative examinations: Secondary | ICD-10-CM

## 2013-01-17 ENCOUNTER — Other Ambulatory Visit: Payer: Self-pay | Admitting: Family Medicine

## 2013-02-16 ENCOUNTER — Other Ambulatory Visit: Payer: Self-pay | Admitting: Family Medicine

## 2013-03-24 ENCOUNTER — Other Ambulatory Visit: Payer: Self-pay | Admitting: Family Medicine

## 2013-03-25 NOTE — Telephone Encounter (Signed)
Patient returned phone call. He states that he will be out of town for the next few months. He scheduled cpe for early may/2015. He wants to see Dr. Abner Greenspan regarding his medications before he leaves. 1st available for that appointment is after Christmas. I told patient that I would call him for that appointment if we have any cancellations.

## 2013-03-25 NOTE — Telephone Encounter (Signed)
Received request from [facility] for refills on nexium and atorvastatin.  Nexium was previously sent on 02/16/13, #90 x 1 refill; denial sent with note to use refill on file.  Atorvastatin refill due and sent #90 x no refills. Pt was due for physical in September and no showed appt and has no future appts on file.  Please call pt to rescheduled physical before further refills are needed.

## 2013-03-25 NOTE — Telephone Encounter (Signed)
Left message for patient to return my call.

## 2013-04-08 ENCOUNTER — Ambulatory Visit (INDEPENDENT_AMBULATORY_CARE_PROVIDER_SITE_OTHER): Payer: Federal, State, Local not specified - PPO | Admitting: Family Medicine

## 2013-04-08 ENCOUNTER — Encounter: Payer: Self-pay | Admitting: Family Medicine

## 2013-04-08 ENCOUNTER — Telehealth: Payer: Self-pay | Admitting: Family Medicine

## 2013-04-08 VITALS — BP 142/92 | HR 78 | Temp 98.0°F | Ht 65.5 in | Wt 184.1 lb

## 2013-04-08 DIAGNOSIS — E785 Hyperlipidemia, unspecified: Secondary | ICD-10-CM

## 2013-04-08 DIAGNOSIS — Z0000 Encounter for general adult medical examination without abnormal findings: Secondary | ICD-10-CM

## 2013-04-08 DIAGNOSIS — E739 Lactose intolerance, unspecified: Secondary | ICD-10-CM

## 2013-04-08 DIAGNOSIS — L989 Disorder of the skin and subcutaneous tissue, unspecified: Secondary | ICD-10-CM

## 2013-04-08 DIAGNOSIS — I10 Essential (primary) hypertension: Secondary | ICD-10-CM

## 2013-04-08 DIAGNOSIS — Z23 Encounter for immunization: Secondary | ICD-10-CM

## 2013-04-08 MED ORDER — MAGIC MOUTHWASH: ORAL | Status: DC

## 2013-04-08 NOTE — Progress Notes (Signed)
Pre visit review using our clinic review tool, if applicable. No additional management support is needed unless otherwise documented below in the visit note. 

## 2013-04-08 NOTE — Telephone Encounter (Signed)
Lab order placed.

## 2013-04-08 NOTE — Patient Instructions (Addendum)
Probiotic such as Digestive Advantage daily <145/95 at rest  DASH Diet The DASH diet stands for "Dietary Approaches to Stop Hypertension." It is a healthy eating plan that has been shown to reduce high blood pressure (hypertension) in as little as 14 days, while also possibly providing other significant health benefits. These other health benefits include reducing the risk of breast cancer after menopause and reducing the risk of type 2 diabetes, heart disease, colon cancer, and stroke. Health benefits also include weight loss and slowing kidney failure in patients with chronic kidney disease.  DIET GUIDELINES  Limit salt (sodium). Your diet should contain less than 1500 mg of sodium daily.  Limit refined or processed carbohydrates. Your diet should include mostly whole grains. Desserts and added sugars should be used sparingly.  Include small amounts of heart-healthy fats. These types of fats include nuts, oils, and tub margarine. Limit saturated and trans fats. These fats have been shown to be harmful in the body. CHOOSING FOODS  The following food groups are based on a 2000 calorie diet. See your Registered Dietitian for individual calorie needs. Grains and Grain Products (6 to 8 servings daily)  Eat More Often: Whole-wheat bread, brown rice, whole-grain or wheat pasta, quinoa, popcorn without added fat or salt (air popped).  Eat Less Often: White bread, white pasta, white rice, cornbread. Vegetables (4 to 5 servings daily)  Eat More Often: Fresh, frozen, and canned vegetables. Vegetables may be raw, steamed, roasted, or grilled with a minimal amount of fat.  Eat Less Often/Avoid: Creamed or fried vegetables. Vegetables in a cheese sauce. Fruit (4 to 5 servings daily)  Eat More Often: All fresh, canned (in natural juice), or frozen fruits. Dried fruits without added sugar. One hundred percent fruit juice ( cup [237 mL] daily).  Eat Less Often: Dried fruits with added sugar. Canned  fruit in light or heavy syrup. [employer], Fish, and Poultry (2 servings or less daily. One serving is 3 to 4 oz [85-114 g]).  Eat More Often: Ninety percent or leaner ground beef, tenderloin, sirloin. Round cuts of beef, chicken breast, [place] breast. All fish. Grill, bake, or broil your meat. Nothing should be fried.  Eat Less Often/Avoid: Fatty cuts of meat, [place], or chicken leg, thigh, or wing. Fried cuts of meat or fish. Dairy (2 to 3 servings)  Eat More Often: Low-fat or fat-free milk, low-fat plain or light yogurt, reduced-fat or part-skim cheese.  Eat Less Often/Avoid: Milk (whole, 2%).Whole milk yogurt. Full-fat cheeses. Nuts, Seeds, and Legumes (4 to 5 servings per week)  Eat More Often: All without added salt.  Eat Less Often/Avoid: Salted nuts and seeds, canned beans with added salt. Fats and Sweets (limited)  Eat More Often: Vegetable oils, tub margarines without trans fats, sugar-free gelatin. Mayonnaise and salad dressings.  Eat Less Often/Avoid: Coconut oils, palm oils, butter, stick margarine, cream, half and half, cookies, candy, pie. FOR MORE INFORMATION The Dash Diet Eating Plan: www.dashdiet.org Document Released: 03/30/2011 Document Revised: 07/03/2011 Document Reviewed: 03/30/2011 [facility] Patient Information 2014 [place], [place].

## 2013-04-08 NOTE — Telephone Encounter (Signed)
Lab order week of 6-1-201Annual with labs prior lipid, renal, cbc, tsh, psa, hgba1c, hepatic 5

## 2013-04-09 ENCOUNTER — Telehealth: Payer: Self-pay | Admitting: *Deleted

## 2013-04-09 NOTE — Telephone Encounter (Signed)
Received message from pt's wife at 10:22am requesting refill of pt's nystatin. Left detailed message at home # that refill was sent on 04/08/13 and to check with pharmacy this afternoon and call if pharmacy still doesn't have rx.

## 2013-04-09 NOTE — Telephone Encounter (Signed)
[facility] called regarding pt's Rx for Magic Mouthwash stating that they need the Ratios for this medication/SLS Please Advise.

## 2013-04-10 NOTE — Telephone Encounter (Signed)
30 ml Diphenhydramine, 60 ml Maalox, 4 g carafate, 2 % Lidocaine ( not sure of the standard ml for this, please clarify with pharmacist, I believe 30 ml) If he is unsure then do not give rx send back to me and I will try a different pharmacy. Disp QS and sig 5 ml po swish and spit qid prn pain.

## 2013-04-11 NOTE — Telephone Encounter (Signed)
I spoke to pharmacist Arline Asp and she states she filled it the same way it was done before

## 2013-04-13 ENCOUNTER — Encounter: Payer: Self-pay | Admitting: Family Medicine

## 2013-04-13 NOTE — Assessment & Plan Note (Signed)
Mild elevation improved on recheck, encouraged DASH diet and minimize caffeine

## 2013-04-13 NOTE — Assessment & Plan Note (Signed)
Avoid trans fats, increase exercise as tolerated, continue atorvastatin

## 2013-04-13 NOTE — Assessment & Plan Note (Signed)
Last hgba1c 5.9 agrees to return for repeat in next week.

## 2013-04-13 NOTE — Progress Notes (Signed)
Patient ID: Gregory Carr, male   DOB: 1951/02/29, 62 y.o.   MRN: [id number] KEALAN BUCHAN [id number] May 08, 1950 04/13/2013      Progress Note-Follow Up  Subjective  Chief Complaint  Chief Complaint  Patient presents with  . Follow-up  . Injections    prevnar    HPI  Patient is a 62 year old male who is in safe for followup on his medications. Is doing well. Has had an episode of some cold sores and some fatigue but otherwise denies, since last visit. Denies chest pain, palpitations, shortness of breath, GI or GU complaints. Adderall is helping with his attention. Taking meds as prescribed. No new concerns  Past Medical History  Diagnosis Date  . Onychomycosis 09/13/2012  . BPH (benign prostatic hyperplasia) 09/13/2012  . Renal cell carcinoma 12/15/2011    S/p left nephrectomy Follows with [facility]   . Other anxiety states 04/08/2007    Qualifier: Diagnosis of  By: Blossom Hoops MD, Luis    . HYPERLIPIDEMIA 08/16/2006    Qualifier: Diagnosis of  By: Blossom Hoops MD, Luis    . HTN (hypertension) 03/24/2011  . GLUCOSE INTOLERANCE 12/11/2007    Qualifier: Diagnosis of  By: Andrey Campanile MD, Raliegh Ip     . 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 GERD 08/16/2006    Qualifier: Diagnosis of  By: Blossom Hoops MD, Luis    . ERECTILE DYSFUNCTION 08/16/2006    Qualifier: Diagnosis of  By: Blossom Hoops MD, Luis    . BPH (benign prostatic hyperplasia) 09/13/2012    Follows with [facility]   . BARRETTS ESOPHAGUS 11/11/2007    Qualifier: Diagnosis of  By: Andrey Campanile MD, Raliegh Ip    . BACK PAIN, CHRONIC 11/11/2007    Qualifier: Diagnosis of  By: Andrey Campanile MD, Raliegh Ip      History reviewed. No pertinent past surgical history.  History reviewed. No pertinent family history.  History   Social History  . Marital Status: Married    Spouse Name: N/A    Number of Children: N/A  . Years of Education: N/A   Occupational History  . Not on file.   Social  History Main Topics  . Smoking status: Former Smoker    Types: Cigarettes  . Smokeless tobacco: Never Used  . Alcohol Use: Not on file  . Drug Use: Not on file  . Sexual Activity: Not on file   Other Topics Concern  . Not on file   Social History Narrative  . No narrative on file    Current Outpatient Prescriptions on File Prior to Visit  Medication Sig Dispense Refill  . amphetamine-dextroamphetamine (ADDERALL) 20 MG tablet Take 20 mg by mouth 3 (three) times daily.        Marland Kitchen atorvastatin (LIPITOR) 80 MG tablet TAKE 1 TABLET DAILY  90 tablet  0  . b complex vitamins capsule Take 1 capsule by mouth daily.      . Coenzyme Q10 (COQ-10) 400 MG CAPS Take 400 mg by mouth daily.      . diphenhydrAMINE (SOMINEX) 25 MG tablet Take 25 mg by mouth 2 (two) times daily as needed.      . Eszopiclone (ESZOPICLONE) 3 MG TABS Take 3 mg by mouth at bedtime. Take immediately before bedtime       . Fiber CAPS Take by mouth daily.      Marland Kitchen HYDROmorphone HCl (EXALGO) 8 MG TB24 Take 8 mg by mouth 2 (two) times daily.        Marland Kitchen  Magnesium 300 MG CAPS Take 300 mg by mouth daily.      . Misc Natural Products (COLON CLEANSER PO) Take 500 mg by mouth daily.      . Multiple Vitamins-Minerals (CENTRUM SILVER PO) Take by mouth daily.      . Multiple Vitamins-Minerals (OCUVITE PO) Take by mouth daily.      Marland Kitchen NEXIUM 40 MG capsule TAKE 1 CAPSULE DAILY BEFOREBREAKFAST  90 capsule  1  . NIASPAN 1000 MG CR tablet TAKE 1 TABLET DAILY  90 each  0  . NON FORMULARY Take 500 mg by mouth daily. Tumeric once a daily      . NON FORMULARY Take 750 mg by mouth daily. Guggle Extract once a day      . NON FORMULARY Test X 180 once tablet by mouth once a day-hormone supplment      . nystatin (MYCOSTATIN) 100000 UNIT/ML suspension SWISH AND SPIT 1 TEASPOONFUL 4 TIMES A DAY AS NEEDED  350 mL  0  . omega-3 acid ethyl esters (LOVAZA) 1 G capsule TAKE 2 CAPSULES (=2 GRAMS  TOTAL) TWO TIMES A DAY  360 capsule  0  . trazodone (DESYREL) 300  MG tablet Take 200 mg by mouth every evening.       . venlafaxine (EFFEXOR) 75 MG tablet Take 75 mg by mouth 2 (two) times daily.         No current facility-administered medications on file prior to visit.    No Known Allergies  Review of Systems  Review of Systems  Constitutional: Positive for malaise/fatigue. Negative for fever.  HENT: Negative for congestion.   Eyes: Negative for discharge.  Respiratory: Negative for shortness of breath.   Cardiovascular: Negative for chest pain, palpitations and leg swelling.  Gastrointestinal: Negative for nausea, abdominal pain and diarrhea.  Genitourinary: Negative for dysuria.  Musculoskeletal: Negative for falls.  Skin: Negative for rash.  Neurological: Negative for loss of consciousness and headaches.  Endo/Heme/Allergies: Negative for polydipsia.  Psychiatric/Behavioral: Negative for depression and suicidal ideas. The patient is not nervous/anxious and does not have insomnia.     Objective  BP 142/92  Pulse 78  Temp(Src) 98 F (36.7 C) (Oral)  Ht 5' 5.5" (1.664 m)  Wt 184 lb 1.3 oz (83.498 kg)  BMI 30.16 kg/m2  SpO2 95%  Physical Exam  Physical Exam  Constitutional: He is oriented to person, place, and time and well-developed, well-nourished, and in no distress. No distress.  HENT:  Head: Normocephalic and atraumatic.  Eyes: Conjunctivae are normal.  Neck: Neck supple. No thyromegaly present.  Cardiovascular: Normal rate, regular rhythm and normal heart sounds.   No murmur heard. Pulmonary/Chest: Effort normal and breath sounds normal. No respiratory distress.  Abdominal: He exhibits no distension and no mass. There is no tenderness.  Musculoskeletal: He exhibits no edema.  Neurological: He is alert and oriented to person, place, and time.  Skin: Skin is warm.  Psychiatric: Memory, affect and judgment normal.    Lab Results  Component Value Date   TSH 3.022 09/09/2012   Lab Results  Component Value Date   WBC 6.1  09/09/2012   HGB 14.4 09/09/2012   HCT 41.3 09/09/2012   MCV 85.9 09/09/2012   PLT 206 09/09/2012   Lab Results  Component Value Date   CREATININE 0.95 09/09/2012   BUN 18 09/09/2012   NA 141 09/09/2012   K 4.2 09/09/2012   CL 104 09/09/2012   CO2 30 09/09/2012   Lab Results  Component Value Date   ALT 36 09/09/2012   AST 29 09/09/2012   ALKPHOS 93 09/09/2012   BILITOT 0.8 09/09/2012   Lab Results  Component Value Date   CHOL 180 09/09/2012   Lab Results  Component Value Date   HDL 50 09/09/2012   Lab Results  Component Value Date   LDLCALC 82 09/09/2012   Lab Results  Component Value Date   TRIG 240* 09/09/2012   Lab Results  Component Value Date   CHOLHDL 3.6 09/09/2012     Assessment & Plan  HTN (hypertension) Mild elevation improved on recheck, encouraged DASH diet and minimize caffeine  GLUCOSE INTOLERANCE Last hgba1c 5.9 agrees to return for repeat in next week.  HYPERLIPIDEMIA Avoid trans fats, increase exercise as tolerated, continue atorvastatin

## 2013-04-14 ENCOUNTER — Other Ambulatory Visit: Payer: Self-pay | Admitting: Family Medicine

## 2013-04-14 NOTE — Telephone Encounter (Signed)
Rx request Denied; Too soon/SLS NEXIUM 40 MG capsule 90 capsule 1 02/16/2013     Sig: TAKE 1 CAPSULE DAILY BEFOREBREAKFAST    E-Prescribing Status: Receipt confirmed by pharmacy (02/17/2013 8:47 AM EDT)

## 2013-04-15 ENCOUNTER — Ambulatory Visit: Payer: Federal, State, Local not specified - PPO | Admitting: Family Medicine

## 2013-06-04 ENCOUNTER — Telehealth: Payer: Self-pay | Admitting: *Deleted

## 2013-06-04 NOTE — Telephone Encounter (Signed)
Received message from pt's wife that they left to go out of town after pt's last visit in December and he is currently at [facility] She is requesting that we fax pt's lab order to the [facility] at [phone]  Ph) [phone].  She states we could leave message at [phone].  Attempted to reach spouse and left message for her to call us back with further clarification about pt's stay at [facility].

## 2013-06-06 NOTE — Telephone Encounter (Signed)
Received message from pt's wife that they are on vacation in [place] and will be there for 1-2 more months and that pt is in good health. Order faxed to below number. Left detailed message on voicemail re: completion and to call if further questions.

## 2013-06-26 ENCOUNTER — Other Ambulatory Visit: Payer: Self-pay | Admitting: Family Medicine

## 2013-07-01 ENCOUNTER — Telehealth: Payer: Self-pay

## 2013-07-01 NOTE — Telephone Encounter (Signed)
Patients wife called stating that pt went to do his bloodwork at [facility] and the orders werent there.  I refaxed the orders and notified wife

## 2013-07-29 ENCOUNTER — Encounter: Payer: Self-pay | Admitting: Family Medicine

## 2013-09-02 ENCOUNTER — Ambulatory Visit: Payer: Federal, State, Local not specified - PPO | Admitting: Family Medicine

## 2013-09-29 ENCOUNTER — Encounter: Payer: Self-pay | Admitting: Family Medicine

## 2013-09-29 ENCOUNTER — Telehealth: Payer: Self-pay | Admitting: Family Medicine

## 2013-09-29 ENCOUNTER — Ambulatory Visit (INDEPENDENT_AMBULATORY_CARE_PROVIDER_SITE_OTHER): Payer: Federal, State, Local not specified - PPO | Admitting: Family Medicine

## 2013-09-29 VITALS — BP 138/82 | HR 92 | Temp 97.8°F | Ht 65.5 in | Wt 181.1 lb

## 2013-09-29 DIAGNOSIS — K219 Gastro-esophageal reflux disease without esophagitis: Secondary | ICD-10-CM

## 2013-09-29 DIAGNOSIS — F411 Generalized anxiety disorder: Secondary | ICD-10-CM

## 2013-09-29 DIAGNOSIS — E785 Hyperlipidemia, unspecified: Secondary | ICD-10-CM

## 2013-09-29 DIAGNOSIS — Z0000 Encounter for general adult medical examination without abnormal findings: Secondary | ICD-10-CM

## 2013-09-29 DIAGNOSIS — I10 Essential (primary) hypertension: Secondary | ICD-10-CM

## 2013-09-29 DIAGNOSIS — E739 Lactose intolerance, unspecified: Secondary | ICD-10-CM

## 2013-09-29 DIAGNOSIS — N400 Enlarged prostate without lower urinary tract symptoms: Secondary | ICD-10-CM

## 2013-09-29 HISTORY — DX: Encounter for general adult medical examination without abnormal findings: Z00.00

## 2013-09-29 MED ORDER — TAMSULOSIN HCL 0.4 MG PO CAPS: 0.4000 mg | ORAL_CAPSULE | Freq: Every day | ORAL | Status: DC

## 2013-09-29 NOTE — Progress Notes (Signed)
Patient ID: Gregory Carr, male   DOB: 04-13-1951, 63 y.o.   MRN: [id number] Gregory Carr [id number] [birth year] 09/29/2013      Progress Note-Follow Up  Subjective  Chief Complaint  Chief Complaint  Patient presents with  . Annual Exam    physical    HPI  Patient is a 63 year old male in today for routine medical care. He is in today for annual exam. Overall feeling well. Had lab work done in March of 2015 they showed mild hyperglycemia and hyperlipidemia but were otherwise unremarkable. Continues to follow with psychiatry. For pain management. Does note some urinary hesitancy and decreased flow and does have a known history of BPH. Denies dysuria, hematuria. No abdominal or back pain, fevers or chills. Denies CP/palp/SOB/HA/congestion/fevers/GI or GU c/o. Taking meds as prescribed  Past Medical History  Diagnosis Date  . Onychomycosis 09/13/2012  . BPH (benign prostatic hyperplasia) 09/13/2012  . Renal cell carcinoma 12/15/2011    S/p left nephrectomy Follows with [facility]   . Other anxiety states 04/08/2007    Qualifier: Diagnosis of  By: Cletus Gash MD, [place]    . HYPERLIPIDEMIA 08/16/2006    Qualifier: Diagnosis of  By: Cletus Gash MD, [place]    . HTN (hypertension) 03/24/2011  . GLUCOSE INTOLERANCE 12/11/2007    Qualifier: Diagnosis of  By: Redmond Pulling MD, Frann Rider     . [place] SYNDROME 01/22/2008    Qualifier: Diagnosis of  By: Redmond Pulling MD, Frann Rider    . GERD 08/16/2006    Qualifier: Diagnosis of  By: Cletus Gash MD, [place]    . ERECTILE DYSFUNCTION 08/16/2006    Qualifier: Diagnosis of  By: Cletus Gash MD, [place]    . BPH (benign prostatic hyperplasia) 09/13/2012    Follows with [facility]   . BARRETTS ESOPHAGUS 11/11/2007    Qualifier: Diagnosis of  By: Redmond Pulling MD, Frann Rider    . BACK PAIN, CHRONIC 11/11/2007    Qualifier: Diagnosis of  By: Redmond Pulling MD, Frann Rider      History reviewed. No pertinent past surgical history.  Family History  Problem Relation Age of Onset  .  Dementia Mother   . Cancer Daughter     brain cancer  . Hypertension Maternal Grandfather   . Heart disease Maternal Grandfather     MI  . Stroke Paternal Grandfather     History   Social History  . Marital Status: Married    Spouse Name: N/A    Number of Children: N/A  . Years of Education: N/A   Occupational History  . Not on file.   Social History Main Topics  . Smoking status: Former Smoker    Types: Cigarettes  . Smokeless tobacco: Never Used  . Alcohol Use: Yes     Comment: rare  . Drug Use: No  . Sexual Activity: Yes     Comment: lives with wife, no dietary restrictions, regular exercise   Other Topics Concern  . Not on file   Social History Narrative  . No narrative on file    Current Outpatient Prescriptions on File Prior to Visit  Medication Sig Dispense Refill  . Alum & Mag Hydroxide-Simeth (MAGIC MOUTHWASH) SOLN 1 Teaspoon Swish and spit four times daily as needed.  350 mL  1  . amphetamine-dextroamphetamine (ADDERALL) 20 MG tablet Take 20 mg by mouth 3 (three) times daily.        Marland Kitchen atorvastatin (LIPITOR) 80 MG tablet TAKE 1 TABLET DAILY. MAKE  AN APPOINTMENT FOR FURTHER REFILLS  90 tablet  0  . b complex vitamins capsule Take 1 capsule by mouth daily.      . Coenzyme Q10 (COQ-10) 400 MG CAPS Take 400 mg by mouth daily.      . diphenhydrAMINE (SOMINEX) 25 MG tablet Take 25 mg by mouth 2 (two) times daily as needed.      . Eszopiclone (ESZOPICLONE) 3 MG TABS Take 3 mg by mouth at bedtime. Take immediately before bedtime       . Fiber CAPS Take by mouth daily.      Marland Kitchen HYDROmorphone HCl (EXALGO) 8 MG TB24 Take 8 mg by mouth 2 (two) times daily.        . Magnesium 300 MG CAPS Take 300 mg by mouth daily.      . Misc Natural Products (COLON CLEANSER PO) Take 500 mg by mouth daily.      . Multiple Vitamins-Minerals (CENTRUM SILVER PO) Take by mouth daily.      . Multiple Vitamins-Minerals (OCUVITE PO) Take by mouth daily.      Marland Kitchen NEXIUM 40 MG capsule TAKE 1  CAPSULE DAILY BEFOREBREAKFAST  90 capsule  1  . NIASPAN 1000 MG CR tablet TAKE 1 TABLET DAILY  90 each  0  . NON FORMULARY Take 500 mg by mouth daily. Tumeric once a daily      . NON FORMULARY Take 750 mg by mouth daily. Guggle Extract once a day      . NON FORMULARY Test X 180 once tablet by mouth once a day-hormone supplment      . nystatin (MYCOSTATIN) 100000 UNIT/ML suspension SWISH AND SPIT 1 TEASPOONFUL 4 TIMES A DAY AS NEEDED  350 mL  0  . omega-3 acid ethyl esters (LOVAZA) 1 G capsule TAKE 2 CAPSULES (=2 GRAMS  TOTAL) TWO TIMES A DAY  360 capsule  0  . trazodone (DESYREL) 300 MG tablet Take 200 mg by mouth every evening.       . venlafaxine (EFFEXOR) 75 MG tablet Take 75 mg by mouth 2 (two) times daily.         No current facility-administered medications on file prior to visit.    No Known Allergies  Review of Systems  Review of Systems  Constitutional: Negative for fever and malaise/fatigue.  HENT: Negative for congestion.   Eyes: Negative for discharge.  Respiratory: Negative for shortness of breath.   Cardiovascular: Negative for chest pain, palpitations and leg swelling.  Gastrointestinal: Negative for nausea, abdominal pain and diarrhea.  Genitourinary: Negative for dysuria.  Musculoskeletal: Negative for falls.  Skin: Negative for rash.  Neurological: Negative for loss of consciousness and headaches.  Endo/Heme/Allergies: Negative for polydipsia.  Psychiatric/Behavioral: Negative for depression and suicidal ideas. The patient is not nervous/anxious and does not have insomnia.     Objective  BP 138/82  Pulse 92  Temp(Src) 97.8 F (36.6 C) (Oral)  Ht 5' 5.5" (1.664 m)  Wt 181 lb 1.3 oz (82.137 kg)  BMI 29.66 kg/m2  SpO2 93%  Physical Exam  Physical Exam  Constitutional: He is oriented to person, place, and time and well-developed, well-nourished, and in no distress. No distress.  HENT:  Head: Normocephalic and atraumatic.  Eyes: Conjunctivae are normal.   Neck: Neck supple. No thyromegaly present.  Cardiovascular: Normal rate, regular rhythm and normal heart sounds.   No murmur heard. Pulmonary/Chest: Effort normal and breath sounds normal. No respiratory distress.  Abdominal: He exhibits no distension and no mass. There is no tenderness.  Musculoskeletal: He exhibits  no edema.  Neurological: He is alert and oriented to person, place, and time.  Skin: Skin is warm.  Psychiatric: Memory, affect and judgment normal.    Lab Results  Component Value Date   TSH 3.022 09/09/2012   Lab Results  Component Value Date   WBC 6.1 09/09/2012   HGB 14.4 09/09/2012   HCT 41.3 09/09/2012   MCV 85.9 09/09/2012   PLT 206 09/09/2012   Lab Results  Component Value Date   CREATININE 0.95 09/09/2012   BUN 18 09/09/2012   NA 141 09/09/2012   K 4.2 09/09/2012   CL 104 09/09/2012   CO2 30 09/09/2012   Lab Results  Component Value Date   ALT 36 09/09/2012   AST 29 09/09/2012   ALKPHOS 93 09/09/2012   BILITOT 0.8 09/09/2012   Lab Results  Component Value Date   CHOL 180 09/09/2012   Lab Results  Component Value Date   HDL 50 09/09/2012   Lab Results  Component Value Date   LDLCALC 82 09/09/2012   Lab Results  Component Value Date   TRIG 240* 09/09/2012   Lab Results  Component Value Date   CHOLHDL 3.6 09/09/2012     Assessment & Plan  HTN (hypertension) Well controlled, no changes to meds. Encouraged heart healthy diet such as the DASH diet and exercise as tolerated.   BPH (benign prostatic hyperplasia) Will try Flomax and recheck symptoms at next visit.  GERD Avoid offending foods, start probiotics. Do not eat large meals in late evening and consider raising head of bed. Patient agrees to call GI and clarify when he is due for next colonoscopy  [place] with psychiatry  HYPERLIPIDEMIA Tolerating statin, encouraged heart healthy diet, avoid trans fats, minimize simple carbs and saturated fats. Increase exercise as  tolerated  GLUCOSE INTOLERANCE A1C 5.7 in March 2015. Encouraged minimize simple carbs and increase exercise. Reassess at next month  Preventative health care Patient encouraged to maintain heart healthy diet, regular exercise, adequate sleep. Consider daily probiotics. Take medications as prescribed

## 2013-09-29 NOTE — Patient Instructions (Addendum)
Labs 1 month and again in 4 months lipid, renal, cbc, tsh, hgba1c, hepatic 64 oz of fluids daily Probiotics daily [employer], [facility]  Basic Carbohydrate Counting Basic carbohydrate counting is a way to plan meals. It is done by counting the amount of carbohydrate in foods. Foods that have carbohydrates are starches (grains, beans, starchy vegetables) and sweets. Eating carbohydrates increases blood glucose (sugar) levels. People with diabetes use carbohydrate counting to help keep their blood glucose at a normal level.  COUNTING CARBOHYDRATES IN FOODS The first step in counting carbohydrates is to learn how many carbohydrate servings you should have in every meal. A dietitian can plan this for you. After learning the amount of carbohydrates to include in your meal plan, you can start to choose the carbohydrate-containing foods you want to eat.  There are 2 ways to identify the amount of carbohydrates in the foods you eat.  Read the Nutrition Facts panel on food labels. You need 2 pieces of information from the Nutrition Facts panel to count carbohydrates this way:  Serving size.  Total carbohydrate (in grams). Decide how many servings you will be eating. If it is 1 serving, you will be eating the amount of carbohydrate listed on the panel. If you will be eating 2 servings, you will be eating double the amount of carbohydrate listed on the panel.   Learn serving sizes. A serving size of most carbohydrate-containing foods is about 15 grams (g). Listed below are single serving sizes of common carbohydrate-containing foods:  1 slice bread.   cup unsweetened, dry cereal.   cup hot cereal.   cup rice.   cup mashed potatoes.   cup pasta.  1 cup fresh fruit.   cup canned fruit.  1 cup milk (whole, 2%, or skim).   cup starchy vegetables (peas, corn, or potatoes). Counting carbohydrates this way is similar to looking on the Nutrition Facts panel. Decide how  many servings you will eat first. Multiply the number of servings you eat by 15 g. For example, if you have 2 cups of strawberries, you had 2 servings. That means you had 30 g of carbohydrate (2 servings x 15 g = 30 g). CALCULATING CARBOHYDRATES IN A MEAL Sample dinner  3 oz chicken breast.   cup brown rice.   cup corn.  1 cup fat-free milk.  1 cup strawberries with sugar-free whipped topping. Carbohydrate calculation First, identify the foods that contain carbohydrate:  Rice.  Corn.  Milk.  Strawberries. Calculate the number of servings eaten:  2 servings rice.  1 serving corn.  1 serving milk.  1 serving strawberries. Multiply the number of servings by 15 g:  2 servings rice x 15 g = 30 g.  1 serving corn x 15 g = 15 g.  1 serving milk x 15 g = 15 g.  1 serving strawberries x 15 g = 15 g. Add the amounts to find the total carbohydrates eaten: 30 g + 15 g + 15 g + 15 g = 75 g carbohydrate eaten at dinner. Document Released: 04/10/2005 Document Revised: 07/03/2011 Document Reviewed: 02/24/2011 [facility] Patient Information 2014 [place], [place].

## 2013-09-29 NOTE — Assessment & Plan Note (Addendum)
Avoid offending foods, start probiotics. Do not eat large meals in late evening and consider raising head of bed. Patient agrees to call GI and clarify when he is due for next colonoscopy

## 2013-09-29 NOTE — Assessment & Plan Note (Signed)
Will try Flomax and recheck symptoms at next visit.

## 2013-09-29 NOTE — Telephone Encounter (Signed)
Relevant patient education assigned to patient using Emmi. ° °

## 2013-09-29 NOTE — Assessment & Plan Note (Signed)
Follows with psychiatry. °

## 2013-09-29 NOTE — Assessment & Plan Note (Signed)
A1C 5.7 in March 2015. Encouraged minimize simple carbs and increase exercise. Reassess at next month

## 2013-09-29 NOTE — Progress Notes (Signed)
Pre visit review using our clinic review tool, if applicable. No additional management support is needed unless otherwise documented below in the visit note. 

## 2013-09-29 NOTE — Assessment & Plan Note (Signed)
Well controlled, no changes to meds. Encouraged heart healthy diet such as the DASH diet and exercise as tolerated.  °

## 2013-09-29 NOTE — Assessment & Plan Note (Signed)
Tolerating statin, encouraged heart healthy diet, avoid trans fats, minimize simple carbs and saturated fats. Increase exercise as tolerated 

## 2013-09-29 NOTE — Assessment & Plan Note (Signed)
Patient encouraged to maintain heart healthy diet, regular exercise, adequate sleep. Consider daily probiotics. Take medications as prescribed 

## 2013-10-11 ENCOUNTER — Other Ambulatory Visit: Payer: Self-pay | Admitting: Family Medicine

## 2013-10-13 NOTE — Telephone Encounter (Signed)
Refill sent per [facility] refill protocol/SLS  

## 2013-12-07 ENCOUNTER — Other Ambulatory Visit: Payer: Self-pay | Admitting: Family Medicine

## 2013-12-08 ENCOUNTER — Other Ambulatory Visit: Payer: Self-pay | Admitting: Family Medicine

## 2013-12-08 NOTE — Telephone Encounter (Signed)
Rx sent to pharmacy. LDM 

## 2013-12-08 NOTE — Telephone Encounter (Signed)
Rx sent to pharmacy. Pt is due for an appt before anymore refills are given. Please call and schedule. LDM

## 2014-01-05 ENCOUNTER — Other Ambulatory Visit: Payer: Self-pay | Admitting: Family Medicine

## 2014-01-13 ENCOUNTER — Other Ambulatory Visit: Payer: Self-pay | Admitting: Family Medicine

## 2014-01-20 ENCOUNTER — Other Ambulatory Visit: Payer: Self-pay | Admitting: Family Medicine

## 2014-01-26 ENCOUNTER — Ambulatory Visit (INDEPENDENT_AMBULATORY_CARE_PROVIDER_SITE_OTHER): Payer: Federal, State, Local not specified - PPO | Admitting: Family Medicine

## 2014-01-26 ENCOUNTER — Encounter: Payer: Self-pay | Admitting: Family Medicine

## 2014-01-26 VITALS — BP 128/84 | HR 68 | Temp 98.3°F | Ht 65.5 in | Wt 179.8 lb

## 2014-01-26 DIAGNOSIS — N529 Male erectile dysfunction, unspecified: Secondary | ICD-10-CM

## 2014-01-26 DIAGNOSIS — F528 Other sexual dysfunction not due to a substance or known physiological condition: Secondary | ICD-10-CM

## 2014-01-26 DIAGNOSIS — E739 Lactose intolerance, unspecified: Secondary | ICD-10-CM

## 2014-01-26 DIAGNOSIS — K219 Gastro-esophageal reflux disease without esophagitis: Secondary | ICD-10-CM

## 2014-01-26 DIAGNOSIS — I10 Essential (primary) hypertension: Secondary | ICD-10-CM

## 2014-01-26 DIAGNOSIS — Z23 Encounter for immunization: Secondary | ICD-10-CM

## 2014-01-26 DIAGNOSIS — E785 Hyperlipidemia, unspecified: Secondary | ICD-10-CM

## 2014-01-26 LAB — HEPATIC FUNCTION PANEL
ALK PHOS: 80 U/L (ref 39–117)
ALT: 36 U/L (ref 0–53)
AST: 37 U/L (ref 0–37)
Albumin: 3.9 g/dL (ref 3.5–5.2)
BILIRUBIN DIRECT: 0.3 mg/dL (ref 0.0–0.3)
TOTAL PROTEIN: 7.3 g/dL (ref 6.0–8.3)
Total Bilirubin: 1.1 mg/dL (ref 0.2–1.2)

## 2014-01-26 LAB — RENAL FUNCTION PANEL
ALBUMIN: 3.9 g/dL (ref 3.5–5.2)
BUN: 18 mg/dL (ref 6–23)
CALCIUM: 8.9 mg/dL (ref 8.4–10.5)
CHLORIDE: 101 meq/L (ref 96–112)
CO2: 28 meq/L (ref 19–32)
Creatinine, Ser: 0.9 mg/dL (ref 0.4–1.5)
GFR: 93.34 mL/min (ref >60.00)
GLUCOSE: 98 mg/dL (ref 70–99)
Phosphorus: 2.7 mg/dL (ref 2.3–4.6)
Potassium: 4.4 mEq/L (ref 3.5–5.1)
SODIUM: 137 meq/L (ref 135–145)

## 2014-01-26 LAB — CBC
HCT: 43.5 % (ref 39.0–52.0)
Hemoglobin: 14.6 g/dL (ref 13.0–17.0)
MCHC: 33.6 g/dL (ref 30.0–36.0)
MCV: 93.4 fl (ref 78.0–100.0)
PLATELETS: 187 10*3/uL (ref 150.0–400.0)
RBC: 4.66 Mil/uL (ref 4.22–5.81)
RDW: 13.5 % (ref 11.5–15.5)
WBC: 5.1 10*3/uL (ref 4.0–10.5)

## 2014-01-26 LAB — LIPID PANEL
CHOLESTEROL: 167 mg/dL (ref 0–200)
HDL: 45.6 mg/dL (ref >39.00)
NonHDL: 121.4
Total CHOL/HDL Ratio: 4
Triglycerides: 229 mg/dL — ABNORMAL HIGH (ref 0.0–149.0)
VLDL: 45.8 mg/dL — AB (ref 0.0–40.0)

## 2014-01-26 LAB — TSH: TSH: 2.89 u[IU]/mL (ref 0.35–4.50)

## 2014-01-26 LAB — LDL CHOLESTEROL, DIRECT: Direct LDL: 81.5 mg/dL

## 2014-01-26 MED ORDER — SILDENAFIL CITRATE 20 MG PO TABS: ORAL_TABLET | ORAL | Status: DC

## 2014-01-26 NOTE — Patient Instructions (Signed)

## 2014-01-26 NOTE — Progress Notes (Signed)
Pre visit review using our clinic review tool, if applicable. No additional management support is needed unless otherwise documented below in the visit note. 

## 2014-02-01 ENCOUNTER — Encounter: Payer: Self-pay | Admitting: Family Medicine

## 2014-02-01 DIAGNOSIS — N529 Male erectile dysfunction, unspecified: Secondary | ICD-10-CM | POA: Insufficient documentation

## 2014-02-01 NOTE — Progress Notes (Signed)
Patient ID: Gregory Carr, male   DOB: [birth year], 63 y.o.   MRN: [id number] Gregory Carr [id number] January 31, 1951 02/01/2014      Progress Note-Follow Up  Subjective  Chief Complaint  Chief Complaint  Patient presents with  . Follow-up    4 month  . Injections    flu    HPI  Patient is a 63 year old male in today for routine medical care. Patient in today for followup. Generally feeling well. No recent illness. Denies any new or acute concerns. Denies CP/palp/SOB/HA/congestion/fevers/GI or GU c/o. Taking meds as prescribed  Past Medical History  Diagnosis Date  . Onychomycosis 09/13/2012  . BPH (benign prostatic hyperplasia) 09/13/2012  . Renal cell carcinoma 12/15/2011    S/p left nephrectomy Follows with [facility]   . Other anxiety states 04/08/2007    Qualifier: Diagnosis of  By: Cletus Gash MD, [place]    . HYPERLIPIDEMIA 08/16/2006    Qualifier: Diagnosis of  By: Cletus Gash MD, [place]    . HTN (hypertension) 03/24/2011  . GLUCOSE INTOLERANCE 12/11/2007    Qualifier: Diagnosis of  By: Redmond Pulling MD, Frann Rider     . [place] SYNDROME 01/22/2008    Qualifier: Diagnosis of  By: Redmond Pulling MD, Frann Rider    . GERD 08/16/2006    Qualifier: Diagnosis of  By: Cletus Gash MD, [place]    . ERECTILE DYSFUNCTION 08/16/2006    Qualifier: Diagnosis of  By: Cletus Gash MD, [place]    . BPH (benign prostatic hyperplasia) 09/13/2012    Follows with [facility]   . BARRETTS ESOPHAGUS 11/11/2007    Qualifier: Diagnosis of  By: Redmond Pulling MD, Frann Rider    . BACK PAIN, CHRONIC 11/11/2007    Qualifier: Diagnosis of  By: Redmond Pulling MD, Frann Rider    . Preventative health care 09/29/2013    History reviewed. No pertinent past surgical history.  Family History  Problem Relation Age of Onset  . Dementia Mother   . Cancer Daughter     brain cancer  . Hypertension Maternal Grandfather   . Heart disease Maternal Grandfather     MI  . Stroke Paternal Grandfather     History   Social History  . Marital Status:  Married    Spouse Name: N/A    Number of Children: N/A  . Years of Education: N/A   Occupational History  . Not on file.   Social History Main Topics  . Smoking status: Former Smoker    Types: Cigarettes  . Smokeless tobacco: Never Used  . Alcohol Use: Yes     Comment: rare  . Drug Use: No  . Sexual Activity: Yes     Comment: lives with wife, no dietary restrictions, regular exercise   Other Topics Concern  . Not on file   Social History Narrative  . No narrative on file    Current Outpatient Prescriptions on File Prior to Visit  Medication Sig Dispense Refill  . Alum & Mag Hydroxide-Simeth (MAGIC MOUTHWASH) SOLN 1 Teaspoon Swish and spit four times daily as needed.  350 mL  1  . amphetamine-dextroamphetamine (ADDERALL) 20 MG tablet Take 20 mg by mouth 3 (three) times daily.        Marland Kitchen atorvastatin (LIPITOR) 80 MG tablet TAKE 1 TABLET DAILY. MAKE  AN APPOINTMENT FOR FURTHER REFILLS  90 tablet  0  . b complex vitamins capsule Take 1 capsule by mouth daily.      . Coenzyme Q10 (COQ-10) 400 MG CAPS Take 400 mg by mouth daily.      Marland Kitchen  diphenhydrAMINE (SOMINEX) 25 MG tablet Take 25 mg by mouth 2 (two) times daily as needed.      . Eszopiclone (ESZOPICLONE) 3 MG TABS Take 3 mg by mouth at bedtime. Take immediately before bedtime       . Fiber CAPS Take by mouth daily.      Marland Kitchen HYDROmorphone HCl (EXALGO) 8 MG TB24 Take 8 mg by mouth 2 (two) times daily.        . Magnesium 300 MG CAPS Take 300 mg by mouth daily.      . Misc Natural Products (COLON CLEANSER PO) Take 500 mg by mouth daily.      . Multiple Vitamins-Minerals (CENTRUM SILVER PO) Take by mouth daily.      . Multiple Vitamins-Minerals (OCUVITE PO) Take by mouth daily.      Marland Kitchen NEXIUM 40 MG capsule TAKE 1 CAPSULE DAILY BEFOREBREAKFAST  90 capsule  1  . NIASPAN 1000 MG CR tablet TAKE 1 TABLET DAILY  90 each  0  . NON FORMULARY Take 500 mg by mouth daily. Tumeric once a daily      . NON FORMULARY Take 750 mg by mouth daily. Guggle  Extract once a day      . NON FORMULARY Test X 180 once tablet by mouth once a day-hormone supplment      . nystatin (MYCOSTATIN) 100000 UNIT/ML suspension SWISH AND SPIT 1 TEASPOONFUL 4 TIMES A DAY AS NEEDED  350 mL  0  . omega-3 acid ethyl esters (LOVAZA) 1 G capsule TAKE 2 CAPSULES (=2 GRAMS  TOTAL) TWO TIMES A DAY  360 capsule  0  . tamsulosin (FLOMAX) 0.4 MG CAPS capsule TAKE 1 CAPSULE (0.4 MG TOTAL) BY MOUTH DAILY.  30 capsule  2  . trazodone (DESYREL) 300 MG tablet Take 200 mg by mouth every evening.       . venlafaxine (EFFEXOR) 75 MG tablet Take 75 mg by mouth 2 (two) times daily.         No current facility-administered medications on file prior to visit.    No Known Allergies  Review of Systems  Review of Systems  Constitutional: Negative for fever and malaise/fatigue.  HENT: Negative for congestion.   Eyes: Negative for discharge.  Respiratory: Negative for shortness of breath.   Cardiovascular: Negative for chest pain, palpitations and leg swelling.  Gastrointestinal: Negative for nausea, abdominal pain and diarrhea.  Genitourinary: Negative for dysuria.  Musculoskeletal: Negative for falls.  Skin: Negative for rash.  Neurological: Negative for loss of consciousness and headaches.  Endo/Heme/Allergies: Negative for polydipsia.  Psychiatric/Behavioral: Negative for depression and suicidal ideas. The patient is not nervous/anxious and does not have insomnia.     Objective  BP 128/84  Pulse 68  Temp(Src) 98.3 F (36.8 C) (Oral)  Ht 5' 5.5" (1.664 m)  Wt 179 lb 12.8 oz (81.557 kg)  BMI 29.45 kg/m2  SpO2 97%  Physical Exam  Physical Exam  Constitutional: He is oriented to person, place, and time and well-developed, well-nourished, and in no distress. No distress.  HENT:  Head: Normocephalic and atraumatic.  Eyes: Conjunctivae are normal.  Neck: Neck supple. No thyromegaly present.  Cardiovascular: Normal rate, regular rhythm and normal heart sounds.   No  murmur heard. Pulmonary/Chest: Effort normal and breath sounds normal. No respiratory distress.  Abdominal: He exhibits no distension and no mass. There is no tenderness.  Musculoskeletal: He exhibits no edema.  Neurological: He is alert and oriented to person, place, and time.  Skin: Skin is warm.  Psychiatric: Memory, affect and judgment normal.    Lab Results  Component Value Date   TSH 2.89 01/26/2014   Lab Results  Component Value Date   WBC 5.1 01/26/2014   HGB 14.6 01/26/2014   HCT 43.5 01/26/2014   MCV 93.4 01/26/2014   PLT 187.0 01/26/2014   Lab Results  Component Value Date   CREATININE 0.9 01/26/2014   BUN 18 01/26/2014   NA 137 01/26/2014   K 4.4 01/26/2014   CL 101 01/26/2014   CO2 28 01/26/2014   Lab Results  Component Value Date   ALT 36 01/26/2014   AST 37 01/26/2014   ALKPHOS 80 01/26/2014   BILITOT 1.1 01/26/2014   Lab Results  Component Value Date   CHOL 167 01/26/2014   Lab Results  Component Value Date   HDL 45.60 01/26/2014   Lab Results  Component Value Date   LDLCALC 82 09/09/2012   Lab Results  Component Value Date   TRIG 229.0* 01/26/2014   Lab Results  Component Value Date   CHOLHDL 4 01/26/2014     Assessment & Plan  Essential hypertension Well controlled, no changes to meds. Encouraged heart healthy diet such as the DASH diet and exercise as tolerated.   GERD Avoid offending foods, start probiotics. Do not eat large meals in late evening and consider raising head of bed.   ERECTILE DYSFUNCTION Given an rx for Revatio, patient aware of possible side effects and concerns.   Hyperlipidemia Encouraged heart healthy diet, increase exercise, avoid trans fats, consider a krill oil cap daily  Disorder of bilirubin excretion All labs normal today  GLUCOSE INTOLERANCE  minimize simple carbs. Increase exercise as tolerated. Given flu shot today

## 2014-02-01 NOTE — Assessment & Plan Note (Signed)
Avoid offending foods, start probiotics. Do not eat large meals in late evening and consider raising head of bed.  

## 2014-02-01 NOTE — Assessment & Plan Note (Signed)
All labs normal today

## 2014-02-01 NOTE — Assessment & Plan Note (Signed)
minimize simple carbs. Increase exercise as tolerated. Given flu shot today

## 2014-02-01 NOTE — Assessment & Plan Note (Signed)
Encouraged heart healthy diet, increase exercise, avoid trans fats, consider a krill oil cap daily 

## 2014-02-01 NOTE — Assessment & Plan Note (Addendum)
Given an rx for Revatio, patient aware of possible side effects and concerns.

## 2014-02-01 NOTE — Assessment & Plan Note (Signed)
Well controlled, no changes to meds. Encouraged heart healthy diet such as the DASH diet and exercise as tolerated.  °

## 2014-03-02 ENCOUNTER — Other Ambulatory Visit: Payer: Self-pay | Admitting: Dermatology

## 2014-04-20 ENCOUNTER — Other Ambulatory Visit: Payer: Self-pay | Admitting: Family Medicine

## 2014-04-21 NOTE — Telephone Encounter (Signed)
Rx's sent to the pharmacy by e-script.//AB/CMA 

## 2014-05-02 ENCOUNTER — Other Ambulatory Visit: Payer: Self-pay | Admitting: Family Medicine

## 2014-05-04 NOTE — Telephone Encounter (Signed)
Last filled/amt: 01/13/14; 30, 2 refills Last PSA:  09/09/12 Next appt:  07/05/13 Med filled until next appointment.

## 2014-07-01 ENCOUNTER — Ambulatory Visit (INDEPENDENT_AMBULATORY_CARE_PROVIDER_SITE_OTHER): Payer: Federal, State, Local not specified - PPO | Admitting: Medical

## 2014-07-01 ENCOUNTER — Encounter: Payer: Self-pay | Admitting: Medical

## 2014-07-01 VITALS — BP 162/90 | HR 85 | Temp 98.2°F | Wt 186.4 lb

## 2014-07-01 DIAGNOSIS — R0789 Other chest pain: Secondary | ICD-10-CM

## 2014-07-01 DIAGNOSIS — I10 Essential (primary) hypertension: Secondary | ICD-10-CM

## 2014-07-01 LAB — EKG 12-LEAD

## 2014-07-01 MED ORDER — LISINOPRIL 10 MG PO TABS: 10.0000 mg | ORAL_TABLET | Freq: Every day | ORAL | Status: DC

## 2014-07-01 NOTE — Assessment & Plan Note (Signed)
Dash diet, mild exercise. Rx lisinopril. Follow up in 2 wks bp check or as needed. cmp and cbc today.

## 2014-07-01 NOTE — Progress Notes (Signed)
Subjective:    Patient ID: Gregory Carr, male    DOB: 1950/06/03, 64 y.o.   MRN: [id number]  HPI   Pt has no hx of htn. Over the past month on various MD visits and when he has been checking he has gotten 175/92 on average.  Pt recently  has had some moderate stress.  Mild occasional ha but no for 2 days. No associated neurologic signs or symptoms.  Pt 1 wk ago had chest pain lasted 10-15 minutes sharp level 3 pain. Pain went away. No nausea, no vomiting, no sweating, on sob, no jaw pain or any arm pain.   Pt states several heart work up in the past were negative. Stress test was negative. He works out 3 times a week.   Review of Systems  Constitutional: Negative for fever and chills.  Respiratory: Negative for cough, choking, shortness of breath and wheezing.   Cardiovascular: Negative for chest pain and palpitations.       Some chest pain 1 wk. None since.  Gastrointestinal: Negative for nausea, vomiting, abdominal pain, diarrhea, constipation, blood in stool, abdominal distention and anal bleeding.  Musculoskeletal: Negative for myalgias, back pain, joint swelling, arthralgias, gait problem, neck pain and neck stiffness.  Hematological: Negative for adenopathy. Does not bruise/bleed easily.   Past Medical History  Diagnosis Date  . Onychomycosis 09/13/2012  . BPH (benign prostatic hyperplasia) 09/13/2012  . Renal cell carcinoma 12/15/2011    S/p left nephrectomy Follows with [facility]   . Other anxiety states 04/08/2007    Qualifier: Diagnosis of  By: Cletus Gash MD, [place]    . HYPERLIPIDEMIA 08/16/2006    Qualifier: Diagnosis of  By: Cletus Gash MD, [place]    . HTN (hypertension) 03/24/2011  . GLUCOSE INTOLERANCE 12/11/2007    Qualifier: Diagnosis of  By: Redmond Pulling MD, Frann Rider     . [place] SYNDROME 01/22/2008    Qualifier: Diagnosis of  By: Redmond Pulling MD, Frann Rider    . GERD 08/16/2006    Qualifier: Diagnosis of  By: Cletus Gash MD, [place]    . ERECTILE DYSFUNCTION 08/16/2006   Qualifier: Diagnosis of  By: Cletus Gash MD, [place]    . BPH (benign prostatic hyperplasia) 09/13/2012    Follows with [facility]   . BARRETTS ESOPHAGUS 11/11/2007    Qualifier: Diagnosis of  By: Redmond Pulling MD, Frann Rider    . BACK PAIN, CHRONIC 11/11/2007    Qualifier: Diagnosis of  By: Redmond Pulling MD, Frann Rider    . Preventative health care 09/29/2013    History   Social History  . Marital Status: Married    Spouse Name: N/A  . Number of Children: N/A  . Years of Education: N/A   Occupational History  . Not on file.   Social History Main Topics  . Smoking status: Former Smoker    Types: Cigarettes  . Smokeless tobacco: Never Used  . Alcohol Use: Yes     Comment: rare  . Drug Use: No  . Sexual Activity: Yes     Comment: lives with wife, no dietary restrictions, regular exercise   Other Topics Concern  . Not on file   Social History Narrative    No past surgical history on file.  Family History  Problem Relation Age of Onset  . Dementia Mother   . Cancer Daughter     brain cancer  . Hypertension Maternal Grandfather   . Heart disease Maternal Grandfather     MI  . Stroke Paternal Grandfather     No  Known Allergies  Current Outpatient Prescriptions on File Prior to Visit  Medication Sig Dispense Refill  . Alum & Mag Hydroxide-Simeth (MAGIC MOUTHWASH) SOLN 1 Teaspoon Swish and spit four times daily as needed. 350 mL 1  . amphetamine-dextroamphetamine (ADDERALL) 20 MG tablet Take 20 mg by mouth 3 (three) times daily.      Marland Kitchen atorvastatin (LIPITOR) 80 MG tablet TAKE 1 TABLET BY MOUTH DAILY. 90 tablet 1  . b complex vitamins capsule Take 1 capsule by mouth daily.    . Coenzyme Q10 (COQ-10) 400 MG CAPS Take 400 mg by mouth daily.    . diphenhydrAMINE (SOMINEX) 25 MG tablet Take 25 mg by mouth 2 (two) times daily as needed.    . Eszopiclone (ESZOPICLONE) 3 MG TABS Take 3 mg by mouth at bedtime. Take immediately before bedtime     . Fiber CAPS Take by mouth daily.    Marland Kitchen  HYDROmorphone HCl (EXALGO) 8 MG TB24 Take 8 mg by mouth 2 (two) times daily.      . Magnesium 300 MG CAPS Take 300 mg by mouth daily.    . Misc Natural Products (COLON CLEANSER PO) Take 500 mg by mouth daily.    . Multiple Vitamins-Minerals (CENTRUM SILVER PO) Take by mouth daily.    . Multiple Vitamins-Minerals (OCUVITE PO) Take by mouth daily.    Marland Kitchen NEXIUM 40 MG capsule TAKE 1 CAPSULE DAILY BEFOREBREAKFAST 90 capsule 1  . NON FORMULARY Take 500 mg by mouth daily. Tumeric once a daily    . NON FORMULARY Take 750 mg by mouth daily. Guggle Extract once a day    . NON FORMULARY Test X 180 once tablet by mouth once a day-hormone supplment    . omega-3 acid ethyl esters (LOVAZA) 1 G capsule TAKE 2 CAPSULES (=2 GRAMS  TOTAL) TWO TIMES A DAY 360 capsule 1  . sildenafil (REVATIO) 20 MG tablet 1-4 tabs po daily prn ED 50 tablet 2  . tamsulosin (FLOMAX) 0.4 MG CAPS capsule TAKE ONE CAPSULE BY MOUTH EVERY DAY 30 capsule 2  . venlafaxine (EFFEXOR) 75 MG tablet Take 75 mg by mouth 2 (two) times daily.      Marland Kitchen nystatin (MYCOSTATIN) 100000 UNIT/ML suspension SWISH AND SPIT 1 TEASPOONFUL 4 TIMES A DAY AS NEEDED (Patient not taking: Reported on 07/01/2014) 350 mL 0  . trazodone (DESYREL) 300 MG tablet Take 200 mg by mouth every evening.      No current facility-administered medications on file prior to visit.    BP 162/90 mmHg  Pulse 85  Temp(Src) 98.2 F (36.8 C) (Oral)  Wt 186 lb 6.4 oz (84.55 kg)  SpO2 95%       Objective:   Physical Exam  General Mental Status- Alert. General Appearance- Not in acute distress.   Skin General: Color- Normal Color. Moisture- Normal Moisture.  Neck Carotid Arteries- Normal color. Moisture- Normal Moisture. No carotid bruits. No JVD.  Chest and Lung Exam Auscultation: Breath Sounds:-Normal.  Cardiovascular Auscultation:Rythm- Regular. Murmurs & Other Heart Sounds:Auscultation of the heart reveals- No Murmurs.  Abdomen Inspection:-Inspeection  Normal. Palpation/Percussion:Note:No mass. Palpation and Percussion of the abdomen reveal- Non Tender, Non Distended + BS, no rebound or guarding.    Neurologic Cranial Nerve exam:- CN III-XII intact(No nystagmus), symmetric smile. Drift Test:- No drift. Romberg Exam:- Negative.  Heal to Toe Gait exam:-Normal. Finger to Nose:- Normal/Intact Strength:- 5/5 equal and symmetric strength both upper and lower extremities.     Assessment & Plan:

## 2014-07-01 NOTE — Patient Instructions (Signed)
Essential hypertension Dash diet, mild exercise. Rx lisinopril. Follow up in 2 wks bp check or as needed. cmp and cbc today.   Atypical chest pain ekg today. Asked pt to get old reports from cardiologist. Or sign release form so we can get old records. If recurrent chest pain then ED evaluation.     Follow up in 2-3 wks or as needed.

## 2014-07-01 NOTE — Assessment & Plan Note (Addendum)
ekg today. Asked pt to get old reports from cardiologist. He signed release form so we can get old records. If recurrent chest pain then ED evaluation.  Ekg looks like possible bundle branch block. No prior ekg in our computer to compare that I can find. Pt is not symptomatic for one week   I asked him to sign a release form so we can get prior old cardiac work/ekg etc.  He will call back and give [place] name of the cardiologist. If during the interim he has any chest pain or any associated cardiac signs or symptoms then advised ED evaluation.Pt agreed with this plan.

## 2014-07-06 ENCOUNTER — Encounter: Payer: Federal, State, Local not specified - PPO | Admitting: Family Medicine

## 2014-07-19 ENCOUNTER — Telehealth: Payer: Self-pay | Admitting: Medical

## 2014-07-19 NOTE — Telephone Encounter (Signed)
Pt never got labs done as ordered. Am clearing this from over due labs.

## 2014-07-24 ENCOUNTER — Other Ambulatory Visit: Payer: Federal, State, Local not specified - PPO

## 2014-07-31 ENCOUNTER — Other Ambulatory Visit (INDEPENDENT_AMBULATORY_CARE_PROVIDER_SITE_OTHER): Payer: Federal, State, Local not specified - PPO

## 2014-07-31 ENCOUNTER — Other Ambulatory Visit: Payer: Self-pay

## 2014-07-31 DIAGNOSIS — E785 Hyperlipidemia, unspecified: Secondary | ICD-10-CM | POA: Diagnosis not present

## 2014-07-31 DIAGNOSIS — N528 Other male erectile dysfunction: Secondary | ICD-10-CM

## 2014-07-31 DIAGNOSIS — IMO0001 Reserved for inherently not codable concepts without codable children: Secondary | ICD-10-CM

## 2014-07-31 DIAGNOSIS — R030 Elevated blood-pressure reading, without diagnosis of hypertension: Secondary | ICD-10-CM

## 2014-07-31 LAB — COMPREHENSIVE METABOLIC PANEL
ALT: 29 U/L (ref 0–53)
AST: 24 U/L (ref 0–37)
Albumin: 4.3 g/dL (ref 3.5–5.2)
Alkaline Phosphatase: 89 U/L (ref 39–117)
BUN: 23 mg/dL (ref 6–23)
CALCIUM: 9.7 mg/dL (ref 8.4–10.5)
CO2: 32 meq/L (ref 19–32)
CREATININE: 0.93 mg/dL (ref 0.40–1.50)
Chloride: 104 mEq/L (ref 96–112)
GFR: 87.42 mL/min (ref >60.00)
Glucose, Bld: 111 mg/dL — ABNORMAL HIGH (ref 70–99)
Potassium: 5 mEq/L (ref 3.5–5.1)
Sodium: 140 mEq/L (ref 135–145)
TOTAL PROTEIN: 6.9 g/dL (ref 6.0–8.3)
Total Bilirubin: 1 mg/dL (ref 0.2–1.2)

## 2014-07-31 LAB — CBC WITH DIFFERENTIAL/PLATELET
Basophils Absolute: 0 10*3/uL (ref 0.0–0.1)
Basophils Relative: 0.3 % (ref 0.0–3.0)
EOS ABS: 0.1 10*3/uL (ref 0.0–0.7)
Eosinophils Relative: 1.4 % (ref 0.0–5.0)
HCT: 43.7 % (ref 39.0–52.0)
Hemoglobin: 15.1 g/dL (ref 13.0–17.0)
LYMPHS PCT: 26.4 % (ref 12.0–46.0)
Lymphs Abs: 1.3 10*3/uL (ref 0.7–4.0)
MCHC: 34.5 g/dL (ref 30.0–36.0)
MCV: 90.7 fl (ref 78.0–100.0)
Monocytes Absolute: 0.5 10*3/uL (ref 0.1–1.0)
Monocytes Relative: 9.4 % (ref 3.0–12.0)
NEUTROS ABS: 3.1 10*3/uL (ref 1.4–7.7)
NEUTROS PCT: 62.5 % (ref 43.0–77.0)
Platelets: 188 10*3/uL (ref 150.0–400.0)
RBC: 4.82 Mil/uL (ref 4.22–5.81)
RDW: 13.8 % (ref 11.5–15.5)
WBC: 4.9 10*3/uL (ref 4.0–10.5)

## 2014-07-31 LAB — PSA: PSA: 4.37 ng/mL — AB (ref 0.10–4.00)

## 2014-07-31 LAB — LIPID PANEL
CHOL/HDL RATIO: 4
Cholesterol: 171 mg/dL (ref 0–200)
HDL: 40.8 mg/dL (ref >39.00)
NonHDL: 130.2
TRIGLYCERIDES: 209 mg/dL — AB (ref 0.0–149.0)
VLDL: 41.8 mg/dL — AB (ref 0.0–40.0)

## 2014-07-31 LAB — TSH: TSH: 2.06 u[IU]/mL (ref 0.35–4.50)

## 2014-07-31 LAB — LDL CHOLESTEROL, DIRECT: LDL DIRECT: 82 mg/dL

## 2014-07-31 LAB — TESTOSTERONE: Testosterone: 207.23 ng/dL — ABNORMAL LOW (ref 300.00–890.00)

## 2014-08-04 ENCOUNTER — Other Ambulatory Visit: Payer: Self-pay | Admitting: Family Medicine

## 2014-08-04 DIAGNOSIS — R972 Elevated prostate specific antigen [PSA]: Secondary | ICD-10-CM

## 2014-08-04 DIAGNOSIS — R7989 Other specified abnormal findings of blood chemistry: Secondary | ICD-10-CM

## 2014-08-06 ENCOUNTER — Encounter: Payer: Self-pay | Admitting: Family Medicine

## 2014-08-17 ENCOUNTER — Other Ambulatory Visit: Payer: Self-pay | Admitting: Family Medicine

## 2014-08-21 ENCOUNTER — Other Ambulatory Visit: Payer: Self-pay | Admitting: Medical

## 2014-08-24 ENCOUNTER — Other Ambulatory Visit: Payer: Self-pay | Admitting: Medical

## 2014-08-24 NOTE — Telephone Encounter (Signed)
I will send him in a 30 day supply with 1 rf of LIsinopril but ask if he is willing to do a nurse only visit to see if his bp has improved from his last visit.

## 2014-08-24 NOTE — Telephone Encounter (Signed)
Left message  For patient to call and be placed on Nurse schedule for BP check . Advised of refill on Lisinopril.

## 2014-08-24 NOTE — Telephone Encounter (Signed)
Left message on patients answering machine for call back regarding Appointment for BP check up.

## 2014-09-01 NOTE — Telephone Encounter (Signed)
Called patient,left message for him to call back to be placed on Nurse schedule for BP check.

## 2014-09-02 NOTE — Telephone Encounter (Signed)
Left message on pt answering macihe fo call back.

## 2014-09-03 ENCOUNTER — Other Ambulatory Visit: Payer: Self-pay | Admitting: Family Medicine

## 2014-09-15 ENCOUNTER — Ambulatory Visit (INDEPENDENT_AMBULATORY_CARE_PROVIDER_SITE_OTHER): Payer: Federal, State, Local not specified - PPO | Admitting: Family Medicine

## 2014-09-15 VITALS — BP 140/78 | HR 84

## 2014-09-15 DIAGNOSIS — I10 Essential (primary) hypertension: Secondary | ICD-10-CM | POA: Diagnosis not present

## 2014-09-15 NOTE — Progress Notes (Signed)
Pre visit review using our clinic review tool, if applicable. No additional management support is needed unless otherwise documented below in the visit note. 

## 2014-09-27 NOTE — Progress Notes (Signed)
In today bor blood pressure check, numbers acceptable

## 2014-10-24 ENCOUNTER — Other Ambulatory Visit: Payer: Self-pay | Admitting: Medical

## 2014-11-21 ENCOUNTER — Other Ambulatory Visit: Payer: Self-pay | Admitting: Family Medicine

## 2014-12-07 ENCOUNTER — Encounter: Payer: Federal, State, Local not specified - PPO | Admitting: Family Medicine

## 2014-12-09 ENCOUNTER — Ambulatory Visit: Payer: Federal, State, Local not specified - PPO | Admitting: Medical

## 2014-12-31 ENCOUNTER — Telehealth: Payer: Self-pay | Admitting: Family Medicine

## 2014-12-31 MED ORDER — LISINOPRIL 10 MG PO TABS: 10.0000 mg | ORAL_TABLET | Freq: Every day | ORAL | Status: DC

## 2014-12-31 MED ORDER — TAMSULOSIN HCL 0.4 MG PO CAPS: 0.4000 mg | ORAL_CAPSULE | Freq: Every day | ORAL | Status: DC

## 2014-12-31 NOTE — Telephone Encounter (Signed)
Prescriptions requested sent in and called the patient left a detailed message refills done.

## 2014-12-31 NOTE — Telephone Encounter (Signed)
Caller name:Vary,Patricia Relation to EM:[id number]  Call back [place]: [facility] [place], [place] [phone] (Phone) [phone] (Fax)         Reason for call:  Spouse requesting a 90 day supply of lisinopril (PRINIVIL,ZESTRIL) 10 MG tablet and tamsulosin (FLOMAX) 0.4 MG CAPS capsule

## 2015-01-11 ENCOUNTER — Telehealth: Payer: Self-pay | Admitting: *Deleted

## 2015-01-11 ENCOUNTER — Encounter: Payer: Self-pay | Admitting: *Deleted

## 2015-01-11 NOTE — Telephone Encounter (Signed)
Unable to reach patient at time of Pre-Visit Call.  Left message for patient to return call when available.    

## 2015-01-12 ENCOUNTER — Ambulatory Visit (INDEPENDENT_AMBULATORY_CARE_PROVIDER_SITE_OTHER): Payer: Federal, State, Local not specified - PPO | Admitting: Family Medicine

## 2015-01-12 ENCOUNTER — Encounter: Payer: Self-pay | Admitting: Family Medicine

## 2015-01-12 VITALS — BP 112/78 | HR 80 | Temp 97.8°F | Ht 65.5 in | Wt 176.1 lb

## 2015-01-12 DIAGNOSIS — T753XXA Motion sickness, initial encounter: Secondary | ICD-10-CM | POA: Diagnosis not present

## 2015-01-12 DIAGNOSIS — R17 Unspecified jaundice: Secondary | ICD-10-CM

## 2015-01-12 DIAGNOSIS — Z0000 Encounter for general adult medical examination without abnormal findings: Secondary | ICD-10-CM | POA: Diagnosis not present

## 2015-01-12 DIAGNOSIS — I10 Essential (primary) hypertension: Secondary | ICD-10-CM

## 2015-01-12 DIAGNOSIS — E739 Lactose intolerance, unspecified: Secondary | ICD-10-CM

## 2015-01-12 DIAGNOSIS — K219 Gastro-esophageal reflux disease without esophagitis: Secondary | ICD-10-CM | POA: Diagnosis not present

## 2015-01-12 DIAGNOSIS — R1032 Left lower quadrant pain: Secondary | ICD-10-CM

## 2015-01-12 DIAGNOSIS — R1013 Epigastric pain: Secondary | ICD-10-CM

## 2015-01-12 DIAGNOSIS — E785 Hyperlipidemia, unspecified: Secondary | ICD-10-CM

## 2015-01-12 DIAGNOSIS — Z23 Encounter for immunization: Secondary | ICD-10-CM

## 2015-01-12 MED ORDER — SCOPOLAMINE 1 MG/3DAYS TD PT72: 1.0000 | MEDICATED_PATCH | TRANSDERMAL | Status: DC

## 2015-01-12 MED ORDER — HYOSCYAMINE SULFATE 0.125 MG SL SUBL: 0.1250 mg | SUBLINGUAL_TABLET | SUBLINGUAL | Status: DC | PRN

## 2015-01-12 NOTE — Patient Instructions (Addendum)
Call insurance and ask if they cover the shingles shot (Zostavax) if so write down who talked to, date and time' Call for nurse visit if you want it    reventive Care for Adults A healthy lifestyle and preventive care can promote health and wellness. Preventive health guidelines for men include the following key practices:  A routine yearly physical is a good way to check with your health care provider about your health and preventative screening. It is a chance to share any concerns and updates on your health and to receive a thorough exam.  Visit your dentist for a routine exam and preventative care every 6 months. Brush your teeth twice a day and floss once a day. Good oral hygiene prevents tooth decay and gum disease.  The frequency of eye exams is based on your age, health, family medical history, use of contact lenses, and other factors. Follow your health care provider's recommendations for frequency of eye exams.  Eat a healthy diet. Foods such as vegetables, fruits, whole grains, low-fat dairy products, and lean protein foods contain the nutrients you need without too many calories. Decrease your intake of foods high in solid fats, added sugars, and salt. Eat the right amount of calories for you.Get information about a proper diet from your health care provider, if necessary.  Regular physical exercise is one of the most important things you can do for your health. Most adults should get at least 150 minutes of moderate-intensity exercise (any activity that increases your heart rate and causes you to sweat) each week. In addition, most adults need muscle-strengthening exercises on 2 or more days a week.  Maintain a healthy weight. The body mass index (BMI) is a screening tool to identify possible weight problems. It provides an estimate of body fat based on height and weight. Your health care provider can find your BMI and can help you achieve or maintain a healthy weight.For adults 20  years and older:  A BMI below 18.5 is considered underweight.  A BMI of 18.5 to 24.9 is normal.  A BMI of 25 to 29.9 is considered overweight.  A BMI of 30 and above is considered obese.  Maintain normal blood lipids and cholesterol levels by exercising and minimizing your intake of saturated fat. Eat a balanced diet with plenty of fruit and vegetables. Blood tests for lipids and cholesterol should begin at age 69 and be repeated every 5 years. If your lipid or cholesterol levels are high, you are over 50, or you are at high risk for heart disease, you may need your cholesterol levels checked more frequently.Ongoing high lipid and cholesterol levels should be treated with medicines if diet and exercise are not working.  If you smoke, find out from your health care provider how to quit. If you do not use tobacco, do not start.  Lung cancer screening is recommended for adults aged 66-80 years who are at high risk for developing lung cancer because of a history of smoking. A yearly low-dose CT scan of the lungs is recommended for people who have at least a 30-pack-year history of smoking and are a current smoker or have quit within the past 15 years. A pack year of smoking is smoking an average of 1 pack of cigarettes a day for 1 year (for example: 1 pack a day for 30 years or 2 packs a day for 15 years). Yearly screening should continue until the smoker has stopped smoking for at least 15 years. Yearly  screening should be stopped for people who develop a health problem that would prevent them from having lung cancer treatment.  If you choose to drink alcohol, do not have more than 2 drinks per day. One drink is considered to be 12 ounces (355 mL) of beer, 5 ounces (148 mL) of wine, or 1.5 ounces (44 mL) of liquor.  Avoid use of street drugs. Do not share needles with anyone. Ask for help if you need support or instructions about stopping the use of drugs.  High blood pressure causes heart disease  and increases the risk of stroke. Your blood pressure should be checked at least every 1-2 years. Ongoing high blood pressure should be treated with medicines, if weight loss and exercise are not effective.  If you are 26-12 years old, ask your health care provider if you should take aspirin to prevent heart disease.  Diabetes screening involves taking a blood sample to check your fasting blood sugar level. This should be done once every 3 years, after age 41, if you are within normal weight and without risk factors for diabetes. Testing should be considered at a younger age or be carried out more frequently if you are overweight and have at least 1 risk factor for diabetes.  Colorectal cancer can be detected and often prevented. Most routine colorectal cancer screening begins at the age of 57 and continues through age 47. However, your health care provider may recommend screening at an earlier age if you have risk factors for colon cancer. On a yearly basis, your health care provider may provide home test kits to check for hidden blood in the stool. Use of a small camera at the end of a tube to directly examine the colon (sigmoidoscopy or colonoscopy) can detect the earliest forms of colorectal cancer. Talk to your health care provider about this at age 90, when routine screening begins. Direct exam of the colon should be repeated every 5-10 years through age 35, unless early forms of precancerous polyps or small growths are found.  People who are at an increased risk for hepatitis B should be screened for this virus. You are considered at high risk for hepatitis B if:  You were born in a country where hepatitis B occurs often. Talk with your health care provider about which countries are considered high risk.  Your parents were born in a high-risk country and you have not received a shot to protect against hepatitis B (hepatitis B vaccine).  You have HIV or AIDS.  You use needles to inject street  drugs.  You live with, or have sex with, someone who has hepatitis B.  You are a man who has sex with other men (MSM).  You get hemodialysis treatment.  You take certain medicines for conditions such as cancer, organ transplantation, and autoimmune conditions.  Hepatitis C blood testing is recommended for all people born from [birth year] through 1965 and any individual with known risks for hepatitis C.  Practice safe sex. Use condoms and avoid high-risk sexual practices to reduce the spread of sexually transmitted infections (STIs). STIs include gonorrhea, chlamydia, syphilis, trichomonas, herpes, HPV, and human immunodeficiency virus (HIV). Herpes, HIV, and HPV are viral illnesses that have no cure. They can result in disability, cancer, and death.  If you are at risk of being infected with HIV, it is recommended that you take a prescription medicine daily to prevent HIV infection. This is called preexposure prophylaxis (PrEP). You are considered at risk if:  You  are a man who has sex with other men (MSM) and have other risk factors.  You are a heterosexual man, are sexually active, and are at increased risk for HIV infection.  You take drugs by injection.  You are sexually active with a partner who has HIV.  Talk with your health care provider about whether you are at high risk of being infected with HIV. If you choose to begin PrEP, you should first be tested for HIV. You should then be tested every 3 months for as long as you are taking PrEP.  A one-time screening for abdominal aortic aneurysm (AAA) and surgical repair of large AAAs by ultrasound are recommended for men ages 1 to 71 years who are current or former smokers.  Healthy men should no longer receive prostate-specific antigen (PSA) blood tests as part of routine cancer screening. Talk with your health care provider about prostate cancer screening.  Testicular cancer screening is not recommended for adult males who have no  symptoms. Screening includes self-exam, a health care provider exam, and other screening tests. Consult with your health care provider about any symptoms you have or any concerns you have about testicular cancer.  Use sunscreen. Apply sunscreen liberally and repeatedly throughout the day. You should seek shade when your shadow is shorter than you. Protect yourself by wearing long sleeves, pants, a wide-brimmed hat, and sunglasses year round, whenever you are outdoors.  Once a month, do a whole-body skin exam, using a mirror to look at the skin on your back. Tell your health care provider about new moles, moles that have irregular borders, moles that are larger than a pencil eraser, or moles that have changed in shape or color.  Stay current with required vaccines (immunizations).  Influenza vaccine. All adults should be immunized every year.  Tetanus, diphtheria, and acellular pertussis (Td, Tdap) vaccine. An adult who has not previously received Tdap or who does not know his vaccine status should receive 1 dose of Tdap. This initial dose should be followed by tetanus and diphtheria toxoids (Td) booster doses every 10 years. Adults with an unknown or incomplete history of completing a 3-dose immunization series with Td-containing vaccines should begin or complete a primary immunization series including a Tdap dose. Adults should receive a Td booster every 10 years.  Varicella vaccine. An adult without evidence of immunity to varicella should receive 2 doses or a second dose if he has previously received 1 dose.  Human papillomavirus (HPV) vaccine. Males aged 83-21 years who have not received the vaccine previously should receive the 3-dose series. Males aged 22-26 years may be immunized. Immunization is recommended through the age of 56 years for any male who has sex with males and did not get any or all doses earlier. Immunization is recommended for any person with an immunocompromised condition  through the age of 63 years if he did not get any or all doses earlier. During the 3-dose series, the second dose should be obtained 4-8 weeks after the first dose. The third dose should be obtained 24 weeks after the first dose and 16 weeks after the second dose.  Zoster vaccine. One dose is recommended for adults aged 35 years or older unless certain conditions are present.  Measles, mumps, and rubella (MMR) vaccine. Adults born before [birth year] generally are considered immune to measles and mumps. Adults born in [birth year] or later should have 1 or more doses of MMR vaccine unless there is a contraindication to the vaccine or there  is laboratory evidence of immunity to each of the three diseases. A routine second dose of MMR vaccine should be obtained at least 28 days after the first dose for students attending postsecondary schools, health care workers, or international travelers. People who received inactivated measles vaccine or an unknown type of measles vaccine during 1963-1967 should receive 2 doses of MMR vaccine. People who received inactivated mumps vaccine or an unknown type of mumps vaccine before 1979 and are at high risk for mumps infection should consider immunization with 2 doses of MMR vaccine. Unvaccinated health care workers born before [birth year] who lack laboratory evidence of measles, mumps, or rubella immunity or laboratory confirmation of disease should consider measles and mumps immunization with 2 doses of MMR vaccine or rubella immunization with 1 dose of MMR vaccine.  Pneumococcal 13-valent conjugate (PCV13) vaccine. When indicated, a person who is uncertain of his immunization history and has no record of immunization should receive the PCV13 vaccine. An adult aged 57 years or older who has certain medical conditions and has not been previously immunized should receive 1 dose of PCV13 vaccine. This PCV13 should be followed with a dose of pneumococcal polysaccharide (PPSV23) vaccine. The PPSV23  vaccine dose should be obtained at least 8 weeks after the dose of PCV13 vaccine. An adult aged 65 years or older who has certain medical conditions and previously received 1 or more doses of PPSV23 vaccine should receive 1 dose of PCV13. The PCV13 vaccine dose should be obtained 1 or more years after the last PPSV23 vaccine dose.  Pneumococcal polysaccharide (PPSV23) vaccine. When PCV13 is also indicated, PCV13 should be obtained first. All adults aged 23 years and older should be immunized. An adult younger than age 40 years who has certain medical conditions should be immunized. Any person who resides in a nursing home or long-term care facility should be immunized. An adult smoker should be immunized. People with an immunocompromised condition and certain other conditions should receive both PCV13 and PPSV23 vaccines. People with human immunodeficiency virus (HIV) infection should be immunized as soon as possible after diagnosis. Immunization during chemotherapy or radiation therapy should be avoided. Routine use of PPSV23 vaccine is not recommended for American Indians, [place] Natives, or people younger than 65 years unless there are medical conditions that require PPSV23 vaccine. When indicated, people who have unknown immunization and have no record of immunization should receive PPSV23 vaccine. One-time revaccination 5 years after the first dose of PPSV23 is recommended for people aged 19-64 years who have chronic kidney failure, nephrotic syndrome, asplenia, or immunocompromised conditions. People who received 1-2 doses of PPSV23 before age 33 years should receive another dose of PPSV23 vaccine at age 42 years or later if at least 5 years have passed since the previous dose. Doses of PPSV23 are not needed for people immunized with PPSV23 at or after age 48 years.  Meningococcal vaccine. Adults with asplenia or persistent complement component deficiencies should receive 2 doses of quadrivalent  meningococcal conjugate (MenACWY-D) vaccine. The doses should be obtained at least 2 months apart. Microbiologists working with certain meningococcal bacteria, [place] recruits, people at risk during an outbreak, and people who travel to or live in countries with a high rate of meningitis should be immunized. A first-year college student up through age 45 years who is living in a residence hall should receive a dose if he did not receive a dose on or after his 16th birthday. Adults who have certain high-risk conditions should receive one or  more doses of vaccine.  Hepatitis A vaccine. Adults who wish to be protected from this disease, have certain high-risk conditions, work with hepatitis A-infected animals, work in hepatitis A research labs, or travel to or work in countries with a high rate of hepatitis A should be immunized. Adults who were previously unvaccinated and who anticipate close contact with an international adoptee during the first 60 days after arrival in the [place] from a country with a high rate of hepatitis A should be immunized.  Hepatitis B vaccine. Adults should be immunized if they wish to be protected from this disease, have certain high-risk conditions, may be exposed to blood or other infectious body fluids, are household contacts or sex partners of hepatitis B positive people, are clients or workers in certain care facilities, or travel to or work in countries with a high rate of hepatitis B.  Haemophilus influenzae type b (Hib) vaccine. A previously unvaccinated person with asplenia or sickle cell disease or having a scheduled splenectomy should receive 1 dose of Hib vaccine. Regardless of previous immunization, a recipient of a hematopoietic stem cell transplant should receive a 3-dose series 6-12 months after his successful transplant. Hib vaccine is not recommended for adults with HIV infection. Preventive Service / Frequency Ages 29 to 75  Blood pressure check.** /  Every 1 to 2 years.  Lipid and cholesterol check.** / Every 5 years beginning at age 70.  Hepatitis C blood test.** / For any individual with known risks for hepatitis C.  Skin self-exam. / Monthly.  Influenza vaccine. / Every year.  Tetanus, diphtheria, and acellular pertussis (Tdap, Td) vaccine.** / Consult your health care provider. 1 dose of Td every 10 years.  Varicella vaccine.** / Consult your health care provider.  HPV vaccine. / 3 doses over 6 months, if 27 or younger.  Measles, mumps, rubella (MMR) vaccine.** / You need at least 1 dose of MMR if you were born in 1957 or later. You may also need a second dose.  Pneumococcal 13-valent conjugate (PCV13) vaccine.** / Consult your health care provider.  Pneumococcal polysaccharide (PPSV23) vaccine.** / 1 to 2 doses if you smoke cigarettes or if you have certain conditions.  Meningococcal vaccine.** / 1 dose if you are age 99 to 40 years and a Market researcher living in a residence hall, or have one of several medical conditions. You may also need additional booster doses.  Hepatitis A vaccine.** / Consult your health care provider.  Hepatitis B vaccine.** / Consult your health care provider.  Haemophilus influenzae type b (Hib) vaccine.** / Consult your health care provider. Ages 70 to 38  Blood pressure check.** / Every 1 to 2 years.  Lipid and cholesterol check.** / Every 5 years beginning at age 71.  Lung cancer screening. / Every year if you are aged 95-80 years and have a 30-pack-year history of smoking and currently smoke or have quit within the past 15 years. Yearly screening is stopped once you have quit smoking for at least 15 years or develop a health problem that would prevent you from having lung cancer treatment.  Fecal occult blood test (FOBT) of stool. / Every year beginning at age 74 and continuing until age 2. You may not have to do this test if you get a colonoscopy every 10 years.  Flexible  sigmoidoscopy** or colonoscopy.** / Every 5 years for a flexible sigmoidoscopy or every 10 years for a colonoscopy beginning at age 102 and continuing until age 71.  Hepatitis C blood test.** / For all people born from [birth year] through 1965 and any individual with known risks for hepatitis C.  Skin self-exam. / Monthly.  Influenza vaccine. / Every year.  Tetanus, diphtheria, and acellular pertussis (Tdap/Td) vaccine.** / Consult your health care provider. 1 dose of Td every 10 years.  Varicella vaccine.** / Consult your health care provider.  Zoster vaccine.** / 1 dose for adults aged 41 years or older.  Measles, mumps, rubella (MMR) vaccine.** / You need at least 1 dose of MMR if you were born in 1956 or later. You may also need a second dose.  Pneumococcal 13-valent conjugate (PCV13) vaccine.** / Consult your health care provider.  Pneumococcal polysaccharide (PPSV23) vaccine.** / 1 to 2 doses if you smoke cigarettes or if you have certain conditions.  Meningococcal vaccine.** / Consult your health care provider.  Hepatitis A vaccine.** / Consult your health care provider.  Hepatitis B vaccine.** / Consult your health care provider.  Haemophilus influenzae type b (Hib) vaccine.** / Consult your health care provider. Ages 18 and over  Blood pressure check.** / Every 1 to 2 years.  Lipid and cholesterol check.**/ Every 5 years beginning at age 45.  Lung cancer screening. / Every year if you are aged 20-80 years and have a 30-pack-year history of smoking and currently smoke or have quit within the past 15 years. Yearly screening is stopped once you have quit smoking for at least 15 years or develop a health problem that would prevent you from having lung cancer treatment.  Fecal occult blood test (FOBT) of stool. / Every year beginning at age 45 and continuing until age 34. You may not have to do this test if you get a colonoscopy every 10 years.  Flexible sigmoidoscopy** or  colonoscopy.** / Every 5 years for a flexible sigmoidoscopy or every 10 years for a colonoscopy beginning at age 23 and continuing until age 66.  Hepatitis C blood test.** / For all people born from [birth year] through 1964 and any individual with known risks for hepatitis C.  Abdominal aortic aneurysm (AAA) screening.** / A one-time screening for ages 20 to 64 years who are current or former smokers.  Skin self-exam. / Monthly.  Influenza vaccine. / Every year.  Tetanus, diphtheria, and acellular pertussis (Tdap/Td) vaccine.** / 1 dose of Td every 10 years.  Varicella vaccine.** / Consult your health care provider.  Zoster vaccine.** / 1 dose for adults aged 71 years or older.  Pneumococcal 13-valent conjugate (PCV13) vaccine.** / Consult your health care provider.  Pneumococcal polysaccharide (PPSV23) vaccine.** / 1 dose for all adults aged 87 years and older.  Meningococcal vaccine.** / Consult your health care provider.  Hepatitis A vaccine.** / Consult your health care provider.  Hepatitis B vaccine.** / Consult your health care provider.  Haemophilus influenzae type b (Hib) vaccine.** / Consult your health care provider. **Family history and personal history of risk and conditions may change your health care provider's recommendations. Document Released: 06/06/2001 Document Revised: 04/15/2013 Document Reviewed: 09/05/2010 [facility] Patient Information 2015 [place], [place]. This information is not intended to replace advice given to you by your health care provider. Make sure you discuss any questions you have with your health care provider.

## 2015-01-12 NOTE — Progress Notes (Signed)
Pre visit review using our clinic review tool, if applicable. No additional management support is needed unless otherwise documented below in the visit note. 

## 2015-01-15 ENCOUNTER — Other Ambulatory Visit: Payer: Federal, State, Local not specified - PPO

## 2015-01-17 ENCOUNTER — Encounter: Payer: Self-pay | Admitting: Family Medicine

## 2015-01-17 DIAGNOSIS — T753XXA Motion sickness, initial encounter: Secondary | ICD-10-CM | POA: Insufficient documentation

## 2015-01-17 DIAGNOSIS — R1032 Left lower quadrant pain: Secondary | ICD-10-CM

## 2015-01-17 HISTORY — DX: Left lower quadrant pain: R10.32

## 2015-01-17 NOTE — Assessment & Plan Note (Addendum)
Patient encouraged to maintain heart healthy diet, regular exercise, adequate sleep. Consider daily probiotics. Take medications as prescribed. Given and reviewed copy of ACP documents from [employer] and encouraged to complete and return. Labs reviewed. Given flu shot today

## 2015-01-17 NOTE — Progress Notes (Signed)
Subjective:    Patient ID: Gregory Carr, male    DOB: [birth year], 64 y.o.   MRN: [id number]  Chief Complaint  Patient presents with  . Annual Exam    HPI Patient is in today for annual exam. Is leaving on a cruise and is requesting medications for motion sickness. Is following with [facility] Dr. Diona Fanti and offers no new urinary complaints. No recent illness. Denies fevers and chills. Denies CP/palp/SOB/HA/congestion/fevers/GI or GU c/o. Taking meds as prescribed  Past Medical History  Diagnosis Date  . Onychomycosis 09/13/2012  . BPH (benign prostatic hyperplasia) 09/13/2012  . Renal cell carcinoma 12/15/2011    S/p left nephrectomy Follows with [facility]   . Other anxiety states 04/08/2007    Qualifier: Diagnosis of  By: Cletus Gash MD, [place]    . HYPERLIPIDEMIA 08/16/2006    Qualifier: Diagnosis of  By: Cletus Gash MD, [place]    . HTN (hypertension) 03/24/2011  . GLUCOSE INTOLERANCE 12/11/2007    Qualifier: Diagnosis of  By: Redmond Pulling MD, Frann Rider     . [place] SYNDROME 01/22/2008    Qualifier: Diagnosis of  By: Redmond Pulling MD, Frann Rider    . GERD 08/16/2006    Qualifier: Diagnosis of  By: Cletus Gash MD, [place]    . ERECTILE DYSFUNCTION 08/16/2006    Qualifier: Diagnosis of  By: Cletus Gash MD, [place]    . BPH (benign prostatic hyperplasia) 09/13/2012    Follows with [facility]   . BARRETTS ESOPHAGUS 11/11/2007    Qualifier: Diagnosis of  By: Redmond Pulling MD, Frann Rider    . BACK PAIN, CHRONIC 11/11/2007    Qualifier: Diagnosis of  By: Redmond Pulling MD, Frann Rider    . Preventative health care 09/29/2013  . Abdominal pain, left lower quadrant 01/17/2015    History reviewed. No pertinent past surgical history.  Family History  Problem Relation Age of Onset  . Dementia Mother   . Cancer Daughter     brain cancer  . Hypertension Maternal Grandfather   . Heart disease Maternal Grandfather     MI  . Stroke Paternal Grandfather   . Arthritis Sister     Social History   Social History    . Marital Status: Married    Spouse Name: N/A  . Number of Children: N/A  . Years of Education: N/A   Occupational History  . Not on file.   Social History Main Topics  . Smoking status: Former Smoker    Types: Cigarettes  . Smokeless tobacco: Never Used  . Alcohol Use: Yes     Comment: rare  . Drug Use: No  . Sexual Activity: Yes     Comment: lives with wife, no dietary restrictions, regular exercise   Other Topics Concern  . Not on file   Social History Narrative    Outpatient Prescriptions Prior to Visit  Medication Sig Dispense Refill  . amphetamine-dextroamphetamine (ADDERALL) 20 MG tablet Take 20 mg by mouth 3 (three) times daily.      Marland Kitchen atorvastatin (LIPITOR) 80 MG tablet TAKE 1 TABLET DAILY 90 tablet 1  . Coenzyme Q10 (COQ-10) 400 MG CAPS Take 400 mg by mouth daily.    . diphenhydrAMINE (SOMINEX) 25 MG tablet Take 25 mg by mouth 2 (two) times daily as needed.    Marland Kitchen esomeprazole (NEXIUM) 40 MG capsule TAKE 1 CAPSULE DAILY BEFOREBREAKFAST 90 capsule 1  . Eszopiclone (ESZOPICLONE) 3 MG TABS Take 3 mg by mouth at bedtime. Take immediately before bedtime     . Fiber CAPS Take  by mouth daily.    Marland Kitchen HYDROmorphone HCl (EXALGO) 8 MG TB24 Take 8 mg by mouth 2 (two) times daily.      Marland Kitchen lisinopril (PRINIVIL,ZESTRIL) 10 MG tablet Take 1 tablet (10 mg total) by mouth daily. 90 tablet 1  . Misc Natural Products (COLON CLEANSER PO) Take 500 mg by mouth daily.    . Multiple Vitamins-Minerals (OCUVITE PO) Take by mouth daily.    . NON FORMULARY Take 500 mg by mouth daily. Tumeric once a daily    . omega-3 acid ethyl esters (LOVAZA) 1 G capsule TAKE 2 CAPSULES (=2 GRAMS  TOTAL) TWO TIMES A DAY 360 capsule 1  . tamsulosin (FLOMAX) 0.4 MG CAPS capsule Take 1 capsule (0.4 mg total) by mouth daily. 90 capsule 1  . trazodone (DESYREL) 300 MG tablet Take 150 mg by mouth every evening.     . venlafaxine (EFFEXOR) 75 MG tablet Take 75 mg by mouth 2 (two) times daily.      . Alum & Mag  Hydroxide-Simeth (MAGIC MOUTHWASH) SOLN 1 Teaspoon Swish and spit four times daily as needed. (Patient not taking: Reported on 01/11/2015) 350 mL 1  . b complex vitamins capsule Take 1 capsule by mouth daily.    . Magnesium 300 MG CAPS Take 300 mg by mouth daily.    . sildenafil (REVATIO) 20 MG tablet 1-4 tabs po daily prn ED (Patient not taking: Reported on 01/11/2015) 50 tablet 2   No facility-administered medications prior to visit.    No Known Allergies  Review of Systems  Constitutional: Negative for fever and malaise/fatigue.  HENT: Negative for congestion.   Eyes: Negative for discharge.  Respiratory: Negative for shortness of breath.   Cardiovascular: Negative for chest pain, palpitations and leg swelling.  Gastrointestinal: Negative for nausea and abdominal pain.  Genitourinary: Negative for dysuria.  Musculoskeletal: Negative for falls.  Skin: Negative for rash.  Neurological: Negative for loss of consciousness and headaches.  Endo/Heme/Allergies: Negative for environmental allergies.  Psychiatric/Behavioral: Negative for depression. The patient is not nervous/anxious.        Objective:    Physical Exam  Constitutional: He is oriented to person, place, and time. He appears well-developed and well-nourished. No distress.  HENT:  Head: Normocephalic and atraumatic.  Eyes: Conjunctivae are normal.  Neck: Neck supple. No thyromegaly present.  Cardiovascular: Normal rate, regular rhythm and normal heart sounds.   No murmur heard. Pulmonary/Chest: Effort normal and breath sounds normal. No respiratory distress. He has no wheezes.  Abdominal: Soft. Bowel sounds are normal. He exhibits no mass. There is no tenderness.  Musculoskeletal: He exhibits no edema.  Lymphadenopathy:    He has no cervical adenopathy.  Neurological: He is alert and oriented to person, place, and time.  Skin: Skin is warm and dry.  Psychiatric: He has a normal mood and affect. His behavior is normal.      BP 112/78 mmHg  Pulse 80  Temp(Src) 97.8 F (36.6 C) (Oral)  Ht 5' 5.5" (1.664 m)  Wt 176 lb 2 oz (79.89 kg)  BMI 28.85 kg/m2  SpO2 96% Wt Readings from Last 3 Encounters:  01/12/15 176 lb 2 oz (79.89 kg)  07/01/14 186 lb 6.4 oz (84.55 kg)  01/26/14 179 lb 12.8 oz (81.557 kg)     Lab Results  Component Value Date   WBC 4.9 07/31/2014   HGB 15.1 07/31/2014   HCT 43.7 07/31/2014   PLT 188.0 07/31/2014   GLUCOSE 111* 07/31/2014   CHOL 171  07/31/2014   TRIG 209.0* 07/31/2014   HDL 40.80 07/31/2014   LDLDIRECT 82.0 07/31/2014   LDLCALC 82 09/09/2012   ALT 29 07/31/2014   AST 24 07/31/2014   NA 140 07/31/2014   K 5.0 07/31/2014   CL 104 07/31/2014   CREATININE 0.93 07/31/2014   BUN 23 07/31/2014   CO2 32 07/31/2014   TSH 2.06 07/31/2014   PSA 4.37* 07/31/2014   HGBA1C 5.9* 09/09/2012    Lab Results  Component Value Date   TSH 2.06 07/31/2014   Lab Results  Component Value Date   WBC 4.9 07/31/2014   HGB 15.1 07/31/2014   HCT 43.7 07/31/2014   MCV 90.7 07/31/2014   PLT 188.0 07/31/2014   Lab Results  Component Value Date   NA 140 07/31/2014   K 5.0 07/31/2014   CO2 32 07/31/2014   GLUCOSE 111* 07/31/2014   BUN 23 07/31/2014   CREATININE 0.93 07/31/2014   BILITOT 1.0 07/31/2014   ALKPHOS 89 07/31/2014   AST 24 07/31/2014   ALT 29 07/31/2014   PROT 6.9 07/31/2014   ALBUMIN 4.3 07/31/2014   CALCIUM 9.7 07/31/2014   GFR 87.42 07/31/2014   Lab Results  Component Value Date   CHOL 171 07/31/2014   Lab Results  Component Value Date   HDL 40.80 07/31/2014   Lab Results  Component Value Date   LDLCALC 82 09/09/2012   Lab Results  Component Value Date   TRIG 209.0* 07/31/2014   Lab Results  Component Value Date   CHOLHDL 4 07/31/2014   Lab Results  Component Value Date   HGBA1C 5.9* 09/09/2012       Assessment & Plan:   Problem List Items Addressed This Visit    Preventative health care    Patient encouraged to maintain heart  healthy diet, regular exercise, adequate sleep. Consider daily probiotics. Take medications as prescribed. Given and reviewed copy of ACP documents from [employer] and encouraged to complete and return. Labs reviewed. Given flu shot today      Relevant Orders   Hepatitis C Antibody   HIV antibody (with reflex)   Motion sickness    Leaving on a cruise soon, given rx for scopolamine patches      Jaundice, non-neonatal   Relevant Medications   hyoscyamine (LEVSIN SL) 0.125 MG SL tablet   Other Relevant Orders   TSH   Hemoglobin A1c   Lipid panel   Comprehensive metabolic panel   CBC   Hyperlipidemia    Encouraged heart healthy diet, increase exercise, avoid trans fats, consider a krill oil cap daily      Relevant Medications   hyoscyamine (LEVSIN SL) 0.125 MG SL tablet   Other Relevant Orders   TSH   Hemoglobin A1c   Lipid panel   Comprehensive metabolic panel   CBC   GLUCOSE INTOLERANCE   Relevant Medications   hyoscyamine (LEVSIN SL) 0.125 MG SL tablet   Other Relevant Orders   TSH   Hemoglobin A1c   Lipid panel   Comprehensive metabolic panel   CBC   GERD (Chronic)    Avoid offending foods, start probiotics. Do not eat large meals in late evening and consider raising head of bed.       Relevant Medications   scopolamine (TRANSDERM-SCOP, 1.5 MG,) 1 MG/3DAYS   hyoscyamine (LEVSIN SL) 0.125 MG SL tablet   Other Relevant Orders   TSH   Hemoglobin A1c   Lipid panel   Comprehensive metabolic  panel   CBC   H. pylori antibody, IgG   Essential hypertension    Well controlled, no changes to meds. Encouraged heart healthy diet such as the DASH diet and exercise as tolerated.       Relevant Medications   hyoscyamine (LEVSIN SL) 0.125 MG SL tablet   Other Relevant Orders   TSH   Hemoglobin A1c   Lipid panel   Comprehensive metabolic panel   CBC   Abdominal pain, left lower quadrant    Colicky in nature, try Hyoscyamine prn, notify us if not efficient        Other Visit Diagnoses    Encounter for immunization    -  Primary    Abdominal pain, epigastric        Relevant Medications    hyoscyamine (LEVSIN SL) 0.125 MG SL tablet    Other Relevant Orders    TSH    Hemoglobin A1c    Lipid panel    Comprehensive metabolic panel    CBC    H. pylori antibody, IgG       I have discontinued Mr. Carcione b complex vitamins, Magnesium, magic mouthwash, and sildenafil. I am also having him start on scopolamine and hyoscyamine. Additionally, I am having him maintain his amphetamine-dextroamphetamine, venlafaxine, HYDROmorphone HCl, Eszopiclone, trazodone, NON FORMULARY, Multiple Vitamins-Minerals (OCUVITE PO), CoQ-10, Misc Natural Products (COLON CLEANSER PO), Fiber, diphenhydrAMINE, esomeprazole, omega-3 acid ethyl esters, atorvastatin, tamsulosin, and lisinopril.  Meds ordered this encounter  Medications  . scopolamine (TRANSDERM-SCOP, 1.5 MG,) 1 MG/3DAYS    Sig: Place 1 patch (1.5 mg total) onto the skin every 3 (three) days.    Dispense:  4 patch    Refill:  0  . hyoscyamine (LEVSIN SL) 0.125 MG SL tablet    Sig: Place 1 tablet (0.125 mg total) under the tongue every 4 (four) hours as needed.    Dispense:  30 tablet    Refill:  1     Penni Homans, MD

## 2015-01-17 NOTE — Assessment & Plan Note (Signed)
Encouraged heart healthy diet, increase exercise, avoid trans fats, consider a krill oil cap daily 

## 2015-01-17 NOTE — Assessment & Plan Note (Signed)
Leaving on a cruise soon, given rx for scopolamine patches

## 2015-01-17 NOTE — Assessment & Plan Note (Signed)
Colicky in nature, try Hyoscyamine prn, notify us if not efficient

## 2015-01-17 NOTE — Assessment & Plan Note (Signed)
Avoid offending foods, start probiotics. Do not eat large meals in late evening and consider raising head of bed.  

## 2015-01-17 NOTE — Assessment & Plan Note (Signed)
Well controlled, no changes to meds. Encouraged heart healthy diet such as the DASH diet and exercise as tolerated.  °

## 2015-01-24 ENCOUNTER — Other Ambulatory Visit: Payer: Self-pay | Admitting: Medical

## 2015-01-25 NOTE — Telephone Encounter (Signed)
Appears he got 90 tabs of lisinopril on sept 8, 2016. So I did not sign rx for 30 tabs with 2 refills. Am I correct?

## 2015-01-28 ENCOUNTER — Other Ambulatory Visit: Payer: Self-pay | Admitting: Family Medicine

## 2015-01-28 MED ORDER — ATORVASTATIN CALCIUM 80 MG PO TABS: 80.0000 mg | ORAL_TABLET | Freq: Every day | ORAL | Status: DC

## 2015-02-02 ENCOUNTER — Telehealth: Payer: Self-pay | Admitting: Family Medicine

## 2015-02-02 MED ORDER — ATORVASTATIN CALCIUM 80 MG PO TABS
80.0000 mg | ORAL_TABLET | Freq: Every day | ORAL | Status: DC
Start: 2015-02-02 — End: 2015-10-28

## 2015-02-02 MED ORDER — SCOPOLAMINE 1 MG/3DAYS TD PT72: 1.0000 | MEDICATED_PATCH | TRANSDERMAL | Status: DC

## 2015-02-02 NOTE — Telephone Encounter (Signed)
Pt wife  requesting refill on the patches scopolomine. She said he only got 3 or 4. [facility] [address].  Also a request was sent from mail order for atorvastatin.

## 2015-02-02 NOTE — Telephone Encounter (Signed)
[facility] [place] and atorvastatin to mail order pharmacy Called the patient on cell number left a detailed message prescriptions requested have been taken care of as requested.

## 2015-03-22 ENCOUNTER — Other Ambulatory Visit: Payer: Self-pay | Admitting: Family Medicine

## 2015-04-17 ENCOUNTER — Other Ambulatory Visit: Payer: Self-pay | Admitting: Family Medicine

## 2015-07-12 ENCOUNTER — Telehealth: Payer: Self-pay | Admitting: Family Medicine

## 2015-07-12 ENCOUNTER — Ambulatory Visit: Payer: Federal, State, Local not specified - PPO | Admitting: Family Medicine

## 2015-07-13 NOTE — Telephone Encounter (Signed)
No charge. 

## 2015-07-13 NOTE — Telephone Encounter (Signed)
Pt was no show 07/12/15 1:15pm for follow up appt, 1st no show w/in 12 months, pt has not rescheduled, charge or no charge?

## 2015-07-22 ENCOUNTER — Other Ambulatory Visit: Payer: Self-pay | Admitting: Family Medicine

## 2015-07-23 ENCOUNTER — Other Ambulatory Visit: Payer: Self-pay | Admitting: Family Medicine

## 2015-07-23 MED ORDER — LISINOPRIL 10 MG PO TABS: 10.0000 mg | ORAL_TABLET | Freq: Every day | ORAL | Status: DC

## 2015-07-23 MED ORDER — SCOPOLAMINE 1 MG/3DAYS TD PT72: 1.0000 | MEDICATED_PATCH | TRANSDERMAL | Status: DC

## 2015-07-29 ENCOUNTER — Telehealth: Payer: Self-pay | Admitting: Family Medicine

## 2015-07-29 MED ORDER — TAMSULOSIN HCL 0.4 MG PO CAPS: 0.4000 mg | ORAL_CAPSULE | Freq: Every day | ORAL | Status: DC

## 2015-07-29 NOTE — Telephone Encounter (Signed)
Sent in and patient aware 

## 2015-07-29 NOTE — Telephone Encounter (Signed)
Can be reached: [phone]  Pharm[facility]: [id number] [place], [place]  Reason for call: pt is out of tamsulosin. Please send refills to local pharmacy. Next f/u visit scheduled for 08/31/15

## 2015-08-31 ENCOUNTER — Telehealth: Payer: Self-pay | Admitting: Family Medicine

## 2015-08-31 ENCOUNTER — Ambulatory Visit: Payer: Federal, State, Local not specified - PPO | Admitting: Family Medicine

## 2015-09-13 NOTE — Telephone Encounter (Signed)
Pt was no show 08/31/15 for follow up appt, pt has reschedule in June, 2nd no show and 2 cancellations w/in 12 months, charge or no charge?

## 2015-09-13 NOTE — Telephone Encounter (Signed)
No charge. 

## 2015-10-11 ENCOUNTER — Ambulatory Visit: Payer: Federal, State, Local not specified - PPO | Admitting: Family Medicine

## 2015-10-12 ENCOUNTER — Telehealth: Payer: Self-pay | Admitting: Family Medicine

## 2015-10-12 ENCOUNTER — Encounter: Payer: Self-pay | Admitting: Family Medicine

## 2015-10-12 NOTE — Telephone Encounter (Signed)
Please start a discharge letter for loss of therapeutic relationship and charge

## 2015-10-12 NOTE — Telephone Encounter (Signed)
Patient was a No Show on 6/19  Previous No Show Dates: 08/31/2015 07/12/2015 01/15/2015  Charge or No Charge?

## 2015-10-12 NOTE — Telephone Encounter (Signed)
Letter has been printed and on counter for signature from PCP. PCP stated to charge for no show.

## 2015-10-13 ENCOUNTER — Encounter: Payer: Self-pay | Admitting: Family Medicine

## 2015-10-20 ENCOUNTER — Telehealth: Payer: Self-pay | Admitting: Family Medicine

## 2015-10-20 NOTE — Telephone Encounter (Signed)
Patient dismissed from [facility] by Penni Homans MD , effective October 12, 2015. Dismissal letter sent out by certified / registered mail.  DAJ  Received signed domestic return receipt verifying delivery of certified letter on October 27, 2015. Article number [id number] [place]

## 2015-10-28 ENCOUNTER — Encounter: Payer: Self-pay | Admitting: Family Medicine

## 2015-10-28 ENCOUNTER — Ambulatory Visit (INDEPENDENT_AMBULATORY_CARE_PROVIDER_SITE_OTHER): Payer: Federal, State, Local not specified - PPO | Admitting: Family Medicine

## 2015-10-28 VITALS — BP 138/88 | HR 71 | Temp 98.0°F | Ht 66.0 in | Wt 173.5 lb

## 2015-10-28 DIAGNOSIS — N400 Enlarged prostate without lower urinary tract symptoms: Secondary | ICD-10-CM

## 2015-10-28 DIAGNOSIS — K219 Gastro-esophageal reflux disease without esophagitis: Secondary | ICD-10-CM

## 2015-10-28 DIAGNOSIS — I10 Essential (primary) hypertension: Secondary | ICD-10-CM

## 2015-10-28 DIAGNOSIS — F4321 Adjustment disorder with depressed mood: Secondary | ICD-10-CM

## 2015-10-28 DIAGNOSIS — E782 Mixed hyperlipidemia: Secondary | ICD-10-CM | POA: Diagnosis not present

## 2015-10-28 MED ORDER — ATORVASTATIN CALCIUM 80 MG PO TABS: 80.0000 mg | ORAL_TABLET | Freq: Every day | ORAL | Status: DC

## 2015-10-28 MED ORDER — VENLAFAXINE HCL 75 MG PO TABS: 75.0000 mg | ORAL_TABLET | Freq: Three times a day (TID) | ORAL | Status: DC

## 2015-10-28 MED ORDER — TAMSULOSIN HCL 0.4 MG PO CAPS: 0.4000 mg | ORAL_CAPSULE | Freq: Every day | ORAL | Status: DC

## 2015-10-28 MED ORDER — LISINOPRIL 10 MG PO TABS: 10.0000 mg | ORAL_TABLET | Freq: Every day | ORAL | Status: DC

## 2015-10-28 NOTE — Patient Instructions (Signed)
Complicated Grieving Grief is a normal response to the death of someone close to you. Feelings of fear, anger, and guilt can affect almost everyone who loses a loved one. It is also common to have symptoms of depression while you are grieving. These include problems with sleep, loss of appetite, and lack of energy. They may last for weeks or months after a loss. Complicated grief is different from normal grief or depression. Normal grieving involves sadness and feelings of loss, but these feelings are not constant. Complicated grief is a constant and severe type of grief. It interferes with your ability to function normally. It may last for several months to a year or longer. Complicated grief may require treatment from a mental health care provider. CAUSES  It is not known why some people continue to struggle with grief and others do not. You may be at higher risk for complicated grief if:  The death of your loved one was sudden or unexpected.  The death of your loved one was due to a violent event.  Your loved one committed suicide.  Your loved one was a child or a young person.  You were very close to or dependent on the loved one.  You have a history of depression. SIGNS AND SYMPTOMS Signs and symptoms of complicated grief may include:  Feeling disbelief or numbness.  Being unable to enjoy good memories of your loved one.  Needing to avoid anything that reminds you of your loved one.  Being unable to stop thinking about the death.  Feeling intense anger or guilt.  Feeling alone and hopeless.  Feeling that your life is meaningless and empty.  Losing the desire to live. DIAGNOSIS Your health care provider may diagnose complicated grief if:  You have constant symptoms of grief for 6-12 months or longer.  Your symptoms are interfering with your ability to live your life. Your health care provider may want you to see a mental health care provider. Many symptoms of depression  are similar to the symptoms of complicated grief. It is important to be evaluated for complicated grief along with other mental health conditions. TREATMENT  Talk therapy with a mental health provider is the most common treatment for complicated grief. During therapy, you will learn healthy ways to cope with the loss of your loved one. In some cases, your mental health care provider may also recommend antidepressant medicines. HOME CARE INSTRUCTIONS  Take care of yourself.  Eat regular meals and maintain a healthy diet. Eat plenty of fruits, vegetables, and whole grains.  Try to get some exercise each day.  Keep regular hours for sleep. Try to get at least 8 hours of sleep each night.  Do not use drugs or alcohol to ease your symptoms.  Take medicines only as directed by your health care provider.  Spend time with friends and loved ones.  Consider joining a grief (bereavement) support group to help you deal with your loss.  Keep all follow-up visits as directed by your health care provider. This is important. SEEK MEDICAL CARE IF:  Your symptoms keep you from functioning normally.  Your symptoms do not get better with treatment. SEEK IMMEDIATE MEDICAL CARE IF:  You have serious thoughts of hurting yourself or someone else.  You have suicidal feelings.   This information is not intended to replace advice given to you by your health care provider. Make sure you discuss any questions you have with your health care provider.   Document Released: 04/10/2005   Document Revised: 12/30/2014 Document Reviewed: 09/18/2013 Elsevier Interactive Patient Education 2016 [employer]  

## 2015-10-28 NOTE — Progress Notes (Signed)
Pre visit review using our clinic review tool, if applicable. No additional management support is needed unless otherwise documented below in the visit note. 

## 2015-10-28 NOTE — Progress Notes (Signed)
Patient ID: Gregory Carr, male   DOB: November 08, 1950, 65 y.o.   MRN: [id number]   Subjective:    Patient ID: Gregory Carr, male    DOB: 1950-12-19, 65 y.o.   MRN: [id number]  Chief Complaint  Patient presents with  . Follow-up    HPI Patient is in today for follow up. Unfortunately he was widowed earlier this year when his wife died suddenly in Apr 28, 2015. He has had trouble keeping appointments and organizing his life but he is managing day to day. Reports he has a good support system. Denies CP/palp/SOB/HA/congestion/fevers/GI or GU c/o. Taking meds as prescribed  Past Medical History  Diagnosis Date  . Onychomycosis 09/13/2012  . BPH (benign prostatic hyperplasia) 09/13/2012  . Renal cell carcinoma ([place]) 12/15/2011    S/p left nephrectomy Follows with [facility]   . Other anxiety states 04/08/2007    Qualifier: Diagnosis of  By: Cletus Gash MD, [place]    . HYPERLIPIDEMIA 08/16/2006    Qualifier: Diagnosis of  By: Cletus Gash MD, [place]    . HTN (hypertension) 03/24/2011  . GLUCOSE INTOLERANCE 12/11/2007    Qualifier: Diagnosis of  By: Redmond Pulling MD, [place] R[place]     . [place] SYNDROME 01/22/2008    Qualifier: Diagnosis of  By: Redmond Pulling MD, Frann Rider    . GERD 08/16/2006    Qualifier: Diagnosis of  By: Cletus Gash MD, [place]    . ERECTILE DYSFUNCTION 08/16/2006    Qualifier: Diagnosis of  By: Cletus Gash MD, [place]    . BPH (benign prostatic hyperplasia) 09/13/2012    Follows with [facility]   . BARRETTS ESOPHAGUS 11/11/2007    Qualifier: Diagnosis of  By: Redmond Pulling MD, Frann Rider    . BACK PAIN, CHRONIC 11/11/2007    Qualifier: Diagnosis of  By: Redmond Pulling MD, Frann Rider    . Preventative health care 09/29/2013  . Abdominal pain, left lower quadrant 01/17/2015  . Grief reaction 11/07/2015    No past surgical history on file.  Family History  Problem Relation Age of Onset  . Dementia Mother   . Cancer Daughter     brain cancer  . Hypertension Maternal Grandfather   . Heart disease Maternal  Grandfather     MI  . Stroke Paternal Grandfather   . Arthritis Sister     Social History   Social History  . Marital Status: Married    Spouse Name: N/A  . Number of Children: N/A  . Years of Education: N/A   Occupational History  . Not on file.   Social History Main Topics  . Smoking status: Former Smoker    Types: Cigarettes  . Smokeless tobacco: Never Used  . Alcohol Use: Yes     Comment: rare  . Drug Use: No  . Sexual Activity: Yes     Comment: lives with wife, no dietary restrictions, regular exercise   Other Topics Concern  . Not on file   Social History Narrative    Outpatient Prescriptions Prior to Visit  Medication Sig Dispense Refill  . amphetamine-dextroamphetamine (ADDERALL) 20 MG tablet Take 20 mg by mouth 3 (three) times daily. Reported on 10/28/2015    . Coenzyme Q10 (COQ-10) 400 MG CAPS Take 400 mg by mouth daily. Reported on 10/28/2015    . diphenhydrAMINE (SOMINEX) 25 MG tablet Take 25 mg by mouth 2 (two) times daily as needed. Reported on 10/28/2015    . esomeprazole (NEXIUM) 40 MG capsule TAKE 1 CAPSULE DAILY BEFOREBREAKFAST 90 capsule 1  . Eszopiclone (ESZOPICLONE) 3 MG  TABS Take 3 mg by mouth at bedtime. Reported on 10/28/2015    . Fiber CAPS Take by mouth daily. Reported on 10/28/2015    . HYDROmorphone HCl (EXALGO) 8 MG TB24 Take 8 mg by mouth 2 (two) times daily. Reported on 10/28/2015    . hyoscyamine (LEVSIN SL) 0.125 MG SL tablet Place 1 tablet (0.125 mg total) under the tongue every 4 (four) hours as needed. 30 tablet 1  . Misc Natural Products (COLON CLEANSER PO) Take 500 mg by mouth daily. Reported on 10/28/2015    . Multiple Vitamins-Minerals (OCUVITE PO) Take by mouth daily. Reported on 10/28/2015    . NON FORMULARY Take 500 mg by mouth daily. Reported on 10/28/2015    . omega-3 acid ethyl esters (LOVAZA) 1 G capsule TAKE 2 CAPSULES (=2GRAMS   TOTAL) TWO TIMES A DAY 360 capsule 1  . trazodone (DESYREL) 300 MG tablet Take 150 mg by mouth every evening.  Reported on 10/28/2015    . atorvastatin (LIPITOR) 80 MG tablet Take 1 tablet (80 mg total) by mouth daily. 90 tablet 2  . atorvastatin (LIPITOR) 80 MG tablet TAKE 1 TABLET DAILY 90 tablet 1  . lisinopril (PRINIVIL,ZESTRIL) 10 MG tablet Take 1 tablet (10 mg total) by mouth daily. 90 tablet 0  . tamsulosin (FLOMAX) 0.4 MG CAPS capsule Take 1 capsule (0.4 mg total) by mouth daily. 90 capsule 1  . venlafaxine (EFFEXOR) 75 MG tablet Take 75 mg by mouth 2 (two) times daily. Reported on 10/28/2015    . scopolamine (TRANSDERM-SCOP, 1.5 MG,) 1 MG/3DAYS Place 1 patch (1.5 mg total) onto the skin every 3 (three) days. (Patient not taking: Reported on 10/28/2015) 4 patch 0  . scopolamine (TRANSDERM-SCOP, 1.5 MG,) 1 MG/3DAYS Place 1 patch (1.5 mg total) onto the skin every 3 (three) days. (Patient not taking: Reported on 10/28/2015) 4 patch 0   No facility-administered medications prior to visit.    No Known Allergies  Review of Systems  Constitutional: Negative for fever and malaise/fatigue.  HENT: Negative for congestion.   Eyes: Negative for blurred vision.  Respiratory: Negative for shortness of breath.   Cardiovascular: Negative for chest pain, palpitations and leg swelling.  Gastrointestinal: Negative for nausea, abdominal pain and blood in stool.  Genitourinary: Negative for dysuria and frequency.  Musculoskeletal: Negative for falls.  Skin: Negative for rash.  Neurological: Negative for dizziness, loss of consciousness and headaches.  Endo/Heme/Allergies: Negative for environmental allergies.  Psychiatric/Behavioral: Positive for depression. The patient is not nervous/anxious.        Objective:    Physical Exam  Constitutional: He is oriented to person, place, and time. He appears well-developed and well-nourished. No distress.  HENT:  Head: Normocephalic and atraumatic.  Nose: Nose normal.  Eyes: Right eye exhibits no discharge. Left eye exhibits no discharge.  Neck: Normal range of  motion. Neck supple.  Cardiovascular: Normal rate and regular rhythm.   No murmur heard. Pulmonary/Chest: Effort normal and breath sounds normal.  Abdominal: Soft. Bowel sounds are normal. There is no tenderness.  Musculoskeletal: He exhibits no edema.  Neurological: He is alert and oriented to person, place, and time.  Skin: Skin is warm and dry.  Psychiatric: He has a normal mood and affect.  Nursing note and vitals reviewed.   BP 138/88 mmHg  Pulse 71  Temp(Src) 98 F (36.7 C) (Oral)  Ht 5\' 6"  (1.676 m)  Wt 173 lb 8 oz (78.699 kg)  BMI 28.02 kg/m2  SpO2 97% Wt Readings  from Last 3 Encounters:  10/28/15 173 lb 8 oz (78.699 kg)  01/12/15 176 lb 2 oz (79.89 kg)  07/01/14 186 lb 6.4 oz (84.55 kg)     Lab Results  Component Value Date   WBC 4.9 07/31/2014   HGB 15.1 07/31/2014   HCT 43.7 07/31/2014   PLT 188.0 07/31/2014   GLUCOSE 111* 07/31/2014   CHOL 171 07/31/2014   TRIG 209.0* 07/31/2014   HDL 40.80 07/31/2014   LDLDIRECT 82.0 07/31/2014   LDLCALC 82 09/09/2012   ALT 29 07/31/2014   AST 24 07/31/2014   NA 140 07/31/2014   K 5.0 07/31/2014   CL 104 07/31/2014   CREATININE 0.93 07/31/2014   BUN 23 07/31/2014   CO2 32 07/31/2014   TSH 2.06 07/31/2014   PSA 4.37* 07/31/2014   HGBA1C 5.9* 09/09/2012    Lab Results  Component Value Date   TSH 2.06 07/31/2014   Lab Results  Component Value Date   WBC 4.9 07/31/2014   HGB 15.1 07/31/2014   HCT 43.7 07/31/2014   MCV 90.7 07/31/2014   PLT 188.0 07/31/2014   Lab Results  Component Value Date   NA 140 07/31/2014   K 5.0 07/31/2014   CO2 32 07/31/2014   GLUCOSE 111* 07/31/2014   BUN 23 07/31/2014   CREATININE 0.93 07/31/2014   BILITOT 1.0 07/31/2014   ALKPHOS 89 07/31/2014   AST 24 07/31/2014   ALT 29 07/31/2014   PROT 6.9 07/31/2014   ALBUMIN 4.3 07/31/2014   CALCIUM 9.7 07/31/2014   GFR 87.42 07/31/2014   Lab Results  Component Value Date   CHOL 171 07/31/2014   Lab Results  Component  Value Date   HDL 40.80 07/31/2014   Lab Results  Component Value Date   LDLCALC 82 09/09/2012   Lab Results  Component Value Date   TRIG 209.0* 07/31/2014   Lab Results  Component Value Date   CHOLHDL 4 07/31/2014   Lab Results  Component Value Date   HGBA1C 5.9* 09/09/2012       Assessment & Plan:   Problem List Items Addressed This Visit    GERD (Chronic)    Avoid offending foods, start probiotics. Do not eat large meals in late evening and consider raising head of bed.       Essential hypertension    Well controlled, no changes to meds. Encouraged heart healthy diet such as the DASH diet and exercise as tolerated.       Relevant Medications   lisinopril (PRINIVIL,ZESTRIL) 10 MG tablet   atorvastatin (LIPITOR) 80 MG tablet   BPH (benign prostatic hyperplasia)    [facility]s [facility], he is doing well on Flomax      Relevant Medications   tamsulosin (FLOMAX) 0.4 MG CAPS capsule   Grief reaction    His wife died suddenly in 05-13-2022 of this year and he has had trouble organizing his life and making appointments. He is apologetic about his missed appointments here and would like to be reinstated as a patient. I have agreed to keep him as a patient moving forward. He is maintained on Venlafaxine tid, continue same       Other Visit Diagnoses    HTN (hypertension), benign    -  Primary    Relevant Medications    lisinopril (PRINIVIL,ZESTRIL) 10 MG tablet    atorvastatin (LIPITOR) 80 MG tablet    Other Relevant Orders    TSH    Comprehensive metabolic panel  Lipid panel    CBC    Hyperlipidemia, mixed        Relevant Medications    lisinopril (PRINIVIL,ZESTRIL) 10 MG tablet    atorvastatin (LIPITOR) 80 MG tablet    Other Relevant Orders    TSH    Comprehensive metabolic panel    Lipid panel    CBC       I have discontinued Mr. Lisanti's scopolamine and scopolamine. I have also changed his venlafaxine. Additionally, I am having him maintain his  amphetamine-dextroamphetamine, HYDROmorphone HCl, Eszopiclone, trazodone, NON FORMULARY, Multiple Vitamins-Minerals (OCUVITE PO), CoQ-10, Misc Natural Products (COLON CLEANSER PO), Fiber, diphenhydrAMINE, hyoscyamine, esomeprazole, omega-3 acid ethyl esters, amphetamine-dextroamphetamine, tamsulosin, lisinopril, and atorvastatin.  Meds ordered this encounter  Medications  . amphetamine-dextroamphetamine (ADDERALL XR) 20 MG 24 hr capsule    Sig: Take 1 capsule by mouth daily.  . tamsulosin (FLOMAX) 0.4 MG CAPS capsule    Sig: Take 1 capsule (0.4 mg total) by mouth daily.    Dispense:  90 capsule    Refill:  1  . lisinopril (PRINIVIL,ZESTRIL) 10 MG tablet    Sig: Take 1 tablet (10 mg total) by mouth daily.    Dispense:  90 tablet    Refill:  0  . atorvastatin (LIPITOR) 80 MG tablet    Sig: Take 1 tablet (80 mg total) by mouth daily.    Dispense:  90 tablet    Refill:  2  . venlafaxine (EFFEXOR) 75 MG tablet    Sig: Take 1 tablet (75 mg total) by mouth 3 (three) times daily with meals. Reported on 10/28/2015    Dispense:  90 tablet    Refill:  5     Penni Homans, MD

## 2015-10-28 NOTE — Telephone Encounter (Signed)
The dismissal will be lifted and reinstate immediately. DAJ

## 2015-10-28 NOTE — Telephone Encounter (Signed)
Patient was in today to see me and unfortunately his wife died unexpectedly and he has been struggling to keep his appointments, pay his bills etc. In light of this I am willing to reinstate him as a patient and help him with this. What has to happen to do this?

## 2015-11-07 ENCOUNTER — Encounter: Payer: Self-pay | Admitting: Family Medicine

## 2015-11-07 DIAGNOSIS — F4321 Adjustment disorder with depressed mood: Secondary | ICD-10-CM

## 2015-11-07 DIAGNOSIS — F4320 Adjustment disorder, unspecified: Secondary | ICD-10-CM

## 2015-11-07 HISTORY — DX: Adjustment disorder, unspecified: F43.20

## 2015-11-07 HISTORY — DX: Adjustment disorder with depressed mood: F43.21

## 2015-11-07 NOTE — Assessment & Plan Note (Addendum)
Follows with [facility], he is doing well on Flomax

## 2015-11-07 NOTE — Assessment & Plan Note (Signed)
Well controlled, no changes to meds. Encouraged heart healthy diet such as the DASH diet and exercise as tolerated.  °

## 2015-11-07 NOTE — Assessment & Plan Note (Signed)
His wife died suddenly in January of this year and he has had trouble organizing his life and making appointments. He is apologetic about his missed appointments here and would like to be reinstated as a patient. I have agreed to keep him as a patient moving forward. He is maintained on Venlafaxine tid, continue same

## 2015-11-07 NOTE — Assessment & Plan Note (Signed)
Avoid offending foods, start probiotics. Do not eat large meals in late evening and consider raising head of bed.  

## 2015-11-10 ENCOUNTER — Other Ambulatory Visit: Payer: Self-pay | Admitting: Family Medicine

## 2015-12-30 ENCOUNTER — Ambulatory Visit: Payer: Federal, State, Local not specified - PPO | Admitting: Family Medicine

## 2016-01-05 ENCOUNTER — Other Ambulatory Visit: Payer: Self-pay | Admitting: Family Medicine

## 2016-01-05 MED ORDER — LISINOPRIL 10 MG PO TABS: 10.0000 mg | ORAL_TABLET | Freq: Every day | ORAL | 0 refills | Status: DC

## 2016-01-05 NOTE — Telephone Encounter (Signed)
Medication filled to pharmacy as requested. Called pt and notified him as requested.

## 2016-01-05 NOTE — Telephone Encounter (Signed)
°  Relationship to patient: Self  Can be reached: [phone]   Pharmacy:  [id number] - [place], [place] [phone] (Phone) [phone] (Fax)   Reason for call: Request for refill HYDROmorphone HCl (EXALGO) 8 MG TB24 [id number]

## 2016-01-05 NOTE — Telephone Encounter (Signed)
Pt request refill for lisinopril.     Pharmacy: [id number] - [place], [place]   Please advise pt once Rx is sent     Thanks.

## 2016-01-18 ENCOUNTER — Encounter: Payer: Federal, State, Local not specified - PPO | Admitting: Family Medicine

## 2016-05-04 ENCOUNTER — Other Ambulatory Visit: Payer: Self-pay | Admitting: Family Medicine

## 2016-05-09 ENCOUNTER — Encounter: Payer: Federal, State, Local not specified - PPO | Admitting: Family Medicine

## 2016-05-25 ENCOUNTER — Encounter: Payer: Self-pay | Admitting: Medical

## 2016-05-25 ENCOUNTER — Ambulatory Visit (INDEPENDENT_AMBULATORY_CARE_PROVIDER_SITE_OTHER): Payer: Federal, State, Local not specified - PPO | Admitting: Medical

## 2016-05-25 VITALS — BP 145/91 | HR 83 | Temp 98.8°F | Ht 66.0 in | Wt 170.2 lb

## 2016-05-25 DIAGNOSIS — J40 Bronchitis, not specified as acute or chronic: Secondary | ICD-10-CM | POA: Diagnosis not present

## 2016-05-25 DIAGNOSIS — R059 Cough, unspecified: Secondary | ICD-10-CM

## 2016-05-25 DIAGNOSIS — R05 Cough: Secondary | ICD-10-CM

## 2016-05-25 MED ORDER — AZITHROMYCIN 250 MG PO TABS: ORAL_TABLET | ORAL | 0 refills | Status: DC

## 2016-05-25 MED ORDER — BENZONATATE 100 MG PO CAPS
100.0000 mg | ORAL_CAPSULE | Freq: Three times a day (TID) | ORAL | 0 refills | Status: DC | PRN
Start: 2016-05-25 — End: 2017-02-12

## 2016-05-25 MED ORDER — FLUTICASONE PROPIONATE 50 MCG/ACT NA SUSP: 2.0000 | Freq: Every day | NASAL | 1 refills | Status: DC

## 2016-05-25 NOTE — Patient Instructions (Signed)
You appear to have bronchitis. Rest hydrate and tylenol for fever. I am prescribing cough medicine benzonatate, and azithromycin antibiotic. For your nasal congestion rx flonase   You should gradually get better. If not then notify us and would recommend a chest xray.  Follow up in 7-10 days or as needed 

## 2016-05-25 NOTE — Progress Notes (Signed)
Pre visit review using our clinic tool,if applicable. No additional management support is needed unless otherwise documented below in the visit note.  

## 2016-05-25 NOTE — Progress Notes (Signed)
Subjective:    Patient ID: Gregory Carr, male    DOB: 09/10/1950, 66 y.o.   MRN: [id number]  HPI  Pt in for recent cough for about 3 days. He states productive cough. Feels some fever. No chills or sweat. States feeling tired. No diffuse body aches.   Hx of occasional bronchitis about every 2 years.   Pt has concern that he will get worse as we approach the weekend.     Review of Systems  Constitutional: Negative for chills, fatigue and fever.  Respiratory: Positive for cough. Negative for chest tightness, shortness of breath and wheezing.        Thick yellow brown mucous.  Cardiovascular: Negative for chest pain and palpitations.  Gastrointestinal: Negative for abdominal distention, abdominal pain and anal bleeding.  Musculoskeletal: Negative for back pain and neck stiffness.  Skin: Negative for rash.  Neurological: Negative for dizziness, tremors, syncope, speech difficulty, weakness and headaches.  Hematological: Negative for adenopathy. Does not bruise/bleed easily.  Psychiatric/Behavioral: Negative for agitation.   Past Medical History:  Diagnosis Date  . Abdominal pain, left lower quadrant 01/17/2015  . BACK PAIN, CHRONIC 11/11/2007   Qualifier: Diagnosis of  By: Redmond Pulling MD, [place]    . BARRETTS ESOPHAGUS 11/11/2007   Qualifier: Diagnosis of  By: Redmond Pulling MD, [place]ann R[place]    . BPH (benign prostatic hyperplasia) 09/13/2012  . BPH (benign prostatic hyperplasia) 09/13/2012   Follows with [facility]   . ERECTILE DYSFUNCTION 08/16/2006   Qualifier: Diagnosis of  By: Cletus Gash MD, [place]    . GERD 08/16/2006   Qualifier: Diagnosis of  By: Cletus Gash MD, [place]    . [place] SYNDROME 01/22/2008   Qualifier: Diagnosis of  By: Redmond Pulling MD, [place]ann R[place]    . GLUCOSE INTOLERANCE 12/11/2007   Qualifier: Diagnosis of  By: Redmond Pulling MD, [place]     . Grief reaction 11/07/2015  . HTN (hypertension) 03/24/2011  . HYPERLIPIDEMIA 08/16/2006   Qualifier: Diagnosis of  By: Cletus Gash MD, [place]    .  Onychomycosis 09/13/2012  . Other anxiety states 04/08/2007   Qualifier: Diagnosis of  By: Cletus Gash MD, [place]    . Preventative health care 09/29/2013  . Renal cell carcinoma ([place]) 12/15/2011   S/p left nephrectomy Follows with [facility]      Social History   Social History  . Marital status: Married    Spouse name: N/A  . Number of children: N/A  . Years of education: N/A   Occupational History  . Not on file.   Social History Main Topics  . Smoking status: Former Smoker    Types: Cigarettes  . Smokeless tobacco: Never Used  . Alcohol use Yes     Comment: rare  . Drug use: No  . Sexual activity: Yes     Comment: lives with wife, no dietary restrictions, regular exercise   Other Topics Concern  . Not on file   Social History Narrative  . No narrative on file    No past surgical history on file.  Family History  Problem Relation Age of Onset  . Dementia Mother   . Cancer Daughter     brain cancer  . Hypertension Maternal Grandfather   . Heart disease Maternal Grandfather     MI  . Stroke Paternal Grandfather   . Arthritis Sister     No Known Allergies  Current Outpatient Prescriptions on File Prior to Visit  Medication Sig Dispense Refill  . amphetamine-dextroamphetamine (ADDERALL XR) 20 MG 24 hr capsule Take  1 capsule by mouth daily.    Marland Kitchen amphetamine-dextroamphetamine (ADDERALL) 20 MG tablet Take 20 mg by mouth 3 (three) times daily. Reported on 10/28/2015    . atorvastatin (LIPITOR) 80 MG tablet Take 1 tablet (80 mg total) by mouth daily. 90 tablet 2  . Coenzyme Q10 (COQ-10) 400 MG CAPS Take 400 mg by mouth daily. Reported on 10/28/2015    . diphenhydrAMINE (SOMINEX) 25 MG tablet Take 25 mg by mouth 2 (two) times daily as needed. Reported on 10/28/2015    . esomeprazole (NEXIUM) 40 MG capsule TAKE 1 CAPSULE DAILY BEFOREBREAKFAST 90 capsule 1  . Eszopiclone (ESZOPICLONE) 3 MG TABS Take 3 mg by mouth at bedtime. Reported on 10/28/2015    . Fiber CAPS Take by  mouth daily. Reported on 10/28/2015    . HYDROmorphone HCl (EXALGO) 8 MG TB24 Take 8 mg by mouth 2 (two) times daily. Reported on 10/28/2015    . hyoscyamine (LEVSIN SL) 0.125 MG SL tablet Place 1 tablet (0.125 mg total) under the tongue every 4 (four) hours as needed. 30 tablet 1  . lisinopril (PRINIVIL,ZESTRIL) 10 MG tablet Take 1 tablet (10 mg total) by mouth daily. 90 tablet 0  . Misc Natural Products (COLON CLEANSER PO) Take 500 mg by mouth daily. Reported on 10/28/2015    . Multiple Vitamins-Minerals (OCUVITE PO) Take by mouth daily. Reported on 10/28/2015    . NON FORMULARY Take 500 mg by mouth daily. Reported on 10/28/2015    . omega-3 acid ethyl esters (LOVAZA) 1 G capsule TAKE 2 CAPSULES (=2GRAMS   TOTAL) TWO TIMES A DAY 360 capsule 1  . tamsulosin (FLOMAX) 0.4 MG CAPS capsule TAKE ONE CAPSULE BY MOUTH EVERY DAY 90 capsule 1  . trazodone (DESYREL) 300 MG tablet Take 150 mg by mouth every evening. Reported on 10/28/2015    . venlafaxine (EFFEXOR) 75 MG tablet Take 1 tablet (75 mg total) by mouth 3 (three) times daily with meals. Reported on 10/28/2015 90 tablet 5   No current facility-administered medications on file prior to visit.     BP (!) 145/91   Pulse 83   Temp 98.8 F (37.1 C) (Oral)   Ht 5\' 6"  (1.676 m)   Wt 170 lb 3.2 oz (77.2 kg)   SpO2 99%   BMI 27.47 kg/m       Objective:   Physical Exam  General  Mental Status - Alert. General Appearance - Well groomed. Not in acute distress.  Skin Rashes- No Rashes.  HEENT Head- Normal. Ear Auditory Canal - Left- Normal. Right - Normal.Tympanic Membrane- Left- Normal. Right- Normal. Eye Sclera/Conjunctiva- Left- Normal. Right- Normal. Nose & Sinuses Nasal Mucosa- Left-  Boggy and Congested. Right-  Boggy and  Congested.Bilateral maxillary and frontal sinus pressure. Mouth & Throat Lips: Upper Lip- Normal: no dryness, cracking, pallor, cyanosis, or vesicular eruption. Lower Lip-Normal: no dryness, cracking, pallor, cyanosis or  vesicular eruption. Buccal Mucosa- Bilateral- No Aphthous ulcers. Oropharynx- No Discharge or Erythema. Tonsils: Characteristics- Bilateral- No Erythema or Congestion. Size/Enlargement- Bilateral- No enlargement. Discharge- bilateral-None.  Neck Neck- Supple. No Masses.   Chest and Lung Exam Auscultation: Breath Sounds:-Clear even and unlabored.  Cardiovascular Auscultation:Rythm- Regular, rate and rhythm. Murmurs & Other Heart Sounds:Ausculatation of the heart reveal- No Murmurs.  Lymphatic Head & Neck General Head & Neck Lymphatics: Bilateral: Description- No Localized lymphadenopathy.       Assessment & Plan:  You appear to have bronchitis. Rest hydrate and tylenol for fever. I am prescribing cough  medicine benzonatate, and azithromycin antibiotic. For your nasal congestion rx flonase.  You should gradually get better. If not then notify us and would recommend a chest xray.  Follow up in 7-10 days or as needed  Zeshan Sena, Percell Miller, [employer]

## 2016-07-11 ENCOUNTER — Other Ambulatory Visit: Payer: Self-pay | Admitting: Family Medicine

## 2016-07-17 ENCOUNTER — Ambulatory Visit (INDEPENDENT_AMBULATORY_CARE_PROVIDER_SITE_OTHER): Payer: Federal, State, Local not specified - PPO | Admitting: Family Medicine

## 2016-07-17 ENCOUNTER — Encounter: Payer: Self-pay | Admitting: Family Medicine

## 2016-07-17 VITALS — BP 141/87 | HR 82 | Temp 97.7°F | Ht 66.0 in | Wt 171.2 lb

## 2016-07-17 DIAGNOSIS — Z0000 Encounter for general adult medical examination without abnormal findings: Secondary | ICD-10-CM

## 2016-07-17 DIAGNOSIS — E739 Lactose intolerance, unspecified: Secondary | ICD-10-CM

## 2016-07-17 DIAGNOSIS — C642 Malignant neoplasm of left kidney, except renal pelvis: Secondary | ICD-10-CM | POA: Diagnosis not present

## 2016-07-17 DIAGNOSIS — E782 Mixed hyperlipidemia: Secondary | ICD-10-CM | POA: Diagnosis not present

## 2016-07-17 DIAGNOSIS — F528 Other sexual dysfunction not due to a substance or known physiological condition: Secondary | ICD-10-CM

## 2016-07-17 DIAGNOSIS — I10 Essential (primary) hypertension: Secondary | ICD-10-CM

## 2016-07-17 DIAGNOSIS — M25551 Pain in right hip: Secondary | ICD-10-CM | POA: Diagnosis not present

## 2016-07-17 DIAGNOSIS — N400 Enlarged prostate without lower urinary tract symptoms: Secondary | ICD-10-CM

## 2016-07-17 NOTE — Assessment & Plan Note (Signed)
Well controlled, no changes to meds. Encouraged heart healthy diet such as the DASH diet and exercise as tolerated.  °

## 2016-07-17 NOTE — Patient Instructions (Addendum)
Apply Solanpas, Apspercreme, Icy Hot (with Lidocaine) to the affected area as needed. Spoke with you today in regards to Gregory Carr. Encouraged to you bring a notarized copy of your Living Will and Healthcare Power of Attorney into Dr. Frederik Pear Office to be scanned into your Chart. Soak your feet nightly for 10-15 minutes with hot water and distilled white vinegar. Then apply  Vick's Vapor Rub or Lamasil to the feet and toenail bed (alternating between the two medications). Preventive Care 40-64 Years, Male Preventive care refers to lifestyle choices and visits with your health care provider that can promote health and wellness. What does preventive care include?  A yearly physical exam. This is also called an annual well check.  Dental exams once or twice a year.  Routine eye exams. Ask your health care provider how often you should have your eyes checked.  Personal lifestyle choices, including:  Daily care of your teeth and gums.  Regular physical activity.  Eating a healthy diet.  Avoiding tobacco and drug use.  Limiting alcohol use.  Practicing safe sex.  Taking low-dose aspirin every day starting at age 35. What happens during an annual well check? The services and screenings done by your health care provider during your annual well check will depend on your age, overall health, lifestyle risk factors, and family history of disease. Counseling  Your health care provider may ask you questions about your:  Alcohol use.  Tobacco use.  Drug use.  Emotional well-being.  Home and relationship well-being.  Sexual activity.  Eating habits.  Work and work Statistician. Screening  You may have the following tests or measurements:  Height, weight, and BMI.  Blood pressure.  Lipid and cholesterol levels. These may be checked every 5 years, or more frequently if you are over 40 years old.  Skin check.  Lung cancer screening. You may have this screening every  year starting at age 68 if you have a 30-pack-year history of smoking and currently smoke or have quit within the past 15 years.  Fecal occult blood test (FOBT) of the stool. You may have this test every year starting at age 56.  Flexible sigmoidoscopy or colonoscopy. You may have a sigmoidoscopy every 5 years or a colonoscopy every 10 years starting at age 16.  Prostate cancer screening. Recommendations will vary depending on your family history and other risks.  Hepatitis C blood test.  Hepatitis B blood test.  Sexually transmitted disease (STD) testing.  Diabetes screening. This is done by checking your blood sugar (glucose) after you have not eaten for a while (fasting). You may have this done every 1-3 years. Discuss your test results, treatment options, and if necessary, the need for more tests with your health care provider. Vaccines  Your health care provider may recommend certain vaccines, such as:  Influenza vaccine. This is recommended every year.  Tetanus, diphtheria, and acellular pertussis (Tdap, Td) vaccine. You may need a Td booster every 10 years.  Varicella vaccine. You may need this if you have not been vaccinated.  Zoster vaccine. You may need this after age 64.  Measles, mumps, and rubella (MMR) vaccine. You may need at least one dose of MMR if you were born in 1957 or later. You may also need a second dose.  Pneumococcal 13-valent conjugate (PCV13) vaccine. You may need this if you have certain conditions and have not been vaccinated.  Pneumococcal polysaccharide (PPSV23) vaccine. You may need one or two doses if you smoke cigarettes or  if you have certain conditions.  Meningococcal vaccine. You may need this if you have certain conditions.  Hepatitis A vaccine. You may need this if you have certain conditions or if you travel or work in places where you may be exposed to hepatitis A.  Hepatitis B vaccine. You may need this if you have certain conditions or  if you travel or work in places where you may be exposed to hepatitis B.  Haemophilus influenzae type b (Hib) vaccine. You may need this if you have certain risk factors. Talk to your health care provider about which screenings and vaccines you need and how often you need them. This information is not intended to replace advice given to you by your health care provider. Make sure you discuss any questions you have with your health care provider. Document Released: 05/07/2015 Document Revised: 12/29/2015 Document Reviewed: 02/09/2015 Elsevier Interactive Patient Education  2017 [employer].

## 2016-07-17 NOTE — Assessment & Plan Note (Signed)
-

## 2016-07-17 NOTE — Assessment & Plan Note (Signed)
Check psa 

## 2016-07-17 NOTE — Assessment & Plan Note (Signed)
minimize simple carbs. Increase exercise as tolerated.  

## 2016-07-17 NOTE — Progress Notes (Signed)
Patient ID: RESEAN BRANDER, male   DOB: 1951/03/02, 66 y.o.   MRN: [id number]   Subjective:  I acted as a Education administrator for Penni Homans, MD. Raiford [place], [place]   Patient ID: JAMMIE CLINK, male    DOB: 1950-05-06, 66 y.o.   MRN: [id number]  Chief Complaint  Patient presents with  . Annual Exam  . Hypertension    Hypertension  This is a chronic problem. The problem is controlled. Pertinent negatives include no blurred vision, chest pain, headaches, malaise/fatigue, palpitations or shortness of breath. Risk factors for coronary artery disease include male gender and diabetes mellitus.    Patient is in today for an annual examination. Patient has a Hx of GERD, HTN, insomnia, erectile dysfunction. Patient has no acute concerns noted at this time. He does note some intermittent right hip pain that radiates down right leg at times. No falls or trauma. Is trying to maintain a heart healthy diet. Denies CP/palp/SOB/HA/congestion/fevers/GI or GU c/o. Taking meds as prescribed  Patient Care Team: Mosie Lukes, MD as PCP - General (Family Medicine) Franchot Gallo, MD as Consulting Physician (Urology)   Past Medical History:  Diagnosis Date  . Abdominal pain, left lower quadrant 01/17/2015  . BACK PAIN, CHRONIC 11/11/2007   Qualifier: Diagnosis of  By: Redmond Pulling MD, Frann Rider    . BARRETTS ESOPHAGUS 11/11/2007   Qualifier: Diagnosis of  By: Redmond Pulling MD, Frann Rider    . BPH (benign prostatic hyperplasia) 09/13/2012  . BPH (benign prostatic hyperplasia) 09/13/2012   Follows with [facility]   . ERECTILE DYSFUNCTION 08/16/2006   Qualifier: Diagnosis of  By: Cletus Gash MD, [place]    . GERD 08/16/2006   Qualifier: Diagnosis of  By: Cletus Gash MD, [place]    . [place] SYNDROME 01/22/2008   Qualifier: Diagnosis of  By: Redmond Pulling MD, Frann Rider    . GLUCOSE INTOLERANCE 12/11/2007   Qualifier: Diagnosis of  By: Redmond Pulling MD, Frann Rider     . Grief reaction 11/07/2015  . HTN (hypertension) 03/24/2011  . HYPERLIPIDEMIA  08/16/2006   Qualifier: Diagnosis of  By: Cletus Gash MD, [place]    . Onychomycosis 09/13/2012  . Other anxiety states 04/08/2007   Qualifier: Diagnosis of  By: Cletus Gash MD, [place]    . Preventative health care 09/29/2013  . Renal cell carcinoma ([place]) 12/15/2011   S/p left nephrectomy Follows with [facility]   . Right hip pain 07/21/2016    No past surgical history on file.  Family History  Problem Relation Age of Onset  . Dementia Mother   . Cancer Daughter     brain cancer, glioblastoma  . Hypertension Maternal Grandfather   . Heart disease Maternal Grandfather     MI  . Stroke Paternal Grandfather   . Arthritis Sister     Social History   Social History  . Marital status: Married    Spouse name: N/A  . Number of children: N/A  . Years of education: N/A   Occupational History  . Not on file.   Social History Main Topics  . Smoking status: Former Smoker    Types: Cigarettes  . Smokeless tobacco: Never Used  . Alcohol use Yes     Comment: rare  . Drug use: No  . Sexual activity: Yes     Comment: lives with wife, no dietary restrictions, regular exercise   Other Topics Concern  . Not on file   Social History Narrative  . No narrative on file    Outpatient Medications Prior to Visit  Medication Sig Dispense Refill  . amphetamine-dextroamphetamine (ADDERALL XR) 20 MG 24 hr capsule Take 1 capsule by mouth daily.    Marland Kitchen amphetamine-dextroamphetamine (ADDERALL) 20 MG tablet Take 20 mg by mouth 3 (three) times daily. Reported on 10/28/2015    . azithromycin (ZITHROMAX) 250 MG tablet Take 2 tablets by mouth on day 1, followed by 1 tablet by mouth daily for 4 days. 6 tablet 0  . benzonatate (TESSALON) 100 MG capsule Take 1 capsule (100 mg total) by mouth 3 (three) times daily as needed for cough. 21 capsule 0  . Coenzyme Q10 (COQ-10) 400 MG CAPS Take 400 mg by mouth daily. Reported on 10/28/2015    . diphenhydrAMINE (SOMINEX) 25 MG tablet Take 25 mg by mouth 2 (two) times  daily as needed. Reported on 10/28/2015    . esomeprazole (NEXIUM) 40 MG capsule TAKE 1 CAPSULE DAILY BEFOREBREAKFAST 90 capsule 1  . Eszopiclone (ESZOPICLONE) 3 MG TABS Take 3 mg by mouth at bedtime. Reported on 10/28/2015    . Fiber CAPS Take by mouth daily. Reported on 10/28/2015    . fluticasone (FLONASE) 50 MCG/ACT nasal spray Place 2 sprays into both nostrils daily. 16 g 1  . HYDROmorphone HCl (EXALGO) 8 MG TB24 Take 8 mg by mouth 2 (two) times daily. Reported on 10/28/2015    . hyoscyamine (LEVSIN SL) 0.125 MG SL tablet Place 1 tablet (0.125 mg total) under the tongue every 4 (four) hours as needed. 30 tablet 1  . lisinopril (PRINIVIL,ZESTRIL) 10 MG tablet TAKE 1 TABLET (10 MG TOTAL) BY MOUTH DAILY. 90 tablet 0  . Misc Natural Products (COLON CLEANSER PO) Take 500 mg by mouth daily. Reported on 10/28/2015    . Multiple Vitamins-Minerals (OCUVITE PO) Take by mouth daily. Reported on 10/28/2015    . NON FORMULARY Take 500 mg by mouth daily. Reported on 10/28/2015    . omega-3 acid ethyl esters (LOVAZA) 1 G capsule TAKE 2 CAPSULES (=2GRAMS   TOTAL) TWO TIMES A DAY 360 capsule 1  . tamsulosin (FLOMAX) 0.4 MG CAPS capsule TAKE ONE CAPSULE BY MOUTH EVERY DAY 90 capsule 1  . trazodone (DESYREL) 300 MG tablet Take 150 mg by mouth every evening. Reported on 10/28/2015    . venlafaxine (EFFEXOR) 75 MG tablet Take 1 tablet (75 mg total) by mouth 3 (three) times daily with meals. Reported on 10/28/2015 90 tablet 5  . atorvastatin (LIPITOR) 80 MG tablet Take 1 tablet (80 mg total) by mouth daily. 90 tablet 2   No facility-administered medications prior to visit.     No Known Allergies  Review of Systems  Constitutional: Negative for fever and malaise/fatigue.  HENT: Negative for congestion.   Eyes: Negative for blurred vision.  Respiratory: Negative for cough and shortness of breath.   Cardiovascular: Negative for chest pain, palpitations and leg swelling.  Gastrointestinal: Negative for vomiting.    Musculoskeletal: Positive for joint pain. Negative for back pain.  Skin: Negative for rash.  Neurological: Negative for loss of consciousness and headaches.       Objective:    Physical Exam  Constitutional: He is oriented to person, place, and time. He appears well-developed and well-nourished. No distress.  HENT:  Head: Normocephalic and atraumatic.  Eyes: Conjunctivae are normal.  Neck: Normal range of motion. No thyromegaly present.  Cardiovascular: Normal rate and regular rhythm.   Pulmonary/Chest: Effort normal and breath sounds normal. He has no wheezes.  Abdominal: Soft. Bowel sounds are normal. He exhibits no distension and  no mass. There is no tenderness. There is no rebound and no guarding.  Musculoskeletal: He exhibits no edema or deformity.  Neurological: He is alert and oriented to person, place, and time.  Skin: Skin is warm and dry. He is not diaphoretic.  Psychiatric: He has a normal mood and affect.    BP (!) 141/87 (BP Location: Left Arm, Patient Position: Sitting, Cuff Size: Normal)   Pulse 82   Temp 97.7 F (36.5 C) (Oral)   Ht 5\' 6"  (1.676 m)   Wt 171 lb 3.2 oz (77.7 kg)   SpO2 98% Comment: RA  BMI 27.63 kg/m  Wt Readings from Last 3 Encounters:  07/17/16 171 lb 3.2 oz (77.7 kg)  05/25/16 170 lb 3.2 oz (77.2 kg)  10/28/15 173 lb 8 oz (78.7 kg)   BP Readings from Last 3 Encounters:  07/17/16 (!) 141/87  05/25/16 (!) 145/91  10/28/15 138/88     Immunization History  Administered Date(s) Administered  . H1N1 05/18/2008  . Influenza Whole 04/02/2007, 01/20/2008, 01/22/2009, 03/08/2010  . Influenza,inj,Quad PF,36+ Mos 01/26/2014, 01/12/2015  . Pneumococcal Conjugate-13 04/08/2013  . Td 12/10/2006    Health Maintenance  Topic Date Due  . Hepatitis C Screening  November 15, 1950  . HIV Screening  04/22/1967  . INFLUENZA VACCINE  11/23/2015  . TETANUS/TDAP  12/09/2016  . COLONOSCOPY  04/25/2019    Lab Results  Component Value Date   WBC 7.1  07/17/2016   HGB 14.5 07/17/2016   HCT 42.5 07/17/2016   PLT 245.0 07/17/2016   GLUCOSE 103 (H) 07/17/2016   CHOL 145 07/17/2016   TRIG 117.0 07/17/2016   HDL 53.60 07/17/2016   LDLDIRECT 82.0 07/31/2014   LDLCALC 68 07/17/2016   ALT 31 07/17/2016   AST 25 07/17/2016   NA 140 07/17/2016   K 4.1 07/17/2016   CL 104 07/17/2016   CREATININE 0.84 07/17/2016   BUN 24 (H) 07/17/2016   CO2 31 07/17/2016   TSH 1.55 07/17/2016   PSA 4.37 (H) 07/31/2014   HGBA1C 6.0 07/17/2016    Lab Results  Component Value Date   TSH 1.55 07/17/2016   Lab Results  Component Value Date   WBC 7.1 07/17/2016   HGB 14.5 07/17/2016   HCT 42.5 07/17/2016   MCV 91.7 07/17/2016   PLT 245.0 07/17/2016   Lab Results  Component Value Date   NA 140 07/17/2016   K 4.1 07/17/2016   CO2 31 07/17/2016   GLUCOSE 103 (H) 07/17/2016   BUN 24 (H) 07/17/2016   CREATININE 0.84 07/17/2016   BILITOT 0.9 07/17/2016   ALKPHOS 68 07/17/2016   AST 25 07/17/2016   ALT 31 07/17/2016   PROT 6.8 07/17/2016   ALBUMIN 4.3 07/17/2016   CALCIUM 9.1 07/17/2016   GFR 97.70 07/17/2016   Lab Results  Component Value Date   CHOL 145 07/17/2016   Lab Results  Component Value Date   HDL 53.60 07/17/2016   Lab Results  Component Value Date   LDLCALC 68 07/17/2016   Lab Results  Component Value Date   TRIG 117.0 07/17/2016   Lab Results  Component Value Date   CHOLHDL 3 07/17/2016   Lab Results  Component Value Date   HGBA1C 6.0 07/17/2016         Assessment & Plan:   Problem List Items Addressed This Visit    GLUCOSE INTOLERANCE    minimize simple carbs. Increase exercise as tolerated.       Relevant Orders  Hemoglobin A1c (Completed)   Essential hypertension    Well controlled, no changes to meds. Encouraged heart healthy diet such as the DASH diet and exercise as tolerated.       Relevant Orders   CBC (Completed)   Comprehensive metabolic panel (Completed)   TSH (Completed)   ERECTILE  DYSFUNCTION    Check testosterone today      Relevant Orders   Testosterone (Completed)   Renal cell carcinoma ([place])    Doing well renal function maintained.       BPH (benign prostatic hyperplasia)    Check psa      Preventative health care - Primary    Patient encouraged to maintain heart healthy diet, regular exercise, adequate sleep. Consider daily probiotics. Take medications as prescribed. Labs reviewed      Relevant Orders   CBC (Completed)   Hemoglobin A1c (Completed)   Comprehensive metabolic panel (Completed)   Lipid panel (Completed)   TSH (Completed)   Testosterone (Completed)   Hyperlipidemia    Encouraged heart healthy diet, increase exercise, avoid trans fats, consider a krill oil cap daily      Relevant Orders   Lipid panel (Completed)   Right hip pain    Encouraged moist heat and gentle stretching as tolerated. May try NSAIDs and prescription meds as directed and report if symptoms worsen or seek immediate care. Try topical treatments         I am having Mr. Pember maintain his amphetamine-dextroamphetamine, HYDROmorphone HCl, Eszopiclone, trazodone, NON FORMULARY, Multiple Vitamins-Minerals (OCUVITE PO), CoQ-10, Misc Natural Products (COLON CLEANSER PO), Fiber, diphenhydrAMINE, hyoscyamine, omega-3 acid ethyl esters, amphetamine-dextroamphetamine, venlafaxine, esomeprazole, tamsulosin, azithromycin, benzonatate, fluticasone, and lisinopril.  No orders of the defined types were placed in this encounter.   CMA served as Education administrator during this visit. History, Physical and Plan performed by medical provider. Documentation and orders reviewed and attested to.  Penni Homans, MD

## 2016-07-17 NOTE — Assessment & Plan Note (Signed)
Encouraged heart healthy diet, increase exercise, avoid trans fats, consider a krill oil cap daily 

## 2016-07-17 NOTE — Progress Notes (Signed)
Pre visit review using our clinic review tool, if applicable. No additional management support is needed unless otherwise documented below in the visit note. 

## 2016-07-18 LAB — COMPREHENSIVE METABOLIC PANEL
ALBUMIN: 4.3 g/dL (ref 3.5–5.2)
ALT: 31 U/L (ref 0–53)
AST: 25 U/L (ref 0–37)
Alkaline Phosphatase: 68 U/L (ref 39–117)
BUN: 24 mg/dL — ABNORMAL HIGH (ref 6–23)
CALCIUM: 9.1 mg/dL (ref 8.4–10.5)
CHLORIDE: 104 meq/L (ref 96–112)
CO2: 31 meq/L (ref 19–32)
CREATININE: 0.84 mg/dL (ref 0.40–1.50)
GFR: 97.7 mL/min (ref >60.00)
Glucose, Bld: 103 mg/dL — ABNORMAL HIGH (ref 70–99)
POTASSIUM: 4.1 meq/L (ref 3.5–5.1)
Sodium: 140 mEq/L (ref 135–145)
Total Bilirubin: 0.9 mg/dL (ref 0.2–1.2)
Total Protein: 6.8 g/dL (ref 6.0–8.3)

## 2016-07-18 LAB — CBC
HCT: 42.5 % (ref 39.0–52.0)
HEMOGLOBIN: 14.5 g/dL (ref 13.0–17.0)
MCHC: 34.1 g/dL (ref 30.0–36.0)
MCV: 91.7 fl (ref 78.0–100.0)
PLATELETS: 245 10*3/uL (ref 150.0–400.0)
RBC: 4.64 Mil/uL (ref 4.22–5.81)
RDW: 13.9 % (ref 11.5–15.5)
WBC: 7.1 10*3/uL (ref 4.0–10.5)

## 2016-07-18 LAB — LIPID PANEL
CHOL/HDL RATIO: 3
Cholesterol: 145 mg/dL (ref 0–200)
HDL: 53.6 mg/dL (ref >39.00)
LDL Cholesterol: 68 mg/dL (ref 0–99)
NonHDL: 91.35
TRIGLYCERIDES: 117 mg/dL (ref 0.0–149.0)
VLDL: 23.4 mg/dL (ref 0.0–40.0)

## 2016-07-18 LAB — HEMOGLOBIN A1C: Hgb A1c MFr Bld: 6 % (ref 4.6–6.5)

## 2016-07-18 LAB — TESTOSTERONE: Testosterone: 287.71 ng/dL — ABNORMAL LOW (ref 300.00–890.00)

## 2016-07-18 LAB — TSH: TSH: 1.55 u[IU]/mL (ref 0.35–4.50)

## 2016-07-20 ENCOUNTER — Other Ambulatory Visit: Payer: Self-pay | Admitting: Family Medicine

## 2016-07-20 MED ORDER — ATORVASTATIN CALCIUM 80 MG PO TABS
80.0000 mg | ORAL_TABLET | Freq: Every day | ORAL | 3 refills | Status: DC
Start: 2016-07-20 — End: 2017-03-26

## 2016-07-21 ENCOUNTER — Encounter: Payer: Self-pay | Admitting: Family Medicine

## 2016-07-21 DIAGNOSIS — M25551 Pain in right hip: Secondary | ICD-10-CM

## 2016-07-21 HISTORY — DX: Pain in right hip: M25.551

## 2016-07-21 NOTE — Assessment & Plan Note (Signed)
Patient encouraged to maintain heart healthy diet, regular exercise, adequate sleep. Consider daily probiotics. Take medications as prescribed. Labs reviewed 

## 2016-07-21 NOTE — Assessment & Plan Note (Signed)
Encouraged moist heat and gentle stretching as tolerated. May try NSAIDs and prescription meds as directed and report if symptoms worsen or seek immediate care. Try topical treatments 

## 2016-07-21 NOTE — Assessment & Plan Note (Signed)
Doing well renal function maintained.

## 2016-10-06 ENCOUNTER — Other Ambulatory Visit: Payer: Self-pay | Admitting: Family Medicine

## 2016-10-06 MED ORDER — VENLAFAXINE HCL 75 MG PO TABS: 75.0000 mg | ORAL_TABLET | Freq: Three times a day (TID) | ORAL | 0 refills | Status: DC

## 2016-10-08 ENCOUNTER — Other Ambulatory Visit: Payer: Self-pay | Admitting: Family Medicine

## 2016-10-11 ENCOUNTER — Other Ambulatory Visit: Payer: Self-pay | Admitting: Family Medicine

## 2016-10-11 MED ORDER — ESOMEPRAZOLE MAGNESIUM 40 MG PO CPDR: DELAYED_RELEASE_CAPSULE | ORAL | 1 refills | Status: DC

## 2016-11-01 ENCOUNTER — Other Ambulatory Visit: Payer: Self-pay | Admitting: Family Medicine

## 2017-01-05 ENCOUNTER — Other Ambulatory Visit: Payer: Self-pay | Admitting: Family Medicine

## 2017-01-08 ENCOUNTER — Other Ambulatory Visit: Payer: Self-pay | Admitting: Family Medicine

## 2017-01-08 NOTE — Telephone Encounter (Signed)
#  [phone] was faxed on [phone]/thx [id number]

## 2017-01-30 LAB — COLOGUARD: Cologuard: NEGATIVE

## 2017-01-30 LAB — HM COLONOSCOPY

## 2017-02-05 ENCOUNTER — Other Ambulatory Visit: Payer: Self-pay | Admitting: Family Medicine

## 2017-02-12 ENCOUNTER — Encounter: Payer: Self-pay | Admitting: Family

## 2017-02-12 ENCOUNTER — Ambulatory Visit (INDEPENDENT_AMBULATORY_CARE_PROVIDER_SITE_OTHER): Payer: Federal, State, Local not specified - PPO | Admitting: Family

## 2017-02-12 ENCOUNTER — Telehealth: Payer: Self-pay | Admitting: *Deleted

## 2017-02-12 VITALS — BP 160/100 | HR 73 | Temp 97.9°F | Resp 18 | Ht 66.0 in | Wt 163.0 lb

## 2017-02-12 DIAGNOSIS — I10 Essential (primary) hypertension: Secondary | ICD-10-CM | POA: Diagnosis not present

## 2017-02-12 DIAGNOSIS — Z23 Encounter for immunization: Secondary | ICD-10-CM

## 2017-02-12 MED ORDER — LISINOPRIL 20 MG PO TABS: 20.0000 mg | ORAL_TABLET | Freq: Every day | ORAL | 2 refills | Status: DC

## 2017-02-12 NOTE — Telephone Encounter (Signed)
Per request from PCP, need to cancel Lisinopril Rx that was sent to [facility]. Called and spoke with Izora Gala, she has cancelled Rx.

## 2017-02-12 NOTE — Patient Instructions (Signed)
Please complete lab work prior to leaving. Increase lisinopril from 10mg to 20mg once daily. 

## 2017-02-12 NOTE — Progress Notes (Signed)
Subjective:    Patient ID: Gregory Carr, male    DOB: [birth year], 66 y.o.   MRN: [id number]  HPI   Patient is a 66 year old male who presents today with concerns about his blood pressure. He was last seen in our clinic back in March 2018 and his blood pressure was controlled at that time.  He reports that he stopped his lisinopril for some time (because "my blood pressure was normal").  BP rose and then restarted  4 days ago.    BP Readings from Last 3 Encounters:  02/12/17 (!) 160/100  07/17/16 (!) 141/87  05/25/16 (!) 145/91     Review of Systems See HPI  Past Medical History:  Diagnosis Date  . Abdominal pain, left lower quadrant 01/17/2015  . BACK PAIN, CHRONIC 11/11/2007   Qualifier: Diagnosis of  By: Redmond Pulling MD, Frann Rider    . BARRETTS ESOPHAGUS 11/11/2007   Qualifier: Diagnosis of  By: Redmond Pulling MD, Frann Rider    . BPH (benign prostatic hyperplasia) 09/13/2012  . BPH (benign prostatic hyperplasia) 09/13/2012   Follows with [facility]   . ERECTILE DYSFUNCTION 08/16/2006   Qualifier: Diagnosis of  By: Cletus Gash MD, [place]    . GERD 08/16/2006   Qualifier: Diagnosis of  By: Cletus Gash MD, [place]    . [place] SYNDROME 01/22/2008   Qualifier: Diagnosis of  By: Redmond Pulling MD, Frann Rider    . GLUCOSE INTOLERANCE 12/11/2007   Qualifier: Diagnosis of  By: Redmond Pulling MD, Frann Rider     . Grief reaction 11/07/2015  . HTN (hypertension) 03/24/2011  . HYPERLIPIDEMIA 08/16/2006   Qualifier: Diagnosis of  By: Cletus Gash MD, [place]    . Onychomycosis 09/13/2012  . Other anxiety states 04/08/2007   Qualifier: Diagnosis of  By: Cletus Gash MD, [place]    . Preventative health care 09/29/2013  . Renal cell carcinoma ([place]) 12/15/2011   S/p left nephrectomy Follows with [facility]   . Right hip pain 07/21/2016     Social History   Social History  . Marital status: Married    Spouse name: N/A  . Number of children: N/A  . Years of education: N/A   Occupational History  . Not on file.   Social  History Main Topics  . Smoking status: Former Smoker    Types: Cigarettes  . Smokeless tobacco: Never Used  . Alcohol use Yes     Comment: rare  . Drug use: No  . Sexual activity: Yes     Comment: lives with wife, no dietary restrictions, regular exercise   Other Topics Concern  . Not on file   Social History Narrative  . No narrative on file    No past surgical history on file.  Family History  Problem Relation Age of Onset  . Dementia Mother   . Cancer Daughter        brain cancer, glioblastoma  . Hypertension Maternal Grandfather   . Heart disease Maternal Grandfather        MI  . Stroke Paternal Grandfather   . Arthritis Sister     No Known Allergies  Current Outpatient Prescriptions on File Prior to Visit  Medication Sig Dispense Refill  . amphetamine-dextroamphetamine (ADDERALL) 20 MG tablet Take 20 mg by mouth 3 (three) times daily. Reported on 10/28/2015    . atorvastatin (LIPITOR) 80 MG tablet Take 1 tablet (80 mg total) by mouth daily. 90 tablet 3  . Coenzyme Q10 (COQ-10) 400 MG CAPS Take 400 mg by mouth daily. Reported on 10/28/2015    .  diphenhydrAMINE (SOMINEX) 25 MG tablet Take 25 mg by mouth 2 (two) times daily as needed. Reported on 10/28/2015    . esomeprazole (NEXIUM) 40 MG capsule TAKE 1 CAPSULE DAILY BEFOREBREAKFAST 90 capsule 1  . Eszopiclone (ESZOPICLONE) 3 MG TABS Take 3 mg by mouth at bedtime. Reported on 10/28/2015    . Fiber CAPS Take by mouth daily. Reported on 10/28/2015     No current facility-administered medications on file prior to visit.     BP (!) 160/100   Pulse 73   Temp 97.9 F (36.6 C) (Oral)   Resp 18   Ht 5\' 6"  (1.676 m)   Wt 163 lb (73.9 kg)   SpO2 99%   BMI 26.31 kg/m       Objective:   Physical Exam  Constitutional: He is oriented to person, place, and time. He appears well-developed and well-nourished. No distress.  HENT:  Head: Normocephalic and atraumatic.  Cardiovascular: Normal rate and regular rhythm.   No murmur  heard. Pulmonary/Chest: Effort normal and breath sounds normal. No respiratory distress. He has no wheezes. He has no rales.  Musculoskeletal: He exhibits no edema.  Neurological: He is alert and oriented to person, place, and time.  Skin: Skin is warm and dry.  Psychiatric: He has a normal mood and affect. His behavior is normal. Thought content normal.          Assessment & Plan:  HTN- uncontrolled. Will increase lisinopril from 10mg  to 20mg .  Check follow up bmet. Discussed importance of compliance with BP meds. Follow up with PCP in 3 weeks.

## 2017-02-13 LAB — BASIC METABOLIC PANEL
BUN: 21 mg/dL (ref 6–23)
CO2: 31 mEq/L (ref 19–32)
Calcium: 9.2 mg/dL (ref 8.4–10.5)
Chloride: 101 mEq/L (ref 96–112)
Creatinine, Ser: 0.89 mg/dL (ref 0.40–1.50)
GFR: 91.23 mL/min (ref >60.00)
GLUCOSE: 94 mg/dL (ref 70–99)
POTASSIUM: 4.1 meq/L (ref 3.5–5.1)
SODIUM: 139 meq/L (ref 135–145)

## 2017-02-20 ENCOUNTER — Telehealth: Payer: Self-pay | Admitting: Family Medicine

## 2017-02-20 ENCOUNTER — Encounter (HOSPITAL_COMMUNITY): Payer: Self-pay | Admitting: Emergency Medicine

## 2017-02-20 ENCOUNTER — Emergency Department (HOSPITAL_COMMUNITY)
Admission: EM | Admit: 2017-02-20 | Discharge: 2017-02-20 | Disposition: A | Discharge status: Home / Self Care | Payer: Federal, State, Local not specified - PPO | Attending: Emergency Medicine | Admitting: Emergency Medicine

## 2017-02-20 DIAGNOSIS — R51 Headache: Secondary | ICD-10-CM | POA: Diagnosis present

## 2017-02-20 DIAGNOSIS — I10 Essential (primary) hypertension: Secondary | ICD-10-CM | POA: Diagnosis not present

## 2017-02-20 DIAGNOSIS — Z87891 Personal history of nicotine dependence: Secondary | ICD-10-CM | POA: Insufficient documentation

## 2017-02-20 DIAGNOSIS — Z79899 Other long term (current) drug therapy: Secondary | ICD-10-CM | POA: Diagnosis not present

## 2017-02-20 DIAGNOSIS — Z85528 Personal history of other malignant neoplasm of kidney: Secondary | ICD-10-CM | POA: Insufficient documentation

## 2017-02-20 NOTE — ED Notes (Signed)
Patient states he saw his PCP 7 days ago and was told to double BP medications. For past 7 days patient has taken 20mg  instead of 10mg  of BP medication.

## 2017-02-20 NOTE — ED Provider Notes (Signed)
[facility] DEPT Provider Note   CSN: [id number] Arrival date & time: 02/20/17  1414     History   Chief Complaint Chief Complaint  Patient presents with  . Hypertension    HPI DELVECCHIO Carr is a 66 y.o. male with a history of hypertension, hyperlipidemia who presents to the emergency department today for elevated blood pressure.  The patient states that he saw his PCP on 02/12/2017 where he was found to have a elevated blood pressure.  He was changed from 10 mg to 20 mg of lisinopril.  Patient reports that he has been taking blood pressure at home and been having readings that are 160/100.  Triage his blood pressure is 159/95.  He has been taking his medication as prescribed.  He reports that 3 days ago he began having intermittent mild headaches in the posterior occiput.  He says the pain is relieved shortly after taking 600 mg of ibuprofen.  He does not have a headache currently. He denies chest pain, shortness of breath, doe, fever, syncope, head trauma, photophobia, phonophobia, UL throbbing pain, N/V, visual changes, blurred vision, diplopia, dizziness, stiff neck, neck pain, or "thunderclap" onset. Not first HA. Not worst HA of life. Says he has a history of headaches in the past and these are similar.   HPI  Past Medical History:  Diagnosis Date  . Abdominal pain, left lower quadrant 01/17/2015  . BACK PAIN, CHRONIC 11/11/2007   Qualifier: Diagnosis of  By: Redmond Pulling MD, Frann Rider    . BARRETTS ESOPHAGUS 11/11/2007   Qualifier: Diagnosis of  By: Redmond Pulling MD, Frann Rider    . BPH (benign prostatic hyperplasia) 09/13/2012  . BPH (benign prostatic hyperplasia) 09/13/2012   Follows with [facility]   . ERECTILE DYSFUNCTION 08/16/2006   Qualifier: Diagnosis of  By: Cletus Gash MD, [place]    . GERD 08/16/2006   Qualifier: Diagnosis of  By: Cletus Gash MD, [place]    . [place] SYNDROME 01/22/2008   Qualifier: Diagnosis of  By: Redmond Pulling MD, Frann Rider    . GLUCOSE  INTOLERANCE 12/11/2007   Qualifier: Diagnosis of  By: Redmond Pulling MD, Frann Rider     . Grief reaction 11/07/2015  . HTN (hypertension) 03/24/2011  . HYPERLIPIDEMIA 08/16/2006   Qualifier: Diagnosis of  By: Cletus Gash MD, [place]    . Onychomycosis 09/13/2012  . Other anxiety states 04/08/2007   Qualifier: Diagnosis of  By: Cletus Gash MD, [place]    . Preventative health care 09/29/2013  . Renal cell carcinoma ([place]) 12/15/2011   S/p left nephrectomy Follows with [facility]   . Right hip pain 07/21/2016    Patient Active Problem List   Diagnosis Date Noted  . Right hip pain 07/21/2016  . Grief reaction 11/07/2015  . Motion sickness 01/17/2015  . Abdominal pain, left lower quadrant 01/17/2015  . Hyperlipidemia 01/26/2014  . Preventative health care 09/29/2013  . Hallux limitus of left foot 11/08/2012  . Nail deformity 11/08/2012  . Onychomycosis 09/13/2012  . BPH (benign prostatic hyperplasia) 09/13/2012  . Renal cell carcinoma ([place]) 12/15/2011  . Tongue lesion 12/15/2011  . Atypical chest pain 01/11/2011  . FATIGUE 10/04/2009  . MYALGIA 10/12/2008  . 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 GLUCOSE INTOLERANCE 12/11/2007  . OTHER DECREASED WHITE BLOOD CELL COUNT 12/11/2007  . Jaundice, non-neonatal 12/11/2007  . BARRETTS ESOPHAGUS 11/11/2007  . BACK PAIN, CHRONIC 11/11/2007  . INGUINAL HERNIA 05/13/2007  . OTHER ANXIETY STATES 04/08/2007  . APHTHOUS ULCERS 04/08/2007  . Essential hypertension 08/16/2006  .  ERECTILE DYSFUNCTION 08/16/2006  . ADD 08/16/2006  . GERD 08/16/2006  . LOW BACK PAIN 08/16/2006  . INSOMNIA 08/16/2006    History reviewed. No pertinent surgical history.     Home Medications    Prior to Admission medications   Medication Sig Start Date End Date Taking? Authorizing Provider  amphetamine-dextroamphetamine (ADDERALL) 20 MG tablet Take 20 mg by mouth 3 (three) times daily. Reported on 10/28/2015    [provider]  atorvastatin (LIPITOR) 80 MG  tablet Take 1 tablet (80 mg total) by mouth daily. 07/20/16   Bradd Canary, MD  Coenzyme Q10 (COQ-10) 400 MG CAPS Take 400 mg by mouth daily. Reported on 10/28/2015    [provider]  diphenhydrAMINE (SOMINEX) 25 MG tablet Take 25 mg by mouth 2 (two) times daily as needed. Reported on 10/28/2015    [provider]  esomeprazole (NEXIUM) 40 MG capsule TAKE 1 CAPSULE DAILY BEFOREBREAKFAST 10/11/16   Bradd Canary, MD  Eszopiclone (ESZOPICLONE) 3 MG TABS Take 3 mg by mouth at bedtime. Reported on 10/28/2015    [provider]  Fiber CAPS Take by mouth daily. Reported on 10/28/2015    [provider]  lisinopril (PRINIVIL,ZESTRIL) 20 MG tablet Take 1 tablet (20 mg total) by mouth daily. 02/12/17   Sandford Craze, NP  NUCYNTA ER 100 MG 12 hr tablet Take 2 tablets by mouth daily. 01/02/17   [provider]  traZODone (DESYREL) 100 MG tablet Take 200 mg by mouth at bedtime.    [provider]    Family History Family History  Problem Relation Age of Onset  . Dementia Mother   . Cancer Daughter        brain cancer, glioblastoma  . Hypertension Maternal Grandfather   . Heart disease Maternal Grandfather        MI  . Stroke Paternal Grandfather   . Arthritis Sister     Social History Social History  Substance Use Topics  . Smoking status: Former Smoker    Types: Cigarettes  . Smokeless tobacco: Never Used  . Alcohol use Yes     Comment: rare     Allergies   Patient has no known allergies.   Review of Systems Review of Systems  All other systems reviewed and are negative.    Physical Exam Updated Vital Signs BP (!) 161/100 (BP Location: Left Arm)   Pulse 72   Temp 98.3 F (36.8 C) (Oral)   Resp 18   Ht 5\' 5"  (1.651 m)   Wt 72.6 kg (160 lb)   SpO2 99%   BMI 26.63 kg/m   Physical Exam  Constitutional: He appears well-developed and well-nourished.  HENT:  Head: Normocephalic and atraumatic.  Right Ear: External ear  normal.  Left Ear: External ear normal.  Nose: Nose normal.  Mouth/Throat: Uvula is midline, oropharynx is clear and moist and mucous membranes are normal. No tonsillar exudate.  Eyes: Pupils are equal, round, and reactive to light. Right eye exhibits no discharge. Left eye exhibits no discharge. No scleral icterus.  Neck: Trachea normal, normal range of motion, full passive range of motion without pain and phonation normal. Neck supple. No JVD present. No spinous process tenderness present. Carotid bruit is not present. No neck rigidity. Normal range of motion present.  Cardiovascular: Normal rate, regular rhythm and intact distal pulses.   No murmur heard. Pulses:      Radial pulses are 2+ on the right side, and 2+ on the left  side.       Dorsalis pedis pulses are 2+ on the right side, and 2+ on the left side.       Posterior tibial pulses are 2+ on the right side, and 2+ on the left side.  No lower extremity swelling or edema. Calves symmetric in size bilaterally.  Pulmonary/Chest: Effort normal and breath sounds normal. He exhibits no tenderness.  Abdominal: Soft. Bowel sounds are normal. There is no tenderness. There is no rebound and no guarding.  Musculoskeletal: He exhibits no edema.  Lymphadenopathy:    He has no cervical adenopathy.  Neurological: He is alert.  Mental Status:  Alert, oriented, thought content appropriate, able to give a coherent history. Speech fluent without evidence of aphasia. Able to follow 2 step commands without difficulty.  Cranial Nerves:  II:  Peripheral visual fields grossly normal, pupils equal, round, reactive to light III,IV, VI: ptosis not present, extra-ocular motions intact bilaterally  V,VII: smile symmetric, eyebrows raise symmetric, facial light touch sensation equal VIII: hearing grossly normal to voice  X: uvula elevates symmetrically  XI: bilateral shoulder shrug symmetric and strong XII: midline tongue extension without  fassiculations Motor:  Normal tone. 5/5 in upper and lower extremities bilaterally including strong and equal grip strength and dorsiflexion/plantar flexion Sensory: Sensation intact to light touch in all extremities. Negative Romberg.  Deep Tendon Reflexes: 2+ and symmetric in the biceps and patella Cerebellar: normal finger-to-nose with bilateral upper extremities. Normal heel-to -shin balance bilaterally of the lower extremity. No pronator drift.  Gait: normal gait and balance CV: distal pulses palpable throughout   Skin: Skin is warm and dry. No rash noted. He is not diaphoretic.  Psychiatric: He has a normal mood and affect.  Nursing note and vitals reviewed.    ED Treatments / Results  Labs (all labs ordered are listed, but only abnormal results are displayed) Labs Reviewed - No data to display  EKG  EKG Interpretation  Date/Time:  Tuesday February 20 2017 17:50:19 EDT Ventricular Rate:  62 PR Interval:    QRS Duration: 136 QT Interval:  383 QTC Calculation: 389 R Axis:   -70 Text Interpretation:  Sinus rhythm RBBB and LAFB Confirmed by Pricilla Loveless [phone]) on 02/20/2017 6:33:04 PM       Radiology No results found.  Procedures Procedures (including critical care time)  Medications Ordered in ED Medications - No data to display   Initial Impression / Assessment and Plan / ED Course  I have reviewed the triage vital signs and the nursing notes.  Pertinent labs & imaging results that were available during my care of the patient were reviewed by me and considered in my medical decision making (see chart for details).     Patient here from PCP with concerns for high blood pressure. The patient has a history of high blood pressure.  He was recently changed from 10 mg of lisinopril to 20 mg lisinopril.  He notes that he has been having elevated blood pressures of the 160s/90s-100s at home.  Patient is currently asymptomatic.  He has no headache, shortness of  breath, chest pain or neurologic symptoms.  He recently had a b-met on 10/22 showed no evidence of kidney damage.  His neurologic exam is unremarkable and has no focal deficits. No hypertension urgency or emergency. ECG reassuring. I advised the patient to discuss this with their PCP during her follow up visit to decide if medication management is needed for this. Patient case discussed with Dr. Erma Heritage who  is in agreement with plan.  Final Clinical Impressions(s) / ED Diagnoses   Final diagnoses:  Essential hypertension    New Prescriptions New Prescriptions   No medications on file     Lorelle Gibbs 02/20/17 2221    Duffy Bruce, MD 02/21/17 1140

## 2017-02-20 NOTE — ED Triage Notes (Signed)
Patient reports he was sent by PCP for hypertension. Reports recently beginning to take BP meds as prescribed after not taking them for "a couple weeks." BP 159/95 in triage. Reports mild headache x3 days. Denies chest pain, SOB, blurred vision, and weakness. Ambulatory.

## 2017-02-20 NOTE — ED Notes (Signed)
Pt. IV saline lock removed needle intact.

## 2017-02-20 NOTE — ED Notes (Signed)
EKG given to EDP,goldston,MD., for review.

## 2017-02-20 NOTE — Discharge Instructions (Signed)
Your EKG was reassuring. Your labs from 10/22 were reassuring. Please follow up with PCP tomorrow. If you develop worsening or new concerning symptoms you can return to the emergency department for re-evaluation. Please follow DASH diet.

## 2017-02-20 NOTE — Telephone Encounter (Signed)
Patient Name: CLINT BIELLO DOB: [birth year] Initial Comment Caller states he is having high BP, given different medication. Currently: 160/120, seen at office. Nurse Assessment Nurse: Ronnald Ramp, RN, Miranda Date/Time (Eastern Time): 02/20/2017 12:57:23 PM Confirm and document reason for call. If symptomatic, describe symptoms. ---Caller states his BP is 160/120. He has had a headache for 2 days. His dose of Lisinopril was increased recently. Does the patient have any new or worsening symptoms? ---Yes Will a triage be completed? ---Yes Related visit to physician within the last 2 weeks? ---Yes Does the PT have any chronic conditions? (i.e. diabetes, asthma, etc.) ---Yes List chronic conditions. ---HTN Is this a behavioral health or substance abuse call? ---No Guidelines Guideline Title Affirmed Question Affirmed Notes High Blood Pressure [3] Systolic BP >= 159 OR Diastolic >= 458 AND [5] cardiac or neurologic symptoms (e.g., chest pain, difficulty breathing, unsteady gait, blurred vision) Final Disposition User Go to ED Now Ronnald Ramp, RN, Miranda Referrals [facility] - ED

## 2017-02-22 ENCOUNTER — Ambulatory Visit (INDEPENDENT_AMBULATORY_CARE_PROVIDER_SITE_OTHER): Payer: Federal, State, Local not specified - PPO | Admitting: Family Medicine

## 2017-02-22 ENCOUNTER — Encounter: Payer: Self-pay | Admitting: Family Medicine

## 2017-02-22 VITALS — BP 132/80 | HR 75 | Temp 98.6°F | Ht 66.0 in | Wt 160.0 lb

## 2017-02-22 DIAGNOSIS — I10 Essential (primary) hypertension: Secondary | ICD-10-CM

## 2017-02-22 MED ORDER — LISINOPRIL 40 MG PO TABS: 40.0000 mg | ORAL_TABLET | Freq: Every day | ORAL | 2 refills | Status: DC

## 2017-02-22 NOTE — Progress Notes (Signed)
Chief Complaint  Patient presents with  . Hypertension    Subjective ANTE Carr is a 67 y.o. male who presents for hypertension follow up. He does monitor home blood pressures. Blood pressures ranging from 160's/90's on average.  He had been having high readings of the past 1-1/2 weeks.  He was recently increased from 10 mg to 20 mg of his lisinopril.  He went to the emergency department for elevated blood pressure and was recommended to follow-up here. He is compliant with medications. He is on Lisinopril 20 mg daily, recently increased from 10 mg daily on 10/22.  Patient has these side effects of medication: none He is not adhering to a healthy diet overall.   Past Medical History:  Diagnosis Date  . Abdominal pain, left lower quadrant 01/17/2015  . BACK PAIN, CHRONIC 11/11/2007   Qualifier: Diagnosis of  By: Redmond Pulling MD, Frann Rider    . BARRETTS ESOPHAGUS 11/11/2007   Qualifier: Diagnosis of  By: Redmond Pulling MD, Frann Rider    . BPH (benign prostatic hyperplasia) 09/13/2012  . BPH (benign prostatic hyperplasia) 09/13/2012   Follows with [facility]   . ERECTILE DYSFUNCTION 08/16/2006   Qualifier: Diagnosis of  By: Cletus Gash MD, [place]    . GERD 08/16/2006   Qualifier: Diagnosis of  By: Cletus Gash MD, [place]    . [place] SYNDROME 01/22/2008   Qualifier: Diagnosis of  By: Redmond Pulling MD, Frann Rider    . GLUCOSE INTOLERANCE 12/11/2007   Qualifier: Diagnosis of  By: Redmond Pulling MD, Frann Rider     . Grief reaction 11/07/2015  . HTN (hypertension) 03/24/2011  . HYPERLIPIDEMIA 08/16/2006   Qualifier: Diagnosis of  By: Cletus Gash MD, [place]    . Onychomycosis 09/13/2012  . Other anxiety states 04/08/2007   Qualifier: Diagnosis of  By: Cletus Gash MD, [place]    . Preventative health care 09/29/2013  . Renal cell carcinoma ([place]) 12/15/2011   S/p left nephrectomy Follows with [facility]   . Right hip pain 07/21/2016   Family History  Problem Relation Age of Onset  . Dementia Mother   . Cancer Daughter    brain cancer, glioblastoma  . Hypertension Maternal Grandfather   . Heart disease Maternal Grandfather        MI  . Stroke Paternal Grandfather   . Arthritis Sister    Review of Systems Cardiovascular: no chest pain Respiratory:  no shortness of breath  Exam BP 132/80 (BP Location: Left Arm, Patient Position: Sitting, Cuff Size: Normal)   Pulse 75   Temp 98.6 F (37 C) (Oral)   Ht 5\' 6"  (1.676 m)   Wt 160 lb (72.6 kg)   SpO2 95%   BMI 25.82 kg/m  General:  well developed, well nourished, in no apparent distress Skin:  warm, no pallor or diaphoresis Heart : RRR, no murmurs, no bruits, no LE edema Lungs:  clear to auscultation, no accessory muscle use Psych: well oriented with normal range of affect and appropriate judgment/insight  Essential hypertension - Plan: lisinopril (PRINIVIL,ZESTRIL) 40 MG tablet  Orders as above. Increase from 20 mg to 40 mg daily.  Counseled on diet and exercise- healthy diet handout given. F/u with Dr. Charlett Blake as originally scheduled in 2 weeks. The patient voiced understanding and agreement to the plan.  Greater than 25 minutes were spent face to face with the patient with greater than 50% of this time spent counseling on diet, exercise, treatment options for managing his high BP readings.    [place], DO 02/22/17  12:18  PM

## 2017-02-22 NOTE — Patient Instructions (Signed)
Around 3 times per week, check your blood pressure 4 times per day. Twice in the morning and twice in the evening. The readings should be at least one minute apart. Write down these values and bring them to your next nurse visit/appointment.  When you check your BP, make sure you have been doing something calm/relaxing 5 minutes prior to checking. Both feet should be flat on the floor and you should be sitting. Use your left arm and make sure it is in a relaxed position (on a table), and that the cuff is at the approximate level/height of your heart.  Healthy Eating Plan Many factors influence your heart health, including eating and exercise habits. Heart (coronary) risk increases with abnormal blood fat (lipid) levels. Heart-healthy meal planning includes limiting unhealthy fats, increasing healthy fats, and making other small dietary changes. This includes maintaining a healthy body weight to help keep lipid levels within a normal range.  WHAT IS MY PLAN?  Your health care provider recommends that you:  Drink a glass of water before meals to help with satiety.  Eat slowly.  An alternative to the water is to add Metamucil. This will help with satiety as well. It does contain calories, unlike water.  WHAT TYPES OF FAT SHOULD I CHOOSE?  Choose healthy fats more often. Choose monounsaturated and polyunsaturated fats, such as olive oil and canola oil, flaxseeds, walnuts, almonds, and seeds.  Eat more omega-3 fats. Good choices include salmon, mackerel, sardines, tuna, flaxseed oil, and ground flaxseeds. Aim to eat fish at least two times each week.  Avoid foods with partially hydrogenated oils in them. These contain trans fats. Examples of foods that contain trans fats are stick margarine, some tub margarines, cookies, crackers, and other baked goods. If you are going to avoid a fat, this is the one to avoid!  WHAT GENERAL GUIDELINES DO I NEED TO FOLLOW?  Check food labels carefully to  identify foods with trans fats. Avoid these types of options when possible.  Fill one half of your plate with vegetables and green salads. Eat 4-5 servings of vegetables per day. A serving of vegetables equals 1 cup of raw leafy vegetables,  cup of raw or cooked cut-up vegetables, or  cup of vegetable juice.  Fill one fourth of your plate with whole grains. Look for the word "whole" as the first word in the ingredient list.  Fill one fourth of your plate with lean protein foods.  Eat 4-5 servings of fruit per day. A serving of fruit equals one medium whole fruit,  cup of dried fruit,  cup of fresh, frozen, or canned fruit. Try to avoid fruits in cups/syrups as the sugar content can be high.  Eat more foods that contain soluble fiber. Examples of foods that contain this type of fiber are apples, broccoli, carrots, beans, peas, and barley. Aim to get 20-30 g of fiber per day.  Eat more home-cooked food and less restaurant, buffet, and fast food.  Limit or avoid alcohol.  Limit foods that are high in starch and sugar.  Avoid fried foods when able.  Cook foods by using methods other than frying. Baking, boiling, grilling, and broiling are all great options. Other fat-reducing suggestions include: ? Removing the skin from poultry. ? Removing all visible fats from meats. ? Skimming the fat off of stews, soups, and gravies before serving them. ? Steaming vegetables in water or broth.  Lose weight if you are overweight. Losing just 5-10% of your initial body  weight can help your overall health and prevent diseases such as diabetes and heart disease.  Increase your consumption of nuts, legumes, and seeds to 4-5 servings per week. One serving of dried beans or legumes equals  cup after being cooked, one serving of nuts equals 1 ounces, and one serving of seeds equals  ounce or 1 tablespoon.  WHAT ARE GOOD FOODS CAN I EAT? Grains Grainy breads (try to find bread that is 3 g of fiber per  slice or greater), oatmeal, light popcorn. Whole-grain cereals. Rice and pasta, including brown rice and those that are made with whole wheat. Edamame pasta is a great alternative to grain pasta. It has a higher protein content. Try to avoid significant consumption of white bread, sugary cereals, or pastries/baked goods.  Vegetables All vegetables. Cooked white potatoes do not count as vegetables.  Fruits All fruits, but limit pineapple and bananas as these fruits have a higher sugar content.  Meats and Other Protein Sources Lean, well-trimmed beef, veal, pork, and lamb. Chicken and [place] without skin. All fish and shellfish. Wild duck, rabbit, pheasant, and venison. Egg whites or low-cholesterol egg substitutes. Dried beans, peas, lentils, and tofu.Seeds and most nuts.  Dairy Low-fat or nonfat cheeses, including ricotta, string, and mozzarella. Skim or 1% milk that is liquid, powdered, or evaporated. Buttermilk that is made with low-fat milk. Nonfat or low-fat yogurt. Soy/Almond milk are good alternatives if you cannot handle dairy.  Beverages Water is the best for you. Sports drinks with less sugar are more desirable unless you are a highly active athlete.  Sweets and Desserts Sherbets and fruit ices. Honey, jam, marmalade, jelly, and syrups. Dark chocolate.  Eat all sweets and desserts in moderation.  Fats and Oils Nonhydrogenated (trans-free) margarines. Vegetable oils, including soybean, sesame, sunflower, olive, peanut, safflower, corn, canola, and cottonseed. Salad dressings or mayonnaise that are made with a vegetable oil. Limit added fats and oils that you use for cooking, baking, salads, and as spreads.  Other Cocoa powder. Coffee and tea. Most condiments.  The items listed above may not be a complete list of recommended foods or beverages. Contact your dietitian for more options.

## 2017-02-22 NOTE — Progress Notes (Signed)
Pre visit review using our clinic review tool, if applicable. No additional management support is needed unless otherwise documented below in the visit note. 

## 2017-03-08 ENCOUNTER — Encounter: Payer: Self-pay | Admitting: Family Medicine

## 2017-03-08 ENCOUNTER — Ambulatory Visit: Payer: Federal, State, Local not specified - PPO | Admitting: Family Medicine

## 2017-03-08 DIAGNOSIS — E782 Mixed hyperlipidemia: Secondary | ICD-10-CM

## 2017-03-08 DIAGNOSIS — E739 Lactose intolerance, unspecified: Secondary | ICD-10-CM | POA: Diagnosis not present

## 2017-03-08 DIAGNOSIS — I10 Essential (primary) hypertension: Secondary | ICD-10-CM | POA: Diagnosis not present

## 2017-03-08 MED ORDER — LISINOPRIL 20 MG PO TABS: 20.0000 mg | ORAL_TABLET | Freq: Two times a day (BID) | ORAL | 1 refills | Status: DC

## 2017-03-08 NOTE — Assessment & Plan Note (Signed)
Encouraged heart healthy diet, increase exercise, avoid trans fats, consider a krill oil cap daily 

## 2017-03-08 NOTE — Progress Notes (Signed)
Subjective:  I acted as a Education administrator for [employer]. Gregory Carr, [place]   Patient ID: Gregory Carr, male    DOB: 05/19/1950, 66 y.o.   MRN: [id number]  Chief Complaint  Patient presents with  . Follow-up    HPI  Patient is in today for follow up and he is doing well. No recent febrile illness or acute hospitalizations. No polyuria or polydipsia. Is trying to eat well and stay active. Denies CP/palp/SOB/HA/congestion/fevers/GI or GU c/o. Taking meds as prescribed Patient Care Team: Mosie Lukes, MD as PCP - General (Family Medicine) Franchot Gallo, MD as Consulting Physician (Urology)   Past Medical History:  Diagnosis Date  . Abdominal pain, left lower quadrant 01/17/2015  . BACK PAIN, CHRONIC 11/11/2007   Qualifier: Diagnosis of  By: Redmond Pulling MD, Frann Rider    . BARRETTS ESOPHAGUS 11/11/2007   Qualifier: Diagnosis of  By: Redmond Pulling MD, Frann Rider    . BPH (benign prostatic hyperplasia) 09/13/2012  . BPH (benign prostatic hyperplasia) 09/13/2012   Follows with [facility]   . ERECTILE DYSFUNCTION 08/16/2006   Qualifier: Diagnosis of  By: Cletus Gash MD, [place]    . GERD 08/16/2006   Qualifier: Diagnosis of  By: Cletus Gash MD, [place]    . [place] SYNDROME 01/22/2008   Qualifier: Diagnosis of  By: Redmond Pulling MD, Frann Rider    . GLUCOSE INTOLERANCE 12/11/2007   Qualifier: Diagnosis of  By: Redmond Pulling MD, Frann Rider     . Grief reaction 11/07/2015  . HTN (hypertension) 03/24/2011  . HYPERLIPIDEMIA 08/16/2006   Qualifier: Diagnosis of  By: Cletus Gash MD, [place]    . Onychomycosis 09/13/2012  . Other anxiety states 04/08/2007   Qualifier: Diagnosis of  By: Cletus Gash MD, [place]    . Preventative health care 09/29/2013  . Renal cell carcinoma ([place]) 12/15/2011   S/p left nephrectomy Follows with [facility]   . Right hip pain 07/21/2016    History reviewed. No pertinent surgical history.  Family History  Problem Relation Age of Onset  . Dementia Mother   . Cancer Daughter        brain cancer, glioblastoma    . Hypertension Maternal Grandfather   . Heart disease Maternal Grandfather        MI  . Stroke Paternal Grandfather   . Arthritis Sister     Social History   Socioeconomic History  . Marital status: Married    Spouse name: Not on file  . Number of children: Not on file  . Years of education: Not on file  . Highest education level: Not on file  Social Needs  . Financial resource strain: Not on file  . Food insecurity - worry: Not on file  . Food insecurity - inability: Not on file  . Transportation needs - medical: Not on file  . Transportation needs - non-medical: Not on file  Occupational History  . Not on file  Tobacco Use  . Smoking status: Former Smoker    Types: Cigarettes  . Smokeless tobacco: Never Used  Substance and Sexual Activity  . Alcohol use: Yes    Comment: rare  . Drug use: No  . Sexual activity: Yes    Comment: lives with wife, no dietary restrictions, regular exercise  Other Topics Concern  . Not on file  Social History Narrative  . Not on file    Outpatient Medications Prior to Visit  Medication Sig Dispense Refill  . amphetamine-dextroamphetamine (ADDERALL) 20 MG tablet Take 20 mg by mouth 3 (three) times daily. Reported on  10/28/2015    . atorvastatin (LIPITOR) 80 MG tablet Take 1 tablet (80 mg total) by mouth daily. 90 tablet 3  . Coenzyme Q10 (COQ-10) 400 MG CAPS Take 400 mg by mouth daily. Reported on 10/28/2015    . esomeprazole (NEXIUM) 40 MG capsule TAKE 1 CAPSULE DAILY BEFOREBREAKFAST 90 capsule 1  . Eszopiclone (ESZOPICLONE) 3 MG TABS Take 3 mg by mouth at bedtime. Reported on 10/28/2015    . Fiber CAPS Take by mouth daily. Reported on 10/28/2015    . NUCYNTA ER 100 MG 12 hr tablet Take 2 tablets by mouth daily.    . traZODone (DESYREL) 100 MG tablet Take 200 mg by mouth at bedtime.    Marland Kitchen lisinopril (PRINIVIL,ZESTRIL) 40 MG tablet Take 1 tablet (40 mg total) by mouth daily. 30 tablet 2   No facility-administered medications prior to visit.      No Known Allergies  Review of Systems  Constitutional: Negative for fever and malaise/fatigue.  HENT: Negative for congestion.   Eyes: Negative for blurred vision.  Respiratory: Negative for shortness of breath.   Cardiovascular: Negative for chest pain, palpitations and leg swelling.  Gastrointestinal: Negative for abdominal pain, blood in stool and nausea.  Genitourinary: Negative for dysuria and frequency.  Musculoskeletal: Negative for falls.  Skin: Negative for rash.  Neurological: Negative for dizziness, loss of consciousness and headaches.  Endo/Heme/Allergies: Negative for environmental allergies.  Psychiatric/Behavioral: Negative for depression. The patient is not nervous/anxious.        Objective:    Physical Exam  Constitutional: He is oriented to person, place, and time. He appears well-developed and well-nourished. No distress.  HENT:  Head: Normocephalic and atraumatic.  Nose: Nose normal.  Eyes: Right eye exhibits no discharge. Left eye exhibits no discharge.  Neck: Normal range of motion. Neck supple.  Cardiovascular: Normal rate and regular rhythm.  No murmur heard. Pulmonary/Chest: Effort normal and breath sounds normal.  Abdominal: Soft. Bowel sounds are normal. There is no tenderness.  Musculoskeletal: He exhibits no edema.  Neurological: He is alert and oriented to person, place, and time.  Skin: Skin is warm and dry.  Psychiatric: He has a normal mood and affect.  Nursing note and vitals reviewed.   BP (!) 133/94 (BP Location: Right Arm, Patient Position: Sitting, Cuff Size: Large)   Pulse 89   Temp 97.7 F (36.5 C) (Oral)   Resp 16   Ht 5' 6.14" (1.68 m)   Wt 161 lb 3.2 oz (73.1 kg)   SpO2 97%   BMI 25.91 kg/m  Wt Readings from Last 3 Encounters:  03/08/17 161 lb 3.2 oz (73.1 kg)  02/22/17 160 lb (72.6 kg)  02/20/17 160 lb (72.6 kg)   BP Readings from Last 3 Encounters:  03/08/17 (!) 133/94  02/22/17 132/80  02/20/17 (!) 148/95      Immunization History  Administered Date(s) Administered  . H1N1 05/18/2008  . Influenza Whole 04/02/2007, 01/20/2008, 01/22/2009, 03/08/2010  . Influenza,inj,Quad PF,6+ Mos 01/26/2014, 01/12/2015, 02/12/2017  . Pneumococcal Conjugate-13 04/08/2013  . Td 12/10/2006    Health Maintenance  Topic Date Due  . Hepatitis C Screening  1950/06/09  . HIV Screening  04/22/1967  . TETANUS/TDAP  12/09/2016  . COLONOSCOPY  04/25/2019  . INFLUENZA VACCINE  Completed    Lab Results  Component Value Date   WBC 8.5 03/08/2017   HGB 15.1 03/08/2017   HCT 44.0 03/08/2017   PLT 195.0 03/08/2017   GLUCOSE 91 03/08/2017   CHOL 143 03/08/2017  TRIG 134.0 03/08/2017   HDL 49.90 03/08/2017   LDLDIRECT 82.0 07/31/2014   LDLCALC 67 03/08/2017   ALT 25 03/08/2017   AST 24 03/08/2017   NA 142 03/08/2017   K 4.6 03/08/2017   CL 104 03/08/2017   CREATININE 0.98 03/08/2017   BUN 17 03/08/2017   CO2 29 03/08/2017   TSH 2.23 03/08/2017   PSA 4.37 (H) 07/31/2014   HGBA1C 6.0 07/17/2016    Lab Results  Component Value Date   TSH 2.23 03/08/2017   Lab Results  Component Value Date   WBC 8.5 03/08/2017   HGB 15.1 03/08/2017   HCT 44.0 03/08/2017   MCV 91.9 03/08/2017   PLT 195.0 03/08/2017   Lab Results  Component Value Date   NA 142 03/08/2017   K 4.6 03/08/2017   CO2 29 03/08/2017   GLUCOSE 91 03/08/2017   BUN 17 03/08/2017   CREATININE 0.98 03/08/2017   BILITOT 1.4 (H) 03/08/2017   ALKPHOS 71 03/08/2017   AST 24 03/08/2017   ALT 25 03/08/2017   PROT 6.7 03/08/2017   ALBUMIN 4.3 03/08/2017   CALCIUM 9.6 03/08/2017   GFR 81.62 03/08/2017   Lab Results  Component Value Date   CHOL 143 03/08/2017   Lab Results  Component Value Date   HDL 49.90 03/08/2017   Lab Results  Component Value Date   LDLCALC 67 03/08/2017   Lab Results  Component Value Date   TRIG 134.0 03/08/2017   Lab Results  Component Value Date   CHOLHDL 3 03/08/2017   Lab Results  Component  Value Date   HGBA1C 6.0 07/17/2016         Assessment & Plan:   Problem List Items Addressed This Visit    GLUCOSE INTOLERANCE    hgba1c acceptable, minimize simple carbs. Increase exercise as tolerated.      Essential hypertension    Well controlled, no changes to meds. Encouraged heart healthy diet such as the DASH diet and exercise as tolerated.       Relevant Medications   lisinopril (PRINIVIL,ZESTRIL) 20 MG tablet   Other Relevant Orders   CBC (Completed)   Comprehensive metabolic panel (Completed)   TSH (Completed)   Hyperlipidemia    Encouraged heart healthy diet, increase exercise, avoid trans fats, consider a krill oil cap daily      Relevant Medications   lisinopril (PRINIVIL,ZESTRIL) 20 MG tablet   Other Relevant Orders   Lipid panel (Completed)      I have discontinued Wenda Low. Stegemann's lisinopril. I am also having him start on lisinopril. Additionally, I am having him maintain his amphetamine-dextroamphetamine, Eszopiclone, CoQ-10, Fiber, atorvastatin, esomeprazole, NUCYNTA ER, and traZODone.  Meds ordered this encounter  Medications  . lisinopril (PRINIVIL,ZESTRIL) 20 MG tablet    Sig: Take 1 tablet (20 mg total) 2 (two) times daily by mouth.    Dispense:  180 tablet    Refill:  1    CMA served as scribe during this visit. History, Physical and Plan performed by medical provider. Documentation and orders reviewed and attested to.  Penni Homans, MD

## 2017-03-08 NOTE — Assessment & Plan Note (Signed)
Well controlled, no changes to meds. Encouraged heart healthy diet such as the DASH diet and exercise as tolerated.  °

## 2017-03-08 NOTE — Assessment & Plan Note (Signed)
hgba1c acceptable, minimize simple carbs. Increase exercise as tolerated.  

## 2017-03-08 NOTE — Patient Instructions (Addendum)
shingrix is the new shingles shot, 2 shots over 2-6 months, can get at office or at pharmacy, check with insurance and confirm they cover, write down name, date and time of person you spoke with.  Call here and see if we have it yet.   BP<140/90 call if routinely higher Hypertension Hypertension is another name for high blood pressure. High blood pressure forces your heart to work harder to pump blood. This can cause problems over time. There are two numbers in a blood pressure reading. There is a top number (systolic) over a bottom number (diastolic). It is best to have a blood pressure below 120/80. Healthy choices can help lower your blood pressure. You may need medicine to help lower your blood pressure if:  Your blood pressure cannot be lowered with healthy choices.  Your blood pressure is higher than 130/80.  Follow these instructions at home: Eating and drinking  If directed, follow the DASH eating plan. This diet includes: ? Filling half of your plate at each meal with fruits and vegetables. ? Filling one quarter of your plate at each meal with whole grains. Whole grains include whole wheat pasta, brown rice, and whole grain bread. ? Eating or drinking low-fat dairy products, such as skim milk or low-fat yogurt. ? Filling one quarter of your plate at each meal with low-fat (lean) proteins. Low-fat proteins include fish, skinless chicken, eggs, beans, and tofu. ? Avoiding fatty meat, cured and processed meat, or chicken with skin. ? Avoiding premade or processed food.  Eat less than 1,500 mg of salt (sodium) a day.  Limit alcohol use to no more than 1 drink a day for nonpregnant women and 2 drinks a day for men. One drink equals 12 oz of beer, 5 oz of wine, or 1 oz of hard liquor. Lifestyle  Work with your doctor to stay at a healthy weight or to lose weight. Ask your doctor what the best weight is for you.  Get at least 30 minutes of exercise that causes your heart to beat  faster (aerobic exercise) most days of the week. This may include walking, swimming, or biking.  Get at least 30 minutes of exercise that strengthens your muscles (resistance exercise) at least 3 days a week. This may include lifting weights or pilates.  Do not use any products that contain nicotine or tobacco. This includes cigarettes and e-cigarettes. If you need help quitting, ask your doctor.  Check your blood pressure at home as told by your doctor.  Keep all follow-up visits as told by your doctor. This is important. Medicines  Take over-the-counter and prescription medicines only as told by your doctor. Follow directions carefully.  Do not skip doses of blood pressure medicine. The medicine does not work as well if you skip doses. Skipping doses also puts you at risk for problems.  Ask your doctor about side effects or reactions to medicines that you should watch for. Contact a doctor if:  You think you are having a reaction to the medicine you are taking.  You have headaches that keep coming back (recurring).  You feel dizzy.  You have swelling in your ankles.  You have trouble with your vision. Get help right away if:  You get a very bad headache.  You start to feel confused.  You feel weak or numb.  You feel faint.  You get very bad pain in your: ? Chest. ? Belly (abdomen).  You throw up (vomit) more than once.  You  have trouble breathing. Summary  Hypertension is another name for high blood pressure.  Making healthy choices can help lower blood pressure. If your blood pressure cannot be controlled with healthy choices, you may need to take medicine. This information is not intended to replace advice given to you by your health care provider. Make sure you discuss any questions you have with your health care provider. Document Released: 09/27/2007 Document Revised: 03/08/2016 Document Reviewed: 03/08/2016 Elsevier Interactive Patient Education  [employer].

## 2017-03-09 LAB — COMPREHENSIVE METABOLIC PANEL
ALT: 25 U/L (ref 0–53)
AST: 24 U/L (ref 0–37)
Albumin: 4.3 g/dL (ref 3.5–5.2)
Alkaline Phosphatase: 71 U/L (ref 39–117)
BILIRUBIN TOTAL: 1.4 mg/dL — AB (ref 0.2–1.2)
BUN: 17 mg/dL (ref 6–23)
CALCIUM: 9.6 mg/dL (ref 8.4–10.5)
CHLORIDE: 104 meq/L (ref 96–112)
CO2: 29 meq/L (ref 19–32)
Creatinine, Ser: 0.98 mg/dL (ref 0.40–1.50)
GFR: 81.62 mL/min (ref >60.00)
GLUCOSE: 91 mg/dL (ref 70–99)
POTASSIUM: 4.6 meq/L (ref 3.5–5.1)
Sodium: 142 mEq/L (ref 135–145)
Total Protein: 6.7 g/dL (ref 6.0–8.3)

## 2017-03-09 LAB — CBC
HEMATOCRIT: 44 % (ref 39.0–52.0)
HEMOGLOBIN: 15.1 g/dL (ref 13.0–17.0)
MCHC: 34.4 g/dL (ref 30.0–36.0)
MCV: 91.9 fl (ref 78.0–100.0)
PLATELETS: 195 10*3/uL (ref 150.0–400.0)
RBC: 4.79 Mil/uL (ref 4.22–5.81)
RDW: 13.3 % (ref 11.5–15.5)
WBC: 8.5 10*3/uL (ref 4.0–10.5)

## 2017-03-09 LAB — LIPID PANEL
CHOL/HDL RATIO: 3
Cholesterol: 143 mg/dL (ref 0–200)
HDL: 49.9 mg/dL (ref >39.00)
LDL Cholesterol: 67 mg/dL (ref 0–99)
NonHDL: 93.49
TRIGLYCERIDES: 134 mg/dL (ref 0.0–149.0)
VLDL: 26.8 mg/dL (ref 0.0–40.0)

## 2017-03-09 LAB — TSH: TSH: 2.23 u[IU]/mL (ref 0.35–4.50)

## 2017-03-23 ENCOUNTER — Telehealth: Payer: Self-pay | Admitting: Family Medicine

## 2017-03-23 NOTE — Telephone Encounter (Signed)
Copied from [place] [phone] [phone]. Topic: Quick Communication - See Telephone Encounter >> Mar 23, 2017  2:35 PM Synthia Innocent wrote: CRM for notification. See Telephone encounter for:  Requesting refills on  atorvastatin (LIPITOR) 80 MG tablet  esomeprazole (NEXIUM) 40 MG capsule

## 2017-03-23 NOTE — Telephone Encounter (Signed)
Copied from [place] [id number]. Topic: Quick Communication - See Telephone Encounter >> Mar 23, 2017  2:35 PM Synthia Innocent wrote: CRM for notification. See Telephone encounter for:  Requesting refills on  atorvastatin (LIPITOR) 80 MG tablet  esomeprazole (NEXIUM) 40 MG capsule  [facility] Fax [phone]

## 2017-03-26 ENCOUNTER — Other Ambulatory Visit: Payer: Self-pay

## 2017-03-26 ENCOUNTER — Other Ambulatory Visit: Payer: Self-pay | Admitting: *Deleted

## 2017-03-26 MED ORDER — ESOMEPRAZOLE MAGNESIUM 40 MG PO CPDR: DELAYED_RELEASE_CAPSULE | ORAL | 1 refills | Status: DC

## 2017-03-26 MED ORDER — ATORVASTATIN CALCIUM 80 MG PO TABS: 80.0000 mg | ORAL_TABLET | Freq: Every day | ORAL | 1 refills | Status: DC

## 2017-03-26 MED ORDER — ATORVASTATIN CALCIUM 80 MG PO TABS: 80.0000 mg | ORAL_TABLET | Freq: Every day | ORAL | 3 refills | Status: DC

## 2017-04-05 ENCOUNTER — Other Ambulatory Visit: Payer: Self-pay | Admitting: Family Medicine

## 2017-04-06 ENCOUNTER — Other Ambulatory Visit: Payer: Self-pay | Admitting: Family Medicine

## 2017-04-18 ENCOUNTER — Ambulatory Visit: Payer: Self-pay | Admitting: Hematology

## 2017-04-18 ENCOUNTER — Encounter: Payer: Self-pay | Admitting: Family Medicine

## 2017-04-18 ENCOUNTER — Ambulatory Visit: Payer: Federal, State, Local not specified - PPO | Admitting: Family Medicine

## 2017-04-18 VITALS — BP 149/87 | HR 72 | Temp 97.9°F | Resp 16 | Ht 65.0 in | Wt 162.2 lb

## 2017-04-18 DIAGNOSIS — I10 Essential (primary) hypertension: Secondary | ICD-10-CM | POA: Diagnosis not present

## 2017-04-18 DIAGNOSIS — Z205 Contact with and (suspected) exposure to viral hepatitis: Secondary | ICD-10-CM | POA: Diagnosis not present

## 2017-04-18 NOTE — Patient Instructions (Signed)
Please have blood drawn today- I will be in touch with infectious disease recommendations asap  Continue the 40 mg of lisinopril for now- please let me know how your BP looks over the next couple of weeks

## 2017-04-18 NOTE — Progress Notes (Signed)
[place] at [facility] [address], [place], [name] [address] [phone]  Date:  04/18/2017   Name:  JIM LUNDIN   DOB:  1951/04/03   MRN:  [id number]  PCP:  Mosie Lukes, MD    Chief Complaint: No chief complaint on file.   History of Present Illness:  DALTON MOLESWORTH is a 66 y.o. very pleasant male patient who presents with the following:  Here today with a possible Hep B exposure Apparently his GF was ill over the weekend and was diagnosed with hep B- she was told it was acute hep B  They have been sexually active together- they have been together for about one year  He has never been tested for hepatitis in the past and has not been immunized per our records, state immunization records of his memory   He has felt well- no jaundice or other symptoms   BP Readings from Last 3 Encounters:  04/18/17 (!) 149/87  03/08/17 (!) 133/94  02/22/17 132/80   He does check his BP at home - he generally runs 140- 150s/ 80- 90s.  However he just changed to taking 40 mg of lisinopril a couple of days ago and did not take his lisinopril today.   I called his "girlfriend" who states that I am not to release any health information not pertaining to hep B to the pt.  However she is willing to allow me to review her chart for the purposes of treating Lundon. She does indeed have hep B per recent admission notes  Reviewed her chart and discussed with Dr. Megan Salon of ID   They have not been intimate for over 2 weeks so HepB IG is not needed However will want to immunize pt and get labs   Patient Active Problem List   Diagnosis Date Noted  . Right hip pain 07/21/2016  . Grief reaction 11/07/2015  . Motion sickness 01/17/2015  . Abdominal pain, left lower quadrant 01/17/2015  . Hyperlipidemia 01/26/2014  . Preventative health care 09/29/2013  . Hallux limitus of left foot 11/08/2012  . Nail deformity 11/08/2012  .  Onychomycosis 09/13/2012  . BPH (benign prostatic hyperplasia) 09/13/2012  . Renal cell carcinoma ([place]) 12/15/2011  . Tongue lesion 12/15/2011  . Atypical chest pain 01/11/2011  . FATIGUE 10/04/2009  . MYALGIA 10/12/2008  . 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 GLUCOSE INTOLERANCE 12/11/2007  . OTHER DECREASED WHITE BLOOD CELL COUNT 12/11/2007  . Jaundice, non-neonatal 12/11/2007  . BARRETTS ESOPHAGUS 11/11/2007  . BACK PAIN, CHRONIC 11/11/2007  . Inguinal hernia 05/13/2007  . OTHER ANXIETY STATES 04/08/2007  . APHTHOUS ULCERS 04/08/2007  . Essential hypertension 08/16/2006  . ERECTILE DYSFUNCTION 08/16/2006  . ADD 08/16/2006  . GERD 08/16/2006  . LOW BACK PAIN 08/16/2006  . INSOMNIA 08/16/2006    Past Medical History:  Diagnosis Date  . Abdominal pain, left lower quadrant 01/17/2015  . BACK PAIN, CHRONIC 11/11/2007   Qualifier: Diagnosis of  By: Redmond Pulling MD, Frann Rider    . BARRETTS ESOPHAGUS 11/11/2007   Qualifier: Diagnosis of  By: Redmond Pulling MD, Frann Rider    . BPH (benign prostatic hyperplasia) 09/13/2012  . BPH (benign prostatic hyperplasia) 09/13/2012   Follows with [facility]   . ERECTILE DYSFUNCTION 08/16/2006   Qualifier: Diagnosis of  By: Cletus Gash MD, [place]    . GERD 08/16/2006   Qualifier: Diagnosis of  By: Cletus Gash MD, [place]    . [place]'S  SYNDROME 01/22/2008   Qualifier: Diagnosis of  By: Redmond Pulling MD, Frann Rider    . GLUCOSE INTOLERANCE 12/11/2007   Qualifier: Diagnosis of  By: Redmond Pulling MD, Frann Rider     . Grief reaction 11/07/2015  . HTN (hypertension) 03/24/2011  . HYPERLIPIDEMIA 08/16/2006   Qualifier: Diagnosis of  By: Cletus Gash MD, [place]    . Onychomycosis 09/13/2012  . Other anxiety states 04/08/2007   Qualifier: Diagnosis of  By: Cletus Gash MD, [place]    . Preventative health care 09/29/2013  . Renal cell carcinoma ([place]) 12/15/2011   S/p left nephrectomy Follows with [facility]   . Right hip pain 07/21/2016    No past surgical history on file.  Social  History   Tobacco Use  . Smoking status: Former Smoker    Types: Cigarettes  . Smokeless tobacco: Never Used  Substance Use Topics  . Alcohol use: Yes    Comment: rare  . Drug use: No    Family History  Problem Relation Age of Onset  . Dementia Mother   . Cancer Daughter        brain cancer, glioblastoma  . Hypertension Maternal Grandfather   . Heart disease Maternal Grandfather        MI  . Stroke Paternal Grandfather   . Arthritis Sister     No Known Allergies  Medication list has been reviewed and updated.  Current Outpatient Medications on File Prior to Visit  Medication Sig Dispense Refill  . amphetamine-dextroamphetamine (ADDERALL) 20 MG tablet Take 20 mg by mouth 3 (three) times daily. Reported on 10/28/2015    . atorvastatin (LIPITOR) 80 MG tablet Take 1 tablet (80 mg total) by mouth daily. 90 tablet 1  . Coenzyme Q10 (COQ-10) 400 MG CAPS Take 400 mg by mouth daily. Reported on 10/28/2015    . esomeprazole (NEXIUM) 40 MG capsule TAKE 1 CAPSULE DAILY BEFOREBREAKFAST 90 capsule 1  . esomeprazole (NEXIUM) 40 MG capsule TAKE 1 CAPSULE DAILY BEFORE BREAKFAST 90 capsule 1  . Eszopiclone (ESZOPICLONE) 3 MG TABS Take 3 mg by mouth at bedtime. Reported on 10/28/2015    . Fiber CAPS Take by mouth daily. Reported on 10/28/2015    . lisinopril (PRINIVIL,ZESTRIL) 10 MG tablet TAKE 1 TABLET BY MOUTH EVERY DAY 90 tablet 0  . lisinopril (PRINIVIL,ZESTRIL) 20 MG tablet Take 1 tablet (20 mg total) 2 (two) times daily by mouth. 180 tablet 1  . NUCYNTA ER 100 MG 12 hr tablet Take 2 tablets by mouth daily.    . traZODone (DESYREL) 100 MG tablet Take 200 mg by mouth at bedtime.     No current facility-administered medications on file prior to visit.     Review of Systems:  As per HPI- otherwise negative.   Physical Examination: Vitals:   04/18/17 1155  BP: (!) 149/87  Pulse: 72  Resp: 16  Temp: 97.9 F (36.6 C)  SpO2: 99%   Vitals:   04/18/17 1155  Weight: 162 lb 3.2 oz (73.6  kg)  Height: 5\' 5"  (1.651 m)   Body mass index is 26.99 kg/m. Ideal Body Weight: Weight in (lb) to have BMI = 25: 149.9  GEN: WDWN, NAD, Non-toxic, A & O x 3 HEENT: Atraumatic, Normocephalic. Neck supple. No masses, No LAD. Ears and Nose: No external deformity. CV: RRR, No M/G/R. No JVD. No thrill. No extra heart sounds. PULM: CTA B, no wheezes, crackles, rhonchi. No retractions. No resp. distress. No accessory muscle use. ABD: S, NT, ND, +BS. No rebound. No  HSM. EXTR: No c/c/e NEURO Normal gait.  PSYCH: Normally interactive. Conversant. Not depressed or anxious appearing.  Calm demeanor.    Assessment and Plan: Exposure to hepatitis B - Plan: Hepatitis C antibody, Hepatitis B surface antibody, Hepatitis B surface antigen, Hepatitis B Core Antibody, IgM, Hepatitis B Core Antibody, total  Essential hypertension  Exposure to hep B.  No sexual exposure in about 2 weeks so Ig is not needed, but he does need immunization series esp if not already [place].  Discussed case with Dr. Andy Gauss pt back at 4pm- he is not able to come back in for immunization right now, but we will visit tomorrow and do his shot, discuss labs.  He is scheduled for 2:15 tomorrow   Discussed his BP.  He has been checking it at home and it is a bit high. However, he just increased to lisinopril 40 (from 20) a couple of days ago and did not take med at all today.  He will continue to monitor at home for a week or so more prior to any further changes   Signed Lamar Blinks, MD

## 2017-04-18 NOTE — Telephone Encounter (Signed)
Patient states his girlfriend was diagnosed with Hep B over weekend.  Last sexual contact was 1 week and a half ago.  Patient currently with no symptoms, but requesting follow up with provider.  Appointment made today with Dr. Lorelei Pont. Answer Assessment - Initial Assessment Questions 1. TYPE of HEPATITIS: "What type of hepatitis was it?"  (e.g., Hepatitis A, B, other, unknown)     B 2. TYPE of CONTACT: "What kind of contact?" (e.g., shared food, sexual contact)      sexual 3. DATE of CONTACT: "When did you have contact with the hepatitis patient?"    04/08/17 4. TYPE of WORK: "What type of work do you do?"      retired 1. SYMPTOMS: "Do you have any symptoms?" (e.g., fever, abdominal pain, vomiting, yellow eyes)     no  Answer Assessment - Initial Assessment Questions 1. SYMPTOMS: "Do you have any symptoms of an STD?" If so, ask: "What are they?"     NO 2. TYPE: "What STD do you have questions about?"     Hepatitis B  3. EXPOSURE: "Were you exposed to an STD?" If so, ask: "When and what kind of exposure?"    Yes, girlfriend positive for Hep B over weekend  Protocols used: HEPATITIS A [place], STD QUESTIONS-A-AH

## 2017-04-19 ENCOUNTER — Encounter: Payer: Self-pay | Admitting: Family Medicine

## 2017-04-19 ENCOUNTER — Telehealth: Payer: Self-pay | Admitting: Family Medicine

## 2017-04-19 ENCOUNTER — Ambulatory Visit: Payer: Federal, State, Local not specified - PPO | Admitting: Family Medicine

## 2017-04-19 LAB — HEPATITIS C ANTIBODY
Hepatitis C Ab: NONREACTIVE
SIGNAL TO CUT-OFF: 0.01 (ref <1.00)

## 2017-04-19 LAB — HEPATITIS B SURFACE ANTIBODY, QUANTITATIVE

## 2017-04-19 LAB — HEPATITIS B CORE ANTIBODY, IGM: Hep B C IgM: NONREACTIVE

## 2017-04-19 LAB — HEPATITIS B CORE ANTIBODY, TOTAL: Hep B Core Total Ab: NONREACTIVE

## 2017-04-19 LAB — HEPATITIS B SURFACE ANTIGEN: Hepatitis B Surface Ag: NONREACTIVE

## 2017-04-19 NOTE — Telephone Encounter (Signed)
Pt was scheduled to come in at 2:15 today to go over his labs from yesterday and start hep B vaccine series.  Per front desk staff report, he appeared at about 3:40 and became angry that we were not able to see him as he missed his appt and left.  I was not aware that he was here, and actually would have made an exception and seen him due to the nature of his concern.  However staff also reported that pt was yelling and being rude, accusing staff of giving him the wrong appointment time and making him be late.  I was actually present yesterday when he was given his appt time and he was told 2:15 per my records and recollection His recent labs are as follows  Results for orders placed or performed in visit on 04/18/17  Hepatitis C antibody  Result Value Ref Range   Hepatitis C Ab NON-REACTIVE NON-REACTI   SIGNAL TO CUT-OFF 0.01 <1.00  Hepatitis B surface antibody  Result Value Ref Range   Hepatitis B-Post <5 (L) > OR = 10 mIU/mL  Hepatitis B surface antigen  Result Value Ref Range   Hepatitis B Surface Ag NON-REACTIVE NON-REACTI  Hepatitis B Core Antibody, IgM  Result Value Ref Range   Hep B C IgM NON-REACTIVE NON-REACTI  Hepatitis B Core Antibody, total  Result Value Ref Range   Hep B Core Total Ab NON-REACTIVE NON-REACTI   Will communicate with him via mychart, please see mychart note:  This is good news- you are negative for Hepatitis C and do not show any sign of hepatitis B infection currently.  However, you are also not immune to hep B which is further evidence that you have not been vaccinated.  I would urge you to get the hep B vaccine series (3 shots, given at 0,1, and 6 months) as soon as you can I would also advise that you be re-tested for hepatitis B and C in 3 and then 6 months.   As I did not see you in the office today, we can arrange for a nurse visit to start your hepatitis series at your earliest convenience if you would like Please let me now how you would like to  proceed

## 2017-04-22 NOTE — Progress Notes (Signed)
[place] at [facility] [address] [place],  [address] [phone]  Date:  04/25/2017   Name:  Gregory Carr   DOB:  03-25-1951   MRN:  [id number]  PCP:  Mosie Lukes, MD    Chief Complaint: Follow-up (Pt here for )   History of Present Illness:  Gregory Carr is a 66 y.o. very pleasant male patient who presents with the following: See note from 12/26- he needs to start the hep B series as he may have been exposed from a sexual contact. We are glad that he has tested negative so far, but will plan to repeat testing in about one month He is also due for a tetanus and would like to update this today as well  He notes that he is now taking 40 mg of lisinopril daily. His BP is still on the high side but not dangerously so  We discussed transmission of hep B.  He thinks that he will not continue his relationship with this partner as it is not worth the risk to him  BP Readings from Last 3 Encounters:  04/25/17 (!) 142/88  04/18/17 (!) 149/87  03/08/17 (!) 133/94    Results for orders placed or performed in visit on 04/18/17  Hepatitis C antibody  Result Value Ref Range   Hepatitis C Ab NON-REACTIVE NON-REACTI   SIGNAL TO CUT-OFF 0.01 <1.00  Hepatitis B surface antibody  Result Value Ref Range   Hepatitis B-Post <5 (L) > OR = 10 mIU/mL  Hepatitis B surface antigen  Result Value Ref Range   Hepatitis B Surface Ag NON-REACTIVE NON-REACTI  Hepatitis B Core Antibody, IgM  Result Value Ref Range   Hep B C IgM NON-REACTIVE NON-REACTI  Hepatitis B Core Antibody, total  Result Value Ref Range   Hep B Core Total Ab NON-REACTIVE NON-REACTI   He is taking 40 mg of lisinopril daily for his BP right now   Patient Active Problem List   Diagnosis Date Noted  . Right hip pain 07/21/2016  . Grief reaction 11/07/2015  . Motion sickness 01/17/2015  . Abdominal pain, left lower quadrant 01/17/2015  . Hyperlipidemia  01/26/2014  . Preventative health care 09/29/2013  . Hallux limitus of left foot 11/08/2012  . Nail deformity 11/08/2012  . Onychomycosis 09/13/2012  . BPH (benign prostatic hyperplasia) 09/13/2012  . Renal cell carcinoma ([place]) 12/15/2011  . Tongue lesion 12/15/2011  . Atypical chest pain 01/11/2011  . FATIGUE 10/04/2009  . MYALGIA 10/12/2008  . 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 GLUCOSE INTOLERANCE 12/11/2007  . OTHER DECREASED WHITE BLOOD CELL COUNT 12/11/2007  . Jaundice, non-neonatal 12/11/2007  . BARRETTS ESOPHAGUS 11/11/2007  . BACK PAIN, CHRONIC 11/11/2007  . Inguinal hernia 05/13/2007  . OTHER ANXIETY STATES 04/08/2007  . APHTHOUS ULCERS 04/08/2007  . Essential hypertension 08/16/2006  . ERECTILE DYSFUNCTION 08/16/2006  . ADD 08/16/2006  . GERD 08/16/2006  . LOW BACK PAIN 08/16/2006  . INSOMNIA 08/16/2006    Past Medical History:  Diagnosis Date  . Abdominal pain, left lower quadrant 01/17/2015  . BACK PAIN, CHRONIC 11/11/2007   Qualifier: Diagnosis of  By: Redmond Pulling MD, Frann Rider    . BARRETTS ESOPHAGUS 11/11/2007   Qualifier: Diagnosis of  By: Redmond Pulling MD, Frann Rider    . BPH (benign prostatic hyperplasia) 09/13/2012  . BPH (benign prostatic hyperplasia) 09/13/2012   Follows with [facility]   . ERECTILE DYSFUNCTION 08/16/2006  Qualifier: Diagnosis of  By: Cletus Gash MD, [place]    . GERD 08/16/2006   Qualifier: Diagnosis of  By: Cletus Gash MD, [place]    . [place] SYNDROME 01/22/2008   Qualifier: Diagnosis of  By: Redmond Pulling MD, Frann Rider    . GLUCOSE INTOLERANCE 12/11/2007   Qualifier: Diagnosis of  By: Redmond Pulling MD, Frann Rider     . Grief reaction 11/07/2015  . HTN (hypertension) 03/24/2011  . HYPERLIPIDEMIA 08/16/2006   Qualifier: Diagnosis of  By: Cletus Gash MD, [place]    . Onychomycosis 09/13/2012  . Other anxiety states 04/08/2007   Qualifier: Diagnosis of  By: Cletus Gash MD, [place]    . Preventative health care 09/29/2013  . Renal cell carcinoma ([place]) 12/15/2011   S/p  left nephrectomy Follows with [facility]   . Right hip pain 07/21/2016    No past surgical history on file.  Social History   Tobacco Use  . Smoking status: Former Smoker    Types: Cigarettes  . Smokeless tobacco: Never Used  Substance Use Topics  . Alcohol use: Yes    Comment: rare  . Drug use: No    Family History  Problem Relation Age of Onset  . Dementia Mother   . Cancer Daughter        brain cancer, glioblastoma  . Hypertension Maternal Grandfather   . Heart disease Maternal Grandfather        MI  . Stroke Paternal Grandfather   . Arthritis Sister     No Known Allergies  Medication list has been reviewed and updated.  Current Outpatient Medications on File Prior to Visit  Medication Sig Dispense Refill  . amphetamine-dextroamphetamine (ADDERALL) 20 MG tablet Take 20 mg by mouth 3 (three) times daily. Reported on 10/28/2015    . atorvastatin (LIPITOR) 80 MG tablet Take 1 tablet (80 mg total) by mouth daily. 90 tablet 1  . Coenzyme Q10 (COQ-10) 400 MG CAPS Take 400 mg by mouth daily. Reported on 10/28/2015    . esomeprazole (NEXIUM) 40 MG capsule TAKE 1 CAPSULE DAILY BEFORE BREAKFAST 90 capsule 1  . Eszopiclone (ESZOPICLONE) 3 MG TABS Take 3 mg by mouth at bedtime. Reported on 10/28/2015    . Fiber CAPS Take by mouth daily. Reported on 10/28/2015    . NUCYNTA ER 100 MG 12 hr tablet Take 2 tablets by mouth daily.    . traZODone (DESYREL) 100 MG tablet Take 200 mg by mouth at bedtime.     No current facility-administered medications on file prior to visit.     Review of Systems:  As per HPI- otherwise negative. No fever or chills Feels fine today  Physical Examination: Vitals:   04/25/17 1153  BP: (!) 142/88  Pulse: 80  SpO2: 100%   Vitals:   04/25/17 1153  Weight: 166 lb 3.2 oz (75.4 kg)  Height: 5\' 5"  (1.651 m)   Body mass index is 27.66 kg/m. Ideal Body Weight: Weight in (lb) to have BMI = 25: 149.9  GEN: WDWN, NAD, Non-toxic, A & O x 3, looks  well, mild overweight  HEENT: Atraumatic, Normocephalic. Neck supple. No masses, No LAD. Ears and Nose: No external deformity. CV: RRR, No M/G/R. No JVD. No thrill. No extra heart sounds. PULM: CTA B, no wheezes, crackles, rhonchi. No retractions. No resp. distress. No accessory muscle use. EXTR: No c/c/e NEURO Normal gait.  PSYCH: Normally interactive. Conversant. Not depressed or anxious appearing.  Calm demeanor.    Assessment and Plan: Exposure to hepatitis B - Plan:  Hepatitis B vaccine adult IM  Immunization due - Plan: Tdap vaccine greater than or equal to 7yo IM  Need for hepatitis B vaccination  Need for Tdap vaccination  Essential hypertension  Here today due to sexual exposure to a partner with Hep B.  He tested negative so far, and wishes to start the hep B immunization series.  We will start this today Updated tdap as well He will see me in 4 weeks for his 2nd hep B shot and repeat labs.  We plan to do HIV testing then as well He will continue his current BP regimen, and we may adjust meds at next visit if need be   Signed Lamar Blinks, MD

## 2017-04-25 ENCOUNTER — Encounter: Payer: Self-pay | Admitting: Family Medicine

## 2017-04-25 ENCOUNTER — Ambulatory Visit: Payer: Federal, State, Local not specified - PPO | Admitting: Family Medicine

## 2017-04-25 VITALS — BP 142/88 | HR 80 | Ht 65.0 in | Wt 166.2 lb

## 2017-04-25 DIAGNOSIS — I10 Essential (primary) hypertension: Secondary | ICD-10-CM

## 2017-04-25 DIAGNOSIS — Z205 Contact with and (suspected) exposure to viral hepatitis: Secondary | ICD-10-CM | POA: Diagnosis not present

## 2017-04-25 DIAGNOSIS — Z23 Encounter for immunization: Secondary | ICD-10-CM

## 2017-04-25 NOTE — Patient Instructions (Signed)
We will give you the first hep B vaccine today- the next is due in one month We also updated your tetanus today  Please see me in 4 weeks to repeat labs and do your next hep B vaccine  Please monitor your BP at home and let me know if you are running higher than 150/90 on a regular basis. Otherwise we can discuss your number at our next visit and adjust medications as needed We will also plan to repeat your lab (hep B and C, HIV) at next visit

## 2017-05-22 NOTE — Progress Notes (Addendum)
[place] at [facility] [address] [place], [place] [address] [phone]  Date:  05/23/2017   Name:  Gregory Carr   DOB:  1951/03/19   MRN:  [id number]  PCP:  Mosie Lukes, MD    Chief Complaint: Follow-up   History of Present Illness:  Gregory Carr is a 67 y.o. very pleasant male patient who presents with the following:  From our visit on 1/2 See note from 12/26- he needs to start the hep B series as he may have been exposed from a sexual contact. We are glad that he has tested negative so far, but will plan to repeat testing in about one month He is also due for a tetanus and would like to update this today as well He notes that he is now taking 40 mg of lisinopril daily. His BP is still on the high side but not dangerously so We discussed transmission of hep B.  He thinks that he will not continue his relationship with this partner as it is not worth the risk to him  He had hep B #1 on 04/25/17- can have 2nd today as it has been 4 weeks  He is still in a relationship with his partner who has hep B but they are using condoms  He has not been regularly following his BP at home, just checked it today- it was 140-153/90s  BP Readings from Last 3 Encounters:  05/23/17 (!) 144/66  04/25/17 (!) 142/88  04/18/17 (!) 149/87   Currently on lisinopril 40 mg a day  Patient Active Problem List   Diagnosis Date Noted  . Right hip pain 07/21/2016  . Grief reaction 11/07/2015  . Motion sickness 01/17/2015  . Abdominal pain, left lower quadrant 01/17/2015  . Hyperlipidemia 01/26/2014  . Preventative health care 09/29/2013  . Hallux limitus of left foot 11/08/2012  . Nail deformity 11/08/2012  . Onychomycosis 09/13/2012  . BPH (benign prostatic hyperplasia) 09/13/2012  . Renal cell carcinoma ([place]) 12/15/2011  . Tongue lesion 12/15/2011  . Atypical chest pain 01/11/2011  . FATIGUE 10/04/2009  . MYALGIA 10/12/2008  .  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 GLUCOSE INTOLERANCE 12/11/2007  . OTHER DECREASED WHITE BLOOD CELL COUNT 12/11/2007  . Jaundice, non-neonatal 12/11/2007  . BARRETTS ESOPHAGUS 11/11/2007  . BACK PAIN, CHRONIC 11/11/2007  . Inguinal hernia 05/13/2007  . OTHER ANXIETY STATES 04/08/2007  . APHTHOUS ULCERS 04/08/2007  . Essential hypertension 08/16/2006  . ERECTILE DYSFUNCTION 08/16/2006  . ADD 08/16/2006  . GERD 08/16/2006  . LOW BACK PAIN 08/16/2006  . INSOMNIA 08/16/2006    Past Medical History:  Diagnosis Date  . Abdominal pain, left lower quadrant 01/17/2015  . BACK PAIN, CHRONIC 11/11/2007   Qualifier: Diagnosis of  By: Redmond Pulling MD, Frann Rider    . BARRETTS ESOPHAGUS 11/11/2007   Qualifier: Diagnosis of  By: Redmond Pulling MD, Frann Rider    . BPH (benign prostatic hyperplasia) 09/13/2012  . BPH (benign prostatic hyperplasia) 09/13/2012   Follows with [facility]   . ERECTILE DYSFUNCTION 08/16/2006   Qualifier: Diagnosis of  By: Cletus Gash MD, [place]    . GERD 08/16/2006   Qualifier: Diagnosis of  By: Cletus Gash MD, [place]    . [place] SYNDROME 01/22/2008   Qualifier: Diagnosis of  By: Redmond Pulling MD, Frann Rider    . GLUCOSE INTOLERANCE 12/11/2007   Qualifier: Diagnosis of  By: Redmond Pulling MD, Frann Rider     . Grief reaction 11/07/2015  .  HTN (hypertension) 03/24/2011  . HYPERLIPIDEMIA 08/16/2006   Qualifier: Diagnosis of  By: Cletus Gash MD, [place]    . Onychomycosis 09/13/2012  . Other anxiety states 04/08/2007   Qualifier: Diagnosis of  By: Cletus Gash MD, [place]    . Preventative health care 09/29/2013  . Renal cell carcinoma ([place]) 12/15/2011   S/p left nephrectomy Follows with [facility]   . Right hip pain 07/21/2016    No past surgical history on file.  Social History   Tobacco Use  . Smoking status: Former Smoker    Types: Cigarettes  . Smokeless tobacco: Never Used  Substance Use Topics  . Alcohol use: Yes    Comment: rare  . Drug use: No    Family History  Problem Relation  Age of Onset  . Dementia Mother   . Cancer Daughter        brain cancer, glioblastoma  . Hypertension Maternal Grandfather   . Heart disease Maternal Grandfather        MI  . Stroke Paternal Grandfather   . Arthritis Sister     No Known Allergies  Medication list has been reviewed and updated.  Current Outpatient Medications on File Prior to Visit  Medication Sig Dispense Refill  . amphetamine-dextroamphetamine (ADDERALL) 20 MG tablet Take 20 mg by mouth 3 (three) times daily. Reported on 10/28/2015    . atorvastatin (LIPITOR) 80 MG tablet Take 1 tablet (80 mg total) by mouth daily. 90 tablet 1  . Coenzyme Q10 (COQ-10) 400 MG CAPS Take 400 mg by mouth daily. Reported on 10/28/2015    . esomeprazole (NEXIUM) 40 MG capsule TAKE 1 CAPSULE DAILY BEFORE BREAKFAST 90 capsule 1  . Eszopiclone (ESZOPICLONE) 3 MG TABS Take 3 mg by mouth at bedtime. Reported on 10/28/2015    . Fiber CAPS Take by mouth daily. Reported on 10/28/2015    . NUCYNTA ER 100 MG 12 hr tablet Take 2 tablets by mouth daily.    . traZODone (DESYREL) 100 MG tablet Take 200 mg by mouth at bedtime.     No current facility-administered medications on file prior to visit.     Review of Systems:  As per HPI- otherwise negative. No fever or chills No jaundice  Feels well    Physical Examination: Vitals:   05/23/17 1148  BP: (!) 144/66  Pulse: 82  Resp: 18  Temp: 97.9 F (36.6 C)  SpO2: 97%   Vitals:   05/23/17 1148  Weight: 161 lb 9.6 oz (73.3 kg)  Height: 5\' 6"  (1.676 m)   Body mass index is 26.08 kg/m. Ideal Body Weight: Weight in (lb) to have BMI = 25: 154.6  GEN: WDWN, NAD, Non-toxic, A & O x 3, looks well  HEENT: Atraumatic, Normocephalic. Neck supple. No masses, No LAD. Ears and Nose: No external deformity. CV: RRR, No M/G/R. No JVD. No thrill. No extra heart sounds. PULM: CTA B, no wheezes, crackles, rhonchi. No retractions. No resp. distress. No accessory muscle use. EXTR: No c/c/e NEURO Normal gait.   PSYCH: Normally interactive. Conversant. Not depressed or anxious appearing.  Calm demeanor.    Assessment and Plan: Exposure to hepatitis B - Plan: Hepatitis B surface antigen, Hepatitis C antibody, HIV antibody, RPR, Hepatitis B vaccine adult IM, CANCELED: Hepatitis B vaccine adult IM  Immunization due  Essential hypertension - Plan: lisinopril-hydrochlorothiazide (PRINZIDE,ZESTORETIC) 20-12.5 MG tablet  Decided to try changing him to lisinopril/ hct for his BP 2nd hep B today Repeat blood work Plan to visit in  5 months for last shot and labs  Will plan further follow- up pending labs. Answered several questions about hep B transmission for him today   Signed Lamar Blinks, MD  Received his labs 1/31- message to pt  Please be sure to come back in 5 months for your last shot and round of tests. If you develop any symptoms concerning for hepatitis (jaundice, abdominal pain or persistent nausea) please let me know  JC  Results for orders placed or performed in visit on 05/23/17  Hepatitis B surface antigen  Result Value Ref Range   Hepatitis B Surface Ag NON-REACTIVE NON-REACTI  Hepatitis C antibody  Result Value Ref Range   Hepatitis C Ab NON-REACTIVE NON-REACTI   SIGNAL TO CUT-OFF 0.01 <1.00  HIV antibody  Result Value Ref Range   HIV 1&2 Ab, 4th Generation NON-REACTIVE NON-REACTI  RPR  Result Value Ref Range   RPR Ser Ql NON-REACTIVE NON-REACTI   Message to pt

## 2017-05-23 ENCOUNTER — Encounter: Payer: Self-pay | Admitting: Family Medicine

## 2017-05-23 ENCOUNTER — Ambulatory Visit: Payer: Federal, State, Local not specified - PPO | Admitting: Family Medicine

## 2017-05-23 VITALS — BP 144/66 | HR 82 | Temp 97.9°F | Resp 18 | Ht 66.0 in | Wt 161.6 lb

## 2017-05-23 DIAGNOSIS — Z23 Encounter for immunization: Secondary | ICD-10-CM | POA: Diagnosis not present

## 2017-05-23 DIAGNOSIS — Z205 Contact with and (suspected) exposure to viral hepatitis: Secondary | ICD-10-CM

## 2017-05-23 DIAGNOSIS — I10 Essential (primary) hypertension: Secondary | ICD-10-CM | POA: Diagnosis not present

## 2017-05-23 MED ORDER — LISINOPRIL-HYDROCHLOROTHIAZIDE 20-12.5 MG PO TABS: 1.0000 | ORAL_TABLET | Freq: Every day | ORAL | 2 refills | Status: DC

## 2017-05-23 NOTE — Patient Instructions (Signed)
You got your 2nd hepatitis B vaccine today- we will plan to visit in 5 months for the last shot. I will check your labs today and will be in touch with your results asap For your blood pressure, we are going to try changing you from plain lisinopril to lisinopril with hydrochlorothiazide (or HCTZ) Take this once a day.   Please check your BP at home a few times and give me an update regarding your readings

## 2017-05-24 ENCOUNTER — Encounter: Payer: Self-pay | Admitting: Family Medicine

## 2017-05-24 LAB — RPR: RPR Ser Ql: NONREACTIVE

## 2017-05-24 LAB — HEPATITIS C ANTIBODY
HEP C AB: NONREACTIVE
SIGNAL TO CUT-OFF: 0.01 (ref <1.00)

## 2017-05-24 LAB — HIV ANTIBODY (ROUTINE TESTING W REFLEX): HIV 1&2 Ab, 4th Generation: NONREACTIVE

## 2017-05-24 LAB — HEPATITIS B SURFACE ANTIGEN: Hepatitis B Surface Ag: NONREACTIVE

## 2017-06-07 ENCOUNTER — Other Ambulatory Visit: Payer: Self-pay | Admitting: Family Medicine

## 2017-07-13 ENCOUNTER — Other Ambulatory Visit: Payer: Self-pay

## 2017-07-13 DIAGNOSIS — I10 Essential (primary) hypertension: Secondary | ICD-10-CM

## 2017-07-13 MED ORDER — LISINOPRIL-HYDROCHLOROTHIAZIDE 20-12.5 MG PO TABS: 1.0000 | ORAL_TABLET | Freq: Every day | ORAL | 2 refills | Status: DC

## 2017-08-21 ENCOUNTER — Other Ambulatory Visit: Payer: Self-pay | Admitting: Physical Medicine and Rehabilitation

## 2017-08-21 DIAGNOSIS — M5416 Radiculopathy, lumbar region: Secondary | ICD-10-CM

## 2017-08-29 ENCOUNTER — Ambulatory Visit
Admission: RE | Admit: 2017-08-29 | Discharge: 2017-08-29 | Disposition: A | Discharge status: Home / Self Care | Payer: Federal, State, Local not specified - PPO | Source: Ambulatory Visit | Attending: Physical Medicine and Rehabilitation | Admitting: Physical Medicine and Rehabilitation

## 2017-08-29 DIAGNOSIS — M5416 Radiculopathy, lumbar region: Secondary | ICD-10-CM

## 2017-09-18 ENCOUNTER — Telehealth: Payer: Self-pay | Admitting: Family Medicine

## 2017-09-18 NOTE — Telephone Encounter (Signed)
Notify patient that the pain people just let us know he had an MRI and they found some cysts on his kidneys the radiologist suggested we order a targeted ultrasound to evaluate more fully. I can order Ultrasound or he can come in and discuss his optiolns.

## 2017-09-18 NOTE — Telephone Encounter (Signed)
Please advise 

## 2017-09-18 NOTE — Telephone Encounter (Signed)
Copied from [place] [id number]. Topic: Quick Communication - See Telephone Encounter >> Sep 18, 2017  3:24 PM Conception Chancy, NT wrote: CRM for notification. See Telephone encounter for: 09/18/17.  Valetta Fuller is calling from [facility] and states that the MRI showed Kidney Cystic Disease and will be faxing over the results. Please advise.   Valetta Fuller [phone]

## 2017-09-19 NOTE — Telephone Encounter (Signed)
Mailbox is full - could not leave message.

## 2017-09-20 ENCOUNTER — Telehealth: Payer: Self-pay | Admitting: *Deleted

## 2017-09-20 NOTE — Telephone Encounter (Signed)
Received MRI Lumbar Spine results from [facility]; forwarded to provider/SLS 05/30

## 2017-09-21 NOTE — Telephone Encounter (Signed)
Can't leave a voicemail

## 2017-09-23 NOTE — Telephone Encounter (Signed)
I guess we should send him a letter with a recommendation regarding his MRI

## 2017-09-25 NOTE — Telephone Encounter (Signed)
Pt. reached out by phone for a third time, mailbox still full. MRI impression and Dr. Frederik Pear message composed in a letter, to be mailed out today for pt. To follow up.

## 2017-10-03 ENCOUNTER — Encounter: Payer: Self-pay | Admitting: Family Medicine

## 2017-10-16 ENCOUNTER — Ambulatory Visit: Payer: Federal, State, Local not specified - PPO

## 2017-10-16 ENCOUNTER — Ambulatory Visit (INDEPENDENT_AMBULATORY_CARE_PROVIDER_SITE_OTHER): Payer: Federal, State, Local not specified - PPO

## 2017-10-16 DIAGNOSIS — Z23 Encounter for immunization: Secondary | ICD-10-CM | POA: Diagnosis not present

## 2017-10-16 MED ORDER — ESOMEPRAZOLE MAGNESIUM 40 MG PO CPDR: DELAYED_RELEASE_CAPSULE | ORAL | 0 refills | Status: DC

## 2017-10-16 NOTE — Progress Notes (Signed)
Pre visit review using our clinic review tool, if applicable. No additional management support is needed unless otherwise documented below in the visit note.  Patient here today for Hep B shot. Patient received vaccine in left deltoid Tolerated well.

## 2017-10-22 ENCOUNTER — Ambulatory Visit: Payer: Federal, State, Local not specified - PPO | Admitting: Family Medicine

## 2017-10-29 ENCOUNTER — Ambulatory Visit: Payer: Federal, State, Local not specified - PPO | Admitting: Family Medicine

## 2018-01-12 ENCOUNTER — Other Ambulatory Visit: Payer: Self-pay | Admitting: Family Medicine

## 2018-02-07 ENCOUNTER — Ambulatory Visit: Payer: Self-pay | Admitting: Psychiatry

## 2018-02-08 ENCOUNTER — Encounter: Payer: Self-pay | Admitting: Psychiatry

## 2018-02-08 ENCOUNTER — Ambulatory Visit: Payer: Federal, State, Local not specified - PPO | Admitting: Psychiatry

## 2018-02-08 VITALS — BP 136/92 | HR 81

## 2018-02-08 DIAGNOSIS — F902 Attention-deficit hyperactivity disorder, combined type: Secondary | ICD-10-CM | POA: Diagnosis not present

## 2018-02-08 MED ORDER — AMPHETAMINE-DEXTROAMPHETAMINE 20 MG PO TABS: 20.0000 mg | ORAL_TABLET | Freq: Three times a day (TID) | ORAL | 0 refills | Status: DC

## 2018-02-08 MED ORDER — TRAZODONE HCL 100 MG PO TABS: 200.0000 mg | ORAL_TABLET | Freq: Every day | ORAL | 3 refills | Status: DC

## 2018-02-08 MED ORDER — SUVOREXANT 20 MG PO TABS: 20.0000 mg | ORAL_TABLET | Freq: Every day | ORAL | 2 refills | Status: DC

## 2018-02-08 NOTE — Progress Notes (Addendum)
Problems with computer. Also on morphine. Increased belsomra to 20mg [name]     [facility]  Patient ID: Gregory Carr,  MRN: [id number]  PCP: Mosie Lukes, MD  Date of Evaluation: 02/08/2018 Time spent:20 minutes   HISTORY/CURRENT STATUS: HPI patient is a 67 year old white male last seen 11/16/2017.  Diagnosis of ADHD generalized anxiety disorder and chronic insomnia.  Was doing well at his last visit.  We decided to taper him off the Lunesta.  He was started on Belsomra 10 mg at bedtime.  He was continued on the Adderall  He has continued to do well.  Individual Medical History/ Review of Systems: Changes? :No  Allergies: Patient has no known allergies.  Current Medications:  Current Outpatient Medications:  .  amphetamine-dextroamphetamine (ADDERALL) 20 MG tablet, Take 20 mg by mouth 3 (three) times daily. Reported on 10/28/2015, Disp: , Rfl:  .  atorvastatin (LIPITOR) 80 MG tablet, Take 1 tablet (80 mg total) by mouth daily., Disp: 90 tablet, Rfl: 1 .  Coenzyme Q10 (COQ-10) 400 MG CAPS, Take 400 mg by mouth daily. Reported on 10/28/2015, Disp: , Rfl:  .  esomeprazole (NEXIUM) 40 MG capsule, Take 1 capsule (40 mg total) by mouth daily before breakfast., Disp: 90 capsule, Rfl: 0 .  Eszopiclone (ESZOPICLONE) 3 MG TABS, Take 3 mg by mouth at bedtime. Reported on 10/28/2015, Disp: , Rfl:  .  Fiber CAPS, Take by mouth daily. Reported on 10/28/2015, Disp: , Rfl:  .  lisinopril-hydrochlorothiazide (PRINZIDE,ZESTORETIC) 20-12.5 MG tablet, Take 1 tablet by mouth daily., Disp: 90 tablet, Rfl: 2 .  NUCYNTA ER 100 MG 12 hr tablet, Take 2 tablets by mouth daily., Disp: , Rfl:  .  traZODone (DESYREL) 100 MG tablet, Take 200 mg by mouth at bedtime., Disp: , Rfl:  Medication Side Effects: None  Family Medical/ Social History: Changes? No  MENTAL HEALTH EXAM:  There were no vitals taken for this visit.There is no height or weight on file to calculate BMI.  General Appearance: Casual  Eye  Contact:  Good  Speech:  Normal Rate  Volume:  Normal  Mood:  Euthymic  Affect:  Appropriate  Thought Process:  Linear  Orientation:  Full (Time, Place, and Person)  Thought Content: Logical   Suicidal Thoughts:  No  Homicidal Thoughts:  No  Memory:  normal  Judgement:  Intact  Insight:  Good  Psychomotor Activity:  Normal  Concentration:  Concentration: Good  Recall:  Good  Fund of Knowledge: Good  Language: good  Akathisia:  NA  AIMS (if indicated): na  Assets:  Desire for Improvement  ADL's:  Intact  Cognition: WNL  Prognosis:  Good    DIAGNOSES: No diagnosis found.  RECOMMENDATIONS:    Comer Locket, PA-C

## 2018-02-28 ENCOUNTER — Telehealth: Payer: Self-pay | Admitting: Psychiatry

## 2018-03-01 NOTE — Telephone Encounter (Signed)
No note

## 2018-04-04 ENCOUNTER — Other Ambulatory Visit: Payer: Self-pay | Admitting: Family Medicine

## 2018-04-08 ENCOUNTER — Telehealth: Payer: Self-pay | Admitting: Psychiatry

## 2018-04-08 ENCOUNTER — Other Ambulatory Visit: Payer: Self-pay | Admitting: Psychiatry

## 2018-04-08 MED ORDER — AMPHETAMINE-DEXTROAMPHETAMINE 20 MG PO TABS: 20.0000 mg | ORAL_TABLET | Freq: Three times a day (TID) | ORAL | 0 refills | Status: DC

## 2018-04-08 NOTE — Telephone Encounter (Signed)
Needs rx for adderall called in

## 2018-04-08 NOTE — Telephone Encounter (Signed)
No note

## 2018-04-08 NOTE — Progress Notes (Signed)
At next conversation, will discuss all his meds including nucynta and morphine, belsomra

## 2018-04-08 NOTE — Telephone Encounter (Signed)
adderall 20mg  tid,   #90 Traci. [;ease call in, I'm not sure how to eprescribe on msg you sent to me. Thanks Jameya Pontiff

## 2018-04-14 ENCOUNTER — Other Ambulatory Visit: Payer: Self-pay | Admitting: Family Medicine

## 2018-04-15 MED ORDER — ESOMEPRAZOLE MAGNESIUM 40 MG PO CPDR: 40.0000 mg | DELAYED_RELEASE_CAPSULE | Freq: Every day | ORAL | 0 refills | Status: DC

## 2018-05-06 ENCOUNTER — Telehealth: Payer: Self-pay | Admitting: Psychiatry

## 2018-05-06 ENCOUNTER — Other Ambulatory Visit: Payer: Self-pay | Admitting: Psychiatry

## 2018-05-06 ENCOUNTER — Other Ambulatory Visit: Payer: Self-pay | Admitting: Family Medicine

## 2018-05-06 MED ORDER — AMPHETAMINE-DEXTROAMPHETAMINE 20 MG PO TABS: 20.0000 mg | ORAL_TABLET | Freq: Three times a day (TID) | ORAL | 0 refills | Status: DC

## 2018-05-06 NOTE — Telephone Encounter (Signed)
Patient requesting refill on Adderall 20mg . Please send to the [facility] on [address].

## 2018-05-06 NOTE — Telephone Encounter (Signed)
Last fill 12/16  Please escribe

## 2018-05-09 ENCOUNTER — Encounter: Payer: Self-pay | Admitting: Family Medicine

## 2018-05-09 ENCOUNTER — Ambulatory Visit: Payer: Federal, State, Local not specified - PPO | Admitting: Family Medicine

## 2018-05-09 VITALS — BP 124/68 | HR 74 | Temp 97.9°F | Resp 18 | Ht 64.0 in | Wt 151.0 lb

## 2018-05-09 DIAGNOSIS — K429 Umbilical hernia without obstruction or gangrene: Secondary | ICD-10-CM

## 2018-05-09 DIAGNOSIS — Z0000 Encounter for general adult medical examination without abnormal findings: Secondary | ICD-10-CM | POA: Diagnosis not present

## 2018-05-09 DIAGNOSIS — R109 Unspecified abdominal pain: Secondary | ICD-10-CM

## 2018-05-09 DIAGNOSIS — Z23 Encounter for immunization: Secondary | ICD-10-CM

## 2018-05-09 DIAGNOSIS — I10 Essential (primary) hypertension: Secondary | ICD-10-CM | POA: Diagnosis not present

## 2018-05-09 DIAGNOSIS — E782 Mixed hyperlipidemia: Secondary | ICD-10-CM

## 2018-05-09 DIAGNOSIS — R252 Cramp and spasm: Secondary | ICD-10-CM

## 2018-05-09 LAB — LIPID PANEL
CHOL/HDL RATIO: 2
Cholesterol: 158 mg/dL (ref 0–200)
HDL: 64.9 mg/dL (ref >39.00)
LDL Cholesterol: 73 mg/dL (ref 0–99)
NonHDL: 93.41
Triglycerides: 101 mg/dL (ref 0.0–149.0)
VLDL: 20.2 mg/dL (ref 0.0–40.0)

## 2018-05-09 LAB — COMPREHENSIVE METABOLIC PANEL
ALBUMIN: 4.2 g/dL (ref 3.5–5.2)
ALK PHOS: 71 U/L (ref 39–117)
ALT: 28 U/L (ref 0–53)
AST: 21 U/L (ref 0–37)
BILIRUBIN TOTAL: 0.8 mg/dL (ref 0.2–1.2)
BUN: 21 mg/dL (ref 6–23)
CO2: 33 mEq/L — ABNORMAL HIGH (ref 19–32)
CREATININE: 0.96 mg/dL (ref 0.40–1.50)
Calcium: 9.8 mg/dL (ref 8.4–10.5)
Chloride: 103 mEq/L (ref 96–112)
GFR: 78.35 mL/min (ref >60.00)
Glucose, Bld: 105 mg/dL — ABNORMAL HIGH (ref 70–99)
Potassium: 4.7 mEq/L (ref 3.5–5.1)
Sodium: 141 mEq/L (ref 135–145)
Total Protein: 6.6 g/dL (ref 6.0–8.3)

## 2018-05-09 LAB — CBC
HCT: 43 % (ref 39.0–52.0)
Hemoglobin: 14.7 g/dL (ref 13.0–17.0)
MCHC: 34.1 g/dL (ref 30.0–36.0)
MCV: 91.4 fl (ref 78.0–100.0)
PLATELETS: 197 10*3/uL (ref 150.0–400.0)
RBC: 4.71 Mil/uL (ref 4.22–5.81)
RDW: 13.4 % (ref 11.5–15.5)
WBC: 5.9 10*3/uL (ref 4.0–10.5)

## 2018-05-09 LAB — MAGNESIUM: Magnesium: 1.9 mg/dL (ref 1.5–2.5)

## 2018-05-09 LAB — HEMOGLOBIN A1C: Hgb A1c MFr Bld: 5.9 % (ref 4.6–6.5)

## 2018-05-09 LAB — TSH: TSH: 1.44 u[IU]/mL (ref 0.35–4.50)

## 2018-05-09 MED ORDER — LISINOPRIL-HYDROCHLOROTHIAZIDE 20-12.5 MG PO TABS: 1.0000 | ORAL_TABLET | Freq: Every day | ORAL | 2 refills | Status: DC

## 2018-05-09 MED ORDER — ATORVASTATIN CALCIUM 80 MG PO TABS: 80.0000 mg | ORAL_TABLET | Freq: Every day | ORAL | 1 refills | Status: DC

## 2018-05-09 NOTE — Progress Notes (Signed)
Subjective:     Patient ID: Gregory Carr, male    DOB: [birth year], 68 y.o.   MRN: [id number]  No chief complaint on file.   HPI  Patient is in today for follow up of several chronic conditions including hyperlipidemia and hypertension. He is also complaining of some orthostatic dizziness that has been happening over the last few years. He has chronic dry eyes and some intermittent erythema as well as pulsating behind the eyes. Pt has intermittent vague abdominal pain many nights and after some meals. He has tried treating with probiotics which tends to help temporarily but has not solved the issue. He also complains of an abdominal mass that hasn't caused any discomfort but is consistent with an umbilical hernia. Pt complains of some recent slowly developing hearing loss as well. He also complains of recent leg cramps at night.  Patient Care Team: Mosie Lukes, MD as PCP - General (Family Medicine) Franchot Gallo, MD as Consulting Physician (Urology)   Past Medical History:  Diagnosis Date  . Abdominal pain, left lower quadrant 01/17/2015  . BACK PAIN, CHRONIC 11/11/2007   Qualifier: Diagnosis of  By: Redmond Pulling MD, Frann Rider    . BARRETTS ESOPHAGUS 11/11/2007   Qualifier: Diagnosis of  By: Redmond Pulling MD, Frann Rider    . BPH (benign prostatic hyperplasia) 09/13/2012  . BPH (benign prostatic hyperplasia) 09/13/2012   Follows with [facility]   . ERECTILE DYSFUNCTION 08/16/2006   Qualifier: Diagnosis of  By: Cletus Gash MD, [place]    . GERD 08/16/2006   Qualifier: Diagnosis of  By: Cletus Gash MD, [place]    . [place] SYNDROME 01/22/2008   Qualifier: Diagnosis of  By: Redmond Pulling MD, Frann Rider    . GLUCOSE INTOLERANCE 12/11/2007   Qualifier: Diagnosis of  By: Redmond Pulling MD, Frann Rider     . Grief reaction 11/07/2015  . HTN (hypertension) 03/24/2011  . HYPERLIPIDEMIA 08/16/2006   Qualifier: Diagnosis of  By: Cletus Gash MD, [place]    . Onychomycosis 09/13/2012  . Other anxiety states 04/08/2007   Qualifier:  Diagnosis of  By: Cletus Gash MD, [place]    . Preventative health care 09/29/2013  . Renal cell carcinoma ([place]) 12/15/2011   S/p left nephrectomy Follows with [facility]   . Right hip pain 07/21/2016    No past surgical history on file.  Family History  Problem Relation Age of Onset  . Dementia Mother   . Cancer Daughter        brain cancer, glioblastoma  . Hypertension Maternal Grandfather   . Heart disease Maternal Grandfather        MI  . Stroke Paternal Grandfather   . Arthritis Sister     Social History   Socioeconomic History  . Marital status: Widowed    Spouse name: Not on file  . Number of children: Not on file  . Years of education: Not on file  . Highest education level: Not on file  Occupational History  . Not on file  Social Needs  . Financial resource strain: Not on file  . Food insecurity:    Worry: Not on file    Inability: Not on file  . Transportation needs:    Medical: Not on file    Non-medical: Not on file  Tobacco Use  . Smoking status: Former Smoker    Types: Cigarettes  . Smokeless tobacco: Never Used  Substance and Sexual Activity  . Alcohol use: Yes    Comment: rare  . Drug use: No  . Sexual  activity: Yes    Comment: lives with wife, no dietary restrictions, regular exercise  Lifestyle  . Physical activity:    Days per week: Not on file    Minutes per session: Not on file  . Stress: Not on file  Relationships  . Social connections:    Talks on phone: Not on file    Gets together: Not on file    Attends religious service: Not on file    Active member of club or organization: Not on file    Attends meetings of clubs or organizations: Not on file    Relationship status: Not on file  . Intimate partner violence:    Fear of current or ex partner: Not on file    Emotionally abused: Not on file    Physically abused: Not on file    Forced sexual activity: Not on file  Other Topics Concern  . Not on file  Social History Narrative  .  Not on file    Outpatient Medications Prior to Visit  Medication Sig Dispense Refill  . amphetamine-dextroamphetamine (ADDERALL) 20 MG tablet Take 1 tablet (20 mg total) by mouth 3 (three) times daily. Reported on 10/28/2015 90 tablet 0  . atorvastatin (LIPITOR) 80 MG tablet TAKE 1 TABLET BY MOUTH EVERY DAY 90 tablet 0  . Coenzyme Q10 (COQ-10) 400 MG CAPS Take 400 mg by mouth daily. Reported on 10/28/2015    . esomeprazole (NEXIUM) 40 MG capsule Take 1 capsule (40 mg total) by mouth daily before breakfast. 90 capsule 0  . Fiber CAPS Take by mouth daily. Reported on 10/28/2015    . lisinopril-hydrochlorothiazide (PRINZIDE,ZESTORETIC) 20-12.5 MG tablet Take 1 tablet by mouth daily. 90 tablet 2  . NUCYNTA ER 100 MG 12 hr tablet Take 2 tablets by mouth daily.    . Suvorexant (BELSOMRA) 20 MG TABS Take 20 mg by mouth at bedtime. 30 tablet 2  . traZODone (DESYREL) 100 MG tablet Take 2 tablets (200 mg total) by mouth at bedtime. 60 tablet 3   No facility-administered medications prior to visit.     No Known Allergies  Review of Systems  Constitutional: Negative for chills, fever, malaise/fatigue and weight loss.  HENT: Positive for hearing loss (slight decrease in hearing slowly over the years). Negative for congestion, ear pain and sore throat.   Eyes: Positive for redness. Negative for blurred vision.       Sometimes has pulsating behind the eyes  Respiratory: Negative for shortness of breath.   Cardiovascular: Negative for chest pain and palpitations.  Gastrointestinal: Negative for abdominal pain, blood in stool, constipation, diarrhea and heartburn.  Neurological: Positive for dizziness (orthostatic dizziness upon standing quickly).       Objective:    Physical Exam Constitutional:      Appearance: Normal appearance. He is normal weight.  HENT:     Head: Normocephalic and atraumatic.     Right Ear: Tympanic membrane normal.     Left Ear: Tympanic membrane normal.     Nose: Nose  normal.     Mouth/Throat:     Mouth: Mucous membranes are moist.     Pharynx: Oropharynx is clear.  Eyes:     Conjunctiva/sclera: Conjunctivae normal.  Neck:     Musculoskeletal: Normal range of motion and neck supple. No muscular tenderness.  Cardiovascular:     Rate and Rhythm: Normal rate and regular rhythm.     Heart sounds: Normal heart sounds.  Pulmonary:     Breath sounds: Normal  breath sounds.  Abdominal:     General: Bowel sounds are normal.     Palpations: Abdomen is soft.  Neurological:     Mental Status: He is alert and oriented to person, place, and time.  Psychiatric:        Mood and Affect: Mood normal.        Behavior: Behavior normal.     BP 124/68 (BP Location: Left Arm, Patient Position: Sitting, Cuff Size: Normal)   Pulse 74   Temp 97.9 F (36.6 C) (Oral)   Resp 18   Ht 5\' 4"  (1.626 m)   Wt 68.5 kg   SpO2 98%   BMI 25.92 kg/m  Wt Readings from Last 3 Encounters:  05/09/18 68.5 kg  05/23/17 73.3 kg  04/25/17 75.4 kg   BP Readings from Last 3 Encounters:  05/09/18 124/68  05/23/17 (!) 144/66  04/25/17 (!) 142/88     Immunization History  Administered Date(s) Administered  . H1N1 05/18/2008  . Hepatitis B, adult 04/25/2017, 05/23/2017, 10/16/2017  . Influenza Whole 04/02/2007, 01/20/2008, 01/22/2009, 03/08/2010  . Influenza,inj,Quad PF,6+ Mos 01/26/2014, 01/12/2015, 02/12/2017  . Pneumococcal Conjugate-13 04/08/2013  . Td 12/10/2006  . Tdap 04/25/2017    Health Maintenance  Topic Date Due  . PNA vac Low Risk Adult (2 of 2 - PPSV23) 04/21/2017  . COLONOSCOPY  04/25/2019  . TETANUS/TDAP  04/26/2027  . INFLUENZA VACCINE  Completed  . Hepatitis C Screening  Completed    Lab Results  Component Value Date   WBC 8.5 03/08/2017   HGB 15.1 03/08/2017   HCT 44.0 03/08/2017   PLT 195.0 03/08/2017   GLUCOSE 91 03/08/2017   CHOL 143 03/08/2017   TRIG 134.0 03/08/2017   HDL 49.90 03/08/2017   LDLDIRECT 82.0 07/31/2014   LDLCALC 67  03/08/2017   ALT 25 03/08/2017   AST 24 03/08/2017   NA 142 03/08/2017   K 4.6 03/08/2017   CL 104 03/08/2017   CREATININE 0.98 03/08/2017   BUN 17 03/08/2017   CO2 29 03/08/2017   TSH 2.23 03/08/2017   PSA 4.37 (H) 07/31/2014   HGBA1C 6.0 07/17/2016    Lab Results  Component Value Date   TSH 2.23 03/08/2017   Lab Results  Component Value Date   WBC 8.5 03/08/2017   HGB 15.1 03/08/2017   HCT 44.0 03/08/2017   MCV 91.9 03/08/2017   PLT 195.0 03/08/2017   Lab Results  Component Value Date   NA 142 03/08/2017   K 4.6 03/08/2017   CO2 29 03/08/2017   GLUCOSE 91 03/08/2017   BUN 17 03/08/2017   CREATININE 0.98 03/08/2017   BILITOT 1.4 (H) 03/08/2017   ALKPHOS 71 03/08/2017   AST 24 03/08/2017   ALT 25 03/08/2017   PROT 6.7 03/08/2017   ALBUMIN 4.3 03/08/2017   CALCIUM 9.6 03/08/2017   GFR 81.62 03/08/2017   Lab Results  Component Value Date   CHOL 143 03/08/2017   Lab Results  Component Value Date   HDL 49.90 03/08/2017   Lab Results  Component Value Date   LDLCALC 67 03/08/2017   Lab Results  Component Value Date   TRIG 134.0 03/08/2017   Lab Results  Component Value Date   CHOLHDL 3 03/08/2017   Lab Results  Component Value Date   HGBA1C 6.0 07/17/2016         Assessment & Plan:   Problem List Items Addressed This Visit      Cardiovascular and Mediastinum   Essential  hypertension     Other   Preventative health care   Hyperlipidemia - Primary     Problems Addressed This Visit  Essential Hypertension: BP reading appropriate today. Pt states at home readings have been 120/80-140/90. Increase exercise as tolerate and encouraged healthy heart diet. Pt complains of some orthostatic dizziness but is not having issues with low blood pressure readings. Pt was encouraged to hydrate 60-80 oz water per day and to stand up more slowly and intentionally. - Orders: CBC, CMP, TSH  Leg Cramps: Pt complaining of leg cramps during the night almost  daily. Encouraged hydration 60-80 oz daily and Hyland's Leg cramp medicine PRN. - Orders: Magnesium, CMP  Preventative Health Care: Pt is doing well overall. Encouraged heart healthy diet and increase exercise as tolerated. Encouraged pt to get Shingrix vaccine soon, 2 shots over 2-6 months. A1c was 6.0 one year prior so a repeat today. Recent hearing loss has also been a concern for him and he was encouraged to go to [facility] to get a low cost hearing test. - Orders: HgB A1c  Hyperlipidemia: Most recent lab results were appropriate, but it has been over a year and a repeat is needed. Encouraged heart healthy diet and increase exercise as tolerated. - Order: Lipid profile  Abdominal Pain: Vague abdominal pain that has persisted intermittently for several years. No issues with diarrhea or constipation, and no specific pattern. Umbilical hernia was palpated upon exam.  - Order: Abdominal US  I am having Duquan Gillooly. Remick "[name]" maintain his CoQ-10, Fiber, NUCYNTA ER, lisinopril-hydrochlorothiazide, Suvorexant, traZODone, esomeprazole, atorvastatin, and amphetamine-dextroamphetamine.  No orders of the defined types were placed in this encounter.    Court Joy, Student-PA   Patient seen with and examined with student.  Agree with documentation See separate note for further documentation

## 2018-05-09 NOTE — Patient Instructions (Addendum)
Shingrix is the new shingles vaccine, 2 shot over 2-6 months. You can get it at the pharmacy.  [facility] is the best place for a quality    Hyland's leg cramp medicine  Hydrate better 60 to 80 ounces of non caffeine and not alcohol per day.  Use hydrating eye drops   Top BP 100-140 Bottom BP 63-87 Umbilical Hernia, Adult  A hernia is a bulge of tissue that pushes through an opening between muscles. An umbilical hernia happens in the abdomen, near the belly button (umbilicus). The hernia may contain tissues from the small intestine, large intestine, or fatty tissue covering the intestines (omentum). Umbilical hernias in adults tend to get worse over time, and they require surgical treatment. There are several types of umbilical hernias. You may have:  A hernia located just above or below the umbilicus (indirect hernia). This is the most common type of umbilical hernia in adults.  A hernia that forms through an opening formed by the umbilicus (direct hernia).  A hernia that comes and goes (reducible hernia). A reducible hernia may be visible only when you strain, lift something heavy, or cough. This type of hernia can be pushed back into the abdomen (reduced).  A hernia that traps abdominal tissue inside the hernia (incarcerated hernia). This type of hernia cannot be reduced.  A hernia that cuts off blood flow to the tissues inside the hernia (strangulated hernia). The tissues can start to die if this happens. This type of hernia requires emergency treatment. What are the causes? An umbilical hernia happens when tissue inside the abdomen presses on a weak area of the abdominal muscles. What increases the risk? You may have a greater risk of this condition if you:  Are obese.  Have had several pregnancies.  Have a buildup of fluid inside your abdomen (ascites).  Have had surgery that weakens the abdominal muscles. What are the signs or symptoms? The main symptom of this condition is a  painless bulge at or near the belly button. A reducible hernia may be visible only when you strain, lift something heavy, or cough. Other symptoms may include:  Dull pain.  A feeling of pressure. Symptoms of a strangulated hernia may include:  Pain that gets increasingly worse.  Nausea and vomiting.  Pain when pressing on the hernia.  Skin over the hernia becoming red or purple.  Constipation.  Blood in the stool. How is this diagnosed? This condition may be diagnosed based on:  A physical exam. You may be asked to cough or strain while standing. These actions increase the pressure inside your abdomen and force the hernia through the opening in your muscles. Your health care provider may try to reduce the hernia by pressing on it.  Your symptoms and medical history. How is this treated? Surgery is the only treatment for an umbilical hernia. Surgery for a strangulated hernia is done as soon as possible. If you have a small hernia that is not incarcerated, you may need to lose weight before having surgery. Follow these instructions at home:  Lose weight, if told by your health care provider.  Do not try to push the hernia back in.  Watch your hernia for any changes in color or size. Tell your health care provider if any changes occur.  You may need to avoid activities that increase pressure on your hernia.  Do not lift anything that is heavier than 10 lb (4.5 kg) until your health care provider says that this is safe.  Take over-the-counter and prescription medicines only as told by your health care provider.  Keep all follow-up visits as told by your health care provider. This is important. Contact a health care provider if:  Your hernia gets larger.  Your hernia becomes painful. Get help right away if:  You develop sudden, severe pain near the area of your hernia.  You have pain as well as nausea or vomiting.  You have pain and the skin over your hernia changes  color.  You develop a fever. This information is not intended to replace advice given to you by your health care provider. Make sure you discuss any questions you have with your health care provider. Document Released: 09/10/2015 Document Revised: 05/23/2017 Document Reviewed: 10/09/2016 Elsevier Interactive Patient Education  2019 [place] 65 Years and Older, Male Preventive care refers to lifestyle choices and visits with your health care provider that can promote health and wellness. What does preventive care include?   A yearly physical exam. This is also called an annual well check.  Dental exams once or twice a year.  Routine eye exams. Ask your health care provider how often you should have your eyes checked.  Personal lifestyle choices, including: ? Daily care of your teeth and gums. ? Regular physical activity. ? Eating a healthy diet. ? Avoiding tobacco and drug use. ? Limiting alcohol use. ? Practicing safe sex. ? Taking low doses of aspirin every day. ? Taking vitamin and mineral supplements as recommended by your health care provider. What happens during an annual well check? The services and screenings done by your health care provider during your annual well check will depend on your age, overall health, lifestyle risk factors, and family history of disease. Counseling Your health care provider may ask you questions about your:  Alcohol use.  Tobacco use.  Drug use.  Emotional well-being.  Home and relationship well-being.  Sexual activity.  Eating habits.  History of falls.  Memory and ability to understand (cognition).  Work and work Statistician. Screening You may have the following tests or measurements:  Height, weight, and BMI.  Blood pressure.  Lipid and cholesterol levels. These may be checked every 5 years, or more frequently if you are over 52 years old.  Skin check.  Lung cancer screening. You may have  this screening every year starting at age 50 if you have a 30-pack-year history of smoking and currently smoke or have quit within the past 15 years.  Colorectal cancer screening. All adults should have this screening starting at age 56 and continuing until age 27. You will have tests every 1-10 years, depending on your results and the type of screening test. People at increased risk should start screening at an earlier age. Screening tests may include: ? Guaiac-based fecal occult blood testing. ? Fecal immunochemical test (FIT). ? Stool DNA test. ? Virtual colonoscopy. ? Sigmoidoscopy. During this test, a flexible tube with a tiny camera (sigmoidoscope) is used to examine your rectum and lower colon. The sigmoidoscope is inserted through your anus into your rectum and lower colon. ? Colonoscopy. During this test, a long, thin, flexible tube with a tiny camera (colonoscope) is used to examine your entire colon and rectum.  Prostate cancer screening. Recommendations will vary depending on your family history and other risks.  Hepatitis C blood test.  Hepatitis B blood test.  Sexually transmitted disease (STD) testing.  Diabetes screening. This is done by checking your blood sugar (  glucose) after you have not eaten for a while (fasting). You may have this done every 1-3 years.  Abdominal aortic aneurysm (AAA) screening. You may need this if you are a current or former smoker.  Osteoporosis. You may be screened starting at age 69 if you are at high risk. Talk with your health care provider about your test results, treatment options, and if necessary, the need for more tests. Vaccines Your health care provider may recommend certain vaccines, such as:  Influenza vaccine. This is recommended every year.  Tetanus, diphtheria, and acellular pertussis (Tdap, Td) vaccine. You may need a Td booster every 10 years.  Varicella vaccine. You may need this if you have not been vaccinated.  Zoster  vaccine. You may need this after age 63.  Measles, mumps, and rubella (MMR) vaccine. You may need at least one dose of MMR if you were born in 1957 or later. You may also need a second dose.  Pneumococcal 13-valent conjugate (PCV13) vaccine. One dose is recommended after age 56.  Pneumococcal polysaccharide (PPSV23) vaccine. One dose is recommended after age 81.  Meningococcal vaccine. You may need this if you have certain conditions.  Hepatitis A vaccine. You may need this if you have certain conditions or if you travel or work in places where you may be exposed to hepatitis A.  Hepatitis B vaccine. You may need this if you have certain conditions or if you travel or work in places where you may be exposed to hepatitis B.  Haemophilus influenzae type b (Hib) vaccine. You may need this if you have certain risk factors. Talk to your health care provider about which screenings and vaccines you need and how often you need them. This information is not intended to replace advice given to you by your health care provider. Make sure you discuss any questions you have with your health care provider. Document Released: 05/07/2015 Document Revised: 05/31/2017 Document Reviewed: 02/09/2015 Elsevier Interactive Patient Education  2019 [employer].

## 2018-05-13 DIAGNOSIS — R109 Unspecified abdominal pain: Secondary | ICD-10-CM | POA: Insufficient documentation

## 2018-05-13 DIAGNOSIS — R252 Cramp and spasm: Secondary | ICD-10-CM | POA: Insufficient documentation

## 2018-05-13 DIAGNOSIS — K429 Umbilical hernia without obstruction or gangrene: Secondary | ICD-10-CM | POA: Insufficient documentation

## 2018-05-13 NOTE — Assessment & Plan Note (Signed)
Hydrate, check Magnesium, cmp. Try Hyland's leg cramp med. prn

## 2018-05-13 NOTE — Assessment & Plan Note (Signed)
Mild abdominal pain but no change in bowel habits. Will proceed with abdominal ultrasound.

## 2018-05-13 NOTE — Assessment & Plan Note (Signed)
Encouraged heart healthy diet, increase exercise, avoid trans fats, consider a krill oil cap daily. Tolerating Atorvastatin 

## 2018-05-13 NOTE — Progress Notes (Signed)
Subjective:    Patient ID: Gregory Carr, male    DOB: 1950/07/19, 68 y.o.   MRN: [id number]  No chief complaint on file.   HPI Patient is in today for follow-up.  He needs refills on his medications.  He does offer a couple of concerns.  He notes slow gradual hearing loss bilaterally which he thinks is age related. No tinnitus. Visual changes nd red eyes also noted. Has not responded well to his glaucoma treatments. Denies CP/palp/SOB/HA/congestion/fevers/GI or GU c/o. Taking meds as prescribed  Past Medical History:  Diagnosis Date  . Abdominal pain, left lower quadrant 01/17/2015  . BACK PAIN, CHRONIC 11/11/2007   Qualifier: Diagnosis of  By: Redmond Pulling MD, Frann Rider    . BARRETTS ESOPHAGUS 11/11/2007   Qualifier: Diagnosis of  By: Redmond Pulling MD, Frann Rider    . BPH (benign prostatic hyperplasia) 09/13/2012  . BPH (benign prostatic hyperplasia) 09/13/2012   Follows with [facility]   . ERECTILE DYSFUNCTION 08/16/2006   Qualifier: Diagnosis of  By: Cletus Gash MD, [place]    . GERD 08/16/2006   Qualifier: Diagnosis of  By: Cletus Gash MD, [place]    . [place] SYNDROME 01/22/2008   Qualifier: Diagnosis of  By: Redmond Pulling MD, Frann Rider    . GLUCOSE INTOLERANCE 12/11/2007   Qualifier: Diagnosis of  By: Redmond Pulling MD, Frann Rider     . Grief reaction 11/07/2015  . HTN (hypertension) 03/24/2011  . HYPERLIPIDEMIA 08/16/2006   Qualifier: Diagnosis of  By: Cletus Gash MD, [place]    . Onychomycosis 09/13/2012  . Other anxiety states 04/08/2007   Qualifier: Diagnosis of  By: Cletus Gash MD, [place]    . Preventative health care 09/29/2013  . Renal cell carcinoma ([place]) 12/15/2011   S/p left nephrectomy Follows with [facility]   . Right hip pain 07/21/2016    No past surgical history on file.  Family History  Problem Relation Age of Onset  . Dementia Mother   . Cancer Daughter        brain cancer, glioblastoma  . Hypertension Maternal Grandfather   . Heart disease Maternal Grandfather        MI  . Stroke Paternal  Grandfather   . Arthritis Sister     Social History   Socioeconomic History  . Marital status: Widowed    Spouse name: Not on file  . Number of children: Not on file  . Years of education: Not on file  . Highest education level: Not on file  Occupational History  . Not on file  Social Needs  . Financial resource strain: Not on file  . Food insecurity:    Worry: Not on file    Inability: Not on file  . Transportation needs:    Medical: Not on file    Non-medical: Not on file  Tobacco Use  . Smoking status: Former Smoker    Types: Cigarettes  . Smokeless tobacco: Never Used  Substance and Sexual Activity  . Alcohol use: Yes    Comment: rare  . Drug use: No  . Sexual activity: Yes    Comment: lives with wife, no dietary restrictions, regular exercise  Lifestyle  . Physical activity:    Days per week: Not on file    Minutes per session: Not on file  . Stress: Not on file  Relationships  . Social connections:    Talks on phone: Not on file    Gets together: Not on file    Attends religious service: Not on file  Active member of club or organization: Not on file    Attends meetings of clubs or organizations: Not on file    Relationship status: Not on file  . Intimate partner violence:    Fear of current or ex partner: Not on file    Emotionally abused: Not on file    Physically abused: Not on file    Forced sexual activity: Not on file  Other Topics Concern  . Not on file  Social History Narrative  . Not on file    Outpatient Medications Prior to Visit  Medication Sig Dispense Refill  . amphetamine-dextroamphetamine (ADDERALL) 20 MG tablet Take 1 tablet (20 mg total) by mouth 3 (three) times daily. Reported on 10/28/2015 90 tablet 0  . Coenzyme Q10 (COQ-10) 400 MG CAPS Take 400 mg by mouth daily. Reported on 10/28/2015    . esomeprazole (NEXIUM) 40 MG capsule Take 1 capsule (40 mg total) by mouth daily before breakfast. 90 capsule 0  . Fiber CAPS Take by mouth  daily. Reported on 10/28/2015    . NUCYNTA ER 100 MG 12 hr tablet Take 2 tablets by mouth daily.    . Suvorexant (BELSOMRA) 20 MG TABS Take 20 mg by mouth at bedtime. 30 tablet 2  . traZODone (DESYREL) 100 MG tablet Take 2 tablets (200 mg total) by mouth at bedtime. 60 tablet 3  . atorvastatin (LIPITOR) 80 MG tablet TAKE 1 TABLET BY MOUTH EVERY DAY 90 tablet 0  . lisinopril-hydrochlorothiazide (PRINZIDE,ZESTORETIC) 20-12.5 MG tablet Take 1 tablet by mouth daily. 90 tablet 2   No facility-administered medications prior to visit.     No Known Allergies  Review of Systems  Constitutional: Positive for malaise/fatigue. Negative for fever.  HENT: Negative for congestion.   Eyes: Negative for blurred vision.  Respiratory: Negative for shortness of breath.   Cardiovascular: Negative for chest pain, palpitations and leg swelling.  Gastrointestinal: Positive for abdominal pain. Negative for blood in stool and nausea.  Genitourinary: Negative for dysuria and frequency.  Musculoskeletal: Negative for falls.  Skin: Negative for rash.  Neurological: Positive for dizziness. Negative for loss of consciousness and headaches.  Endo/Heme/Allergies: Negative for environmental allergies.  Psychiatric/Behavioral: Negative for depression. The patient is not nervous/anxious.        Objective:    Physical Exam Vitals signs and nursing note reviewed.  Constitutional:      General: He is not in acute distress.    Appearance: He is well-developed.  HENT:     Head: Normocephalic and atraumatic.     Nose: Nose normal.  Eyes:     General:        Right eye: No discharge.        Left eye: No discharge.  Neck:     Musculoskeletal: Normal range of motion and neck supple.  Cardiovascular:     Rate and Rhythm: Normal rate and regular rhythm.     Heart sounds: No murmur.  Pulmonary:     Effort: Pulmonary effort is normal.     Breath sounds: Normal breath sounds.  Abdominal:     General: Bowel sounds are  normal.     Palpations: Abdomen is soft.     Tenderness: There is no abdominal tenderness.  Skin:    General: Skin is warm and dry.  Neurological:     Mental Status: He is alert and oriented to person, place, and time.     BP 124/68 (BP Location: Left Arm, Patient Position: Sitting, Cuff Size: Normal)  Pulse 74   Temp 97.9 F (36.6 C) (Oral)   Resp 18   Ht _0  (1.626 m)   Wt 151 lb (68.5 kg)   SpO2 98%   BMI 25.92 kg/m  Wt Readings from Last 3 Encounters:  05/09/18 151 lb (68.5 kg)  05/23/17 161 lb 9.6 oz (73.3 kg)  04/25/17 166 lb 3.2 oz (75.4 kg)     Lab Results  Component Value Date   WBC 5.9 05/09/2018   HGB 14.7 05/09/2018   HCT 43.0 05/09/2018   PLT 197.0 05/09/2018   GLUCOSE 105 (H) 05/09/2018   CHOL 158 05/09/2018   TRIG 101.0 05/09/2018   HDL 64.90 05/09/2018   LDLDIRECT 82.0 07/31/2014   LDLCALC 73 05/09/2018   ALT 28 05/09/2018   AST 21 05/09/2018   NA 141 05/09/2018   K 4.7 05/09/2018   CL 103 05/09/2018   CREATININE 0.96 05/09/2018   BUN 21 05/09/2018   CO2 33 (H) 05/09/2018   TSH 1.44 05/09/2018   PSA 4.37 (H) 07/31/2014   HGBA1C 5.9 05/09/2018    Lab Results  Component Value Date   TSH 1.44 05/09/2018   Lab Results  Component Value Date   WBC 5.9 05/09/2018   HGB 14.7 05/09/2018   HCT 43.0 05/09/2018   MCV 91.4 05/09/2018   PLT 197.0 05/09/2018   Lab Results  Component Value Date   NA 141 05/09/2018   K 4.7 05/09/2018   CO2 33 (H) 05/09/2018   GLUCOSE 105 (H) 05/09/2018   BUN 21 05/09/2018   CREATININE 0.96 05/09/2018   BILITOT 0.8 05/09/2018   ALKPHOS 71 05/09/2018   AST 21 05/09/2018   ALT 28 05/09/2018   PROT 6.6 05/09/2018   ALBUMIN 4.2 05/09/2018   CALCIUM 9.8 05/09/2018   GFR 78.35 05/09/2018   Lab Results  Component Value Date   CHOL 158 05/09/2018   Lab Results  Component Value Date   HDL 64.90 05/09/2018   Lab Results  Component Value Date   LDLCALC 73 05/09/2018   Lab Results  Component Value  Date   TRIG 101.0 05/09/2018   Lab Results  Component Value Date   CHOLHDL 2 05/09/2018   Lab Results  Component Value Date   HGBA1C 5.9 05/09/2018       Assessment & Plan:   Problem List Items Addressed This Visit    Essential hypertension   Relevant Medications   lisinopril-hydrochlorothiazide (PRINZIDE,ZESTORETIC) 20-12.5 MG tablet   atorvastatin (LIPITOR) 80 MG tablet   Other Relevant Orders   CBC (Completed)   Comp Met (CMET) (Completed)   HgB A1c (Completed)   TSH (Completed)   Preventative health care   Relevant Orders   HgB A1c (Completed)   Hyperlipidemia - Primary    Encouraged heart healthy diet, increase exercise, avoid trans fats, consider a krill oil cap daily. Tolerating Atorvastatin      Relevant Medications   lisinopril-hydrochlorothiazide (PRINZIDE,ZESTORETIC) 20-12.5 MG tablet   atorvastatin (LIPITOR) 80 MG tablet   Other Relevant Orders   Lipid Profile (Completed)   Abdominal pain   Relevant Orders   US Abdomen Complete   Leg cramps    Hydrate, check Magnesium, cmp. Try Hyland's leg cramp med. prn      Relevant Orders   Magnesium (Completed)   Umbilical hernia without obstruction and without gangrene    Mild abdominal pain but no change in bowel habits. Will proceed with abdominal ultrasound.       Relevant Orders  US Abdomen Complete    Other Visit Diagnoses    Need for vaccination against Streptococcus pneumoniae using pneumococcal conjugate vaccine 13       Relevant Orders   Pneumococcal conjugate vaccine 13-valent (Completed)      I have changed Wenda Low. Santori "Steve"'s atorvastatin. I am also having him maintain his CoQ-10, Fiber, NUCYNTA ER, Suvorexant, traZODone, esomeprazole, amphetamine-dextroamphetamine, and lisinopril-hydrochlorothiazide.  Meds ordered this encounter  Medications  . lisinopril-hydrochlorothiazide (PRINZIDE,ZESTORETIC) 20-12.5 MG tablet    Sig: Take 1 tablet by mouth daily.    Dispense:  90 tablet     Refill:  2  . atorvastatin (LIPITOR) 80 MG tablet    Sig: Take 1 tablet (80 mg total) by mouth daily.    Dispense:  90 tablet    Refill:  1     Penni Homans, MD

## 2018-05-15 ENCOUNTER — Telehealth: Payer: Self-pay | Admitting: *Deleted

## 2018-05-15 NOTE — Telephone Encounter (Signed)
Received Medical records from [facility]-Dr. Diona Fanti; forwarded to provider/SLS 01/22

## 2018-05-23 ENCOUNTER — Other Ambulatory Visit: Payer: Self-pay

## 2018-05-23 NOTE — Telephone Encounter (Signed)
Opened in error

## 2018-05-24 ENCOUNTER — Encounter: Payer: Self-pay | Admitting: Emergency Medicine

## 2018-05-24 DIAGNOSIS — F411 Generalized anxiety disorder: Secondary | ICD-10-CM

## 2018-05-28 ENCOUNTER — Telehealth: Payer: Self-pay | Admitting: *Deleted

## 2018-05-28 NOTE — Telephone Encounter (Signed)
Received Medical records from [facility], Cologuard Results; forwarded to provider/SLS 02/04

## 2018-06-06 ENCOUNTER — Encounter: Payer: Self-pay | Admitting: Family Medicine

## 2018-06-07 ENCOUNTER — Telehealth: Payer: Self-pay | Admitting: Psychiatry

## 2018-06-07 ENCOUNTER — Other Ambulatory Visit: Payer: Self-pay

## 2018-06-07 ENCOUNTER — Other Ambulatory Visit: Payer: Self-pay | Admitting: Psychiatry

## 2018-06-07 MED ORDER — AMPHETAMINE-DEXTROAMPHETAMINE 20 MG PO TABS: ORAL_TABLET | ORAL | 0 refills | Status: DC

## 2018-06-07 MED ORDER — AMPHETAMINE-DEXTROAMPHETAMINE 20 MG PO TABS: 20.0000 mg | ORAL_TABLET | Freq: Three times a day (TID) | ORAL | 0 refills | Status: DC

## 2018-06-07 MED ORDER — SUVOREXANT 20 MG PO TABS: 20.0000 mg | ORAL_TABLET | Freq: Every day | ORAL | 1 refills | Status: DC

## 2018-06-07 NOTE — Telephone Encounter (Signed)
Patient called and said that this is the 4th time he has called today. He is waiting on adderrall and belsomra to be escribed to the pharmacy. It looks like you printed them but did not send it. He is out of his adderral l. Please call him when it was sent

## 2018-06-07 NOTE — Telephone Encounter (Signed)
Pt at [place] waiting on refill for Belsomra and Adderall.  [facility] [address]

## 2018-06-07 NOTE — Telephone Encounter (Signed)
Please submit.

## 2018-07-01 ENCOUNTER — Other Ambulatory Visit: Payer: Self-pay | Admitting: Psychiatry

## 2018-07-01 ENCOUNTER — Telehealth: Payer: Self-pay | Admitting: Psychiatry

## 2018-07-01 DIAGNOSIS — F902 Attention-deficit hyperactivity disorder, combined type: Secondary | ICD-10-CM

## 2018-07-01 MED ORDER — AMPHETAMINE-DEXTROAMPHETAMINE 20 MG PO TABS: ORAL_TABLET | ORAL | 0 refills | Status: DC

## 2018-07-01 MED ORDER — AMPHETAMINE-DEXTROAMPHETAMINE 20 MG PO TABS: 20.0000 mg | ORAL_TABLET | Freq: Three times a day (TID) | ORAL | 0 refills | Status: DC

## 2018-07-01 NOTE — Telephone Encounter (Signed)
Please RF Adderall to [facility] on [address].

## 2018-07-01 NOTE — Progress Notes (Signed)
To Adderall refill.  Appointment in April

## 2018-07-02 NOTE — Telephone Encounter (Signed)
  Gregory Carr  Would you mind calling this pt, Looking over the past few phone calls, not sure what he needs or is asking for thanks

## 2018-07-03 NOTE — Telephone Encounter (Signed)
Let pt know to check with pharmacy and to call back with any problems or concerns. Pt didn't realize they were there.

## 2018-07-03 NOTE — Telephone Encounter (Signed)
Pt already has March and April rx's on file. Needs to check with pharmacy.

## 2018-07-04 ENCOUNTER — Other Ambulatory Visit: Payer: Self-pay | Admitting: Family Medicine

## 2018-07-05 MED ORDER — ESOMEPRAZOLE MAGNESIUM 40 MG PO CPDR: 40.0000 mg | DELAYED_RELEASE_CAPSULE | Freq: Every day | ORAL | 3 refills | Status: DC

## 2018-08-01 ENCOUNTER — Other Ambulatory Visit: Payer: Self-pay

## 2018-08-01 ENCOUNTER — Telehealth: Payer: Self-pay | Admitting: Psychiatry

## 2018-08-01 ENCOUNTER — Other Ambulatory Visit: Payer: Self-pay | Admitting: Physician Assistant

## 2018-08-01 MED ORDER — SUVOREXANT 20 MG PO TABS: 20.0000 mg | ORAL_TABLET | Freq: Every day | ORAL | 1 refills | Status: DC

## 2018-08-01 NOTE — Telephone Encounter (Signed)
Please let pt know I'm sending in, but he needs to make an appt within next month.  Thank you!

## 2018-08-01 NOTE — Telephone Encounter (Signed)
Last visit 01/2018 with Gregory Carr

## 2018-08-01 NOTE — Telephone Encounter (Signed)
Submitted for approval

## 2018-08-01 NOTE — Telephone Encounter (Signed)
Last fill 03/09

## 2018-08-01 NOTE — Telephone Encounter (Signed)
Please RF Belsomra to [facility] on [address].

## 2018-08-07 ENCOUNTER — Other Ambulatory Visit: Payer: Self-pay | Admitting: Family Medicine

## 2018-08-12 ENCOUNTER — Ambulatory Visit: Payer: Federal, State, Local not specified - PPO | Admitting: Psychiatry

## 2018-08-21 ENCOUNTER — Ambulatory Visit: Payer: Federal, State, Local not specified - PPO | Admitting: Physician Assistant

## 2018-08-21 ENCOUNTER — Other Ambulatory Visit: Payer: Self-pay

## 2018-08-23 ENCOUNTER — Ambulatory Visit (INDEPENDENT_AMBULATORY_CARE_PROVIDER_SITE_OTHER): Payer: Federal, State, Local not specified - PPO | Admitting: Physician Assistant

## 2018-08-23 ENCOUNTER — Other Ambulatory Visit: Payer: Self-pay

## 2018-08-23 ENCOUNTER — Encounter: Payer: Self-pay | Admitting: Physician Assistant

## 2018-08-23 DIAGNOSIS — G4700 Insomnia, unspecified: Secondary | ICD-10-CM | POA: Diagnosis not present

## 2018-08-23 DIAGNOSIS — F902 Attention-deficit hyperactivity disorder, combined type: Secondary | ICD-10-CM | POA: Diagnosis not present

## 2018-08-23 MED ORDER — AMPHETAMINE-DEXTROAMPHETAMINE 20 MG PO TABS: 20.0000 mg | ORAL_TABLET | Freq: Three times a day (TID) | ORAL | 0 refills | Status: DC

## 2018-08-23 MED ORDER — ZALEPLON 10 MG PO CAPS: ORAL_CAPSULE | ORAL | 0 refills | Status: DC

## 2018-08-23 MED ORDER — TRAZODONE HCL 100 MG PO TABS: 100.0000 mg | ORAL_TABLET | Freq: Every evening | ORAL | 1 refills | Status: DC | PRN

## 2018-08-23 NOTE — Progress Notes (Signed)
[facility]  Patient ID: Gregory Carr,  MRN: [id number]  PCP: Mosie Lukes, MD  Date of Evaluation: 08/23/2018 Time spent:15 minutes  Chief Complaint:  Chief Complaint    Follow-up     Virtual Visit via Telephone Note  I connected with patient by a video enabled telemedicine application or telephone, with their informed consent, and verified patient privacy and that I am speaking with the correct person using two identifiers.  I am private, in my home and the patient is home.  I discussed the limitations, risks, security and privacy concerns of performing an evaluation and management service by telephone and the availability of in person appointments. I also discussed with the patient that there may be a patient responsible charge related to this service. The patient expressed understanding and agreed to proceed.   I discussed the assessment and treatment plan with the patient. The patient was provided an opportunity to ask questions and all were answered. The patient agreed with the plan and demonstrated an understanding of the instructions.   The patient was advised to call back or seek an in-person evaluation if the symptoms worsen or if the condition fails to improve as anticipated.  I provided 15 minutes of non-face-to-face time during this encounter.  HISTORY/CURRENT STATUS: HPI for routine med check.  Patient states he is not sleeping well.  Having the most trouble falling asleep.  If he does not take any medications, it may be 3 in the morning before he is able to fall asleep.  He does not often wake up in the middle of the night.  He has been on Belsomra for a while and it is not effective.  He was taking Lunesta which was effective but taken off of it due to possible correlation with dementia.  Patient exhibits no signs of dementia.  Patient denies loss of interest in usual activities and is able to enjoy things.  Denies decreased energy or motivation.   Appetite has not changed.  No extreme sadness, tearfulness, or feelings of hopelessness.  Denies any changes in concentration, making decisions or remembering things.  Denies suicidal or homicidal thoughts.  States that attention is good without easy distractibility.  Able to focus on things and finish tasks to completion.   Denies muscle or joint pain, stiffness, or dystonia.  Denies dizziness, syncope, seizures, numbness, tingling, tremor, tics, unsteady gait, slurred speech, confusion.   Individual Medical History/ Review of Systems: Changes? :No    Past medications for mental health diagnoses include: Ambien, Belsomra, Remeron,   Allergies: Patient has no known allergies.  Current Medications:  Current Outpatient Medications:  .  amphetamine-dextroamphetamine (ADDERALL) 20 MG tablet, 1 tid, Disp: 90 tablet, Rfl: 0 .  amphetamine-dextroamphetamine (ADDERALL) 20 MG tablet, Take 1 tablet (20 mg total) by mouth 3 (three) times daily., Disp: 90 tablet, Rfl: 0 .  Coenzyme Q10 (COQ-10) 400 MG CAPS, Take 400 mg by mouth daily. Reported on 10/28/2015, Disp: , Rfl:  .  esomeprazole (NEXIUM) 40 MG capsule, Take 1 capsule (40 mg total) by mouth daily before breakfast., Disp: 90 capsule, Rfl: 3 .  Fiber CAPS, Take by mouth daily. Reported on 10/28/2015, Disp: , Rfl:  .  lisinopril-hydrochlorothiazide (PRINZIDE,ZESTORETIC) 20-12.5 MG tablet, Take 1 tablet by mouth daily., Disp: 90 tablet, Rfl: 2 .  traZODone (DESYREL) 100 MG tablet, Take 1-2 tablets (100-200 mg total) by mouth at bedtime as needed for sleep., Disp: 180 tablet, Rfl: 1 .  [START ON 10/29/2018] amphetamine-dextroamphetamine (ADDERALL)  20 MG tablet, Take 1 tablet (20 mg total) by mouth 3 (three) times daily., Disp: 90 tablet, Rfl: 0 .  [START ON 09/30/2018] amphetamine-dextroamphetamine (ADDERALL) 20 MG tablet, Take 1 tablet (20 mg total) by mouth 3 (three) times daily., Disp: 90 tablet, Rfl: 0 .  [START ON 08/31/2018]  amphetamine-dextroamphetamine (ADDERALL) 20 MG tablet, Take 1 tablet (20 mg total) by mouth 3 (three) times daily., Disp: 90 tablet, Rfl: 0 .  atorvastatin (LIPITOR) 80 MG tablet, TAKE 1 TABLET BY MOUTH EVERY DAY (Patient not taking: Reported on 08/23/2018), Disp: 90 tablet, Rfl: 0 .  NUCYNTA ER 100 MG 12 hr tablet, Take 2 tablets by mouth daily., Disp: , Rfl:  .  zaleplon (SONATA) 10 MG capsule, 1 po qhs prn sleep, and may repeat prn 1 for MNA w/3 hours left to sleep., Disp: 60 capsule, Rfl: 0 Medication Side Effects: none  Family Medical/ Social History: Changes? Yes only because of the shelter in place due to the coronavirus pandemic  [place]:  There were no vitals taken for this visit.There is no height or weight on file to calculate BMI.  General Appearance: Unable to assess.  Eye Contact:  Unable to assess  Speech:  Clear and Coherent  Volume:  Normal  Mood:  Euthymic  Affect:  Unable to assess  Thought Process:  Goal Directed  Orientation:  Full (Time, Place, and Person)  Thought Content: Logical   Suicidal Thoughts:  No  Homicidal Thoughts:  No  Memory:  WNL  Judgement:  Good  Insight:  Good  Psychomotor Activity:  Unable to assess  Concentration:  Concentration: Good and Attention Span: Good  Recall:  Good  Fund of Knowledge: Good  Language: Good  Assets:  Desire for Improvement  ADL's:  Intact  Cognition: WNL  Prognosis:  Good    DIAGNOSES:    ICD-10-CM   1. Attention deficit hyperactivity disorder (ADHD), combined type F90.2   2. Insomnia, unspecified type G47.00     Receiving Psychotherapy: No    RECOMMENDATIONS: DC Belsomra. Start Sonata 10 mg 1 nightly.  May repeat 1 as needed for mid nocturnal awakening as long as he has 3 hours left to sleep.  Sleep hygiene discussed. Continue Adderall 20 mg 1 3 times daily.  Do not take the last Adderall later than 3 PM or so as that may affect his sleep.  PDMP was reviewed. Continue trazodone 100 to 200 mg  nightly as needed sleep. Return in 4 weeks.  Donnal Moat, PA-C   This record has been created using [employer].  Chart creation errors have been sought, but may not always have been located and corrected. Such creation errors do not reflect on the standard of medical care.

## 2018-09-20 ENCOUNTER — Encounter: Payer: Self-pay | Admitting: Physician Assistant

## 2018-09-20 ENCOUNTER — Other Ambulatory Visit: Payer: Self-pay

## 2018-09-20 ENCOUNTER — Ambulatory Visit (INDEPENDENT_AMBULATORY_CARE_PROVIDER_SITE_OTHER): Payer: Federal, State, Local not specified - PPO | Admitting: Physician Assistant

## 2018-09-20 DIAGNOSIS — G4700 Insomnia, unspecified: Secondary | ICD-10-CM | POA: Diagnosis not present

## 2018-09-20 DIAGNOSIS — F902 Attention-deficit hyperactivity disorder, combined type: Secondary | ICD-10-CM | POA: Diagnosis not present

## 2018-09-20 MED ORDER — ZALEPLON 10 MG PO CAPS: ORAL_CAPSULE | ORAL | 2 refills | Status: DC

## 2018-09-20 NOTE — Progress Notes (Signed)
[facility]  Patient ID: Gregory Carr,  MRN: [id number]  PCP: Mosie Lukes, MD  Date of Evaluation: 09/20/2018 Time spent:15 minutes  Chief Complaint:  Chief Complaint    Insomnia; Follow-up     Virtual Visit via Telephone Note  I connected with patient by a video enabled telemedicine application or telephone, with their informed consent, and verified patient privacy and that I am speaking with the correct person using two identifiers.  I am private, in my home and the patient is home.  I discussed the limitations, risks, security and privacy concerns of performing an evaluation and management service by telephone and the availability of in person appointments. I also discussed with the patient that there may be a patient responsible charge related to this service. The patient expressed understanding and agreed to proceed.   I discussed the assessment and treatment plan with the patient. The patient was provided an opportunity to ask questions and all were answered. The patient agreed with the plan and demonstrated an understanding of the instructions.   The patient was advised to call back or seek an in-person evaluation if the symptoms worsen or if the condition fails to improve as anticipated.  I provided 15 minutes of non-face-to-face time during this encounter.  HISTORY/CURRENT STATUS: HPI for 1 month med check.  At the last visit, we stopped Belsomra and started [place].  Patient states it is working very well.  He does not always need to take the middle of the night pill.  Sometimes the pill that he takes at bedtime is enough to put him to sleep and keep him asleep.  He feels so much better now that he is getting more rest.  He has more energy and is more motivated to do things.  He is able to focus and finish tasks pretty well.  Patient denies loss of interest in usual activities and is able to enjoy things.  Denies decreased energy or motivation.  Appetite  has not changed.  No extreme sadness, tearfulness, or feelings of hopelessness.  Denies any changes in concentration, making decisions or remembering things.  Denies suicidal or homicidal thoughts.  Denies dizziness, syncope, seizures, numbness, tingling, tremor, tics, unsteady gait, slurred speech, confusion. Denies muscle or joint pain, stiffness, or dystonia.  Individual Medical History/ Review of Systems: Changes? :No    Past medications for mental health diagnoses include: Ambien, Belsomra, Remeron,   Allergies: Patient has no known allergies.  Current Medications:  Current Outpatient Medications:  .  amphetamine-dextroamphetamine (ADDERALL) 20 MG tablet, 1 tid, Disp: 90 tablet, Rfl: 0 .  amphetamine-dextroamphetamine (ADDERALL) 20 MG tablet, Take 1 tablet (20 mg total) by mouth 3 (three) times daily., Disp: 90 tablet, Rfl: 0 .  [START ON 10/29/2018] amphetamine-dextroamphetamine (ADDERALL) 20 MG tablet, Take 1 tablet (20 mg total) by mouth 3 (three) times daily., Disp: 90 tablet, Rfl: 0 .  [START ON 09/30/2018] amphetamine-dextroamphetamine (ADDERALL) 20 MG tablet, Take 1 tablet (20 mg total) by mouth 3 (three) times daily., Disp: 90 tablet, Rfl: 0 .  amphetamine-dextroamphetamine (ADDERALL) 20 MG tablet, Take 1 tablet (20 mg total) by mouth 3 (three) times daily., Disp: 90 tablet, Rfl: 0 .  atorvastatin (LIPITOR) 80 MG tablet, TAKE 1 TABLET BY MOUTH EVERY DAY, Disp: 90 tablet, Rfl: 0 .  Coenzyme Q10 (COQ-10) 400 MG CAPS, Take 400 mg by mouth daily. Reported on 10/28/2015, Disp: , Rfl:  .  esomeprazole (NEXIUM) 40 MG capsule, Take 1 capsule (40 mg total) by  mouth daily before breakfast., Disp: 90 capsule, Rfl: 3 .  lisinopril-hydrochlorothiazide (PRINZIDE,ZESTORETIC) 20-12.5 MG tablet, Take 1 tablet by mouth daily., Disp: 90 tablet, Rfl: 2 .  morphine (MS CONTIN) 15 MG 12 hr tablet, Take 15 mg by mouth every 12 (twelve) hours., Disp: , Rfl:  .  traZODone (DESYREL) 100 MG tablet, Take 1-2  tablets (100-200 mg total) by mouth at bedtime as needed for sleep., Disp: 180 tablet, Rfl: 1 .  zaleplon (SONATA) 10 MG capsule, 1 po qhs prn sleep, and may repeat prn 1 for MNA w/3 hours left to sleep., Disp: 60 capsule, Rfl: 2 .  Fiber CAPS, Take by mouth daily. Reported on 10/28/2015, Disp: , Rfl:  .  NUCYNTA ER 100 MG 12 hr tablet, Take 2 tablets by mouth daily., Disp: , Rfl:  Medication Side Effects: none  Family Medical/ Social History: Changes? No  MENTAL HEALTH EXAM:  There were no vitals taken for this visit.There is no height or weight on file to calculate BMI.  General Appearance: unable to assess  Eye Contact:  unable to assess  Speech:  Clear and Coherent  Volume:  Normal  Mood:  Euthymic  Affect:  unable to assss  Thought Process:  Goal Directed  Orientation:  Full (Time, Place, and Person)  Thought Content: Logical   Suicidal Thoughts:  No  Homicidal Thoughts:  No  Memory:  WNL  Judgement:  Good  Insight:  Good  Psychomotor Activity:  unable to assess  Concentration:  Concentration: Good and Attention Span: Good  Recall:  Good  Fund of Knowledge: Good  Language: Good  Assets:  Desire for Improvement  ADL's:  Intact  Cognition: WNL  Prognosis:  Good    DIAGNOSES:    ICD-10-CM   1. Attention deficit hyperactivity disorder (ADHD), combined type F90.2   2. Insomnia, unspecified type G47.00     Receiving Psychotherapy: No    RECOMMENDATIONS:  Continue Adderall 20 mg 1 3 times daily. Continue Sonata 10 mg p.o. nightly and may repeat 1 for mid nocturnal awakening as needed as long as he has 3 hours left to sleep. Continue trazodone 100 mg, 1-2 nightly as needed. Return around the first week in August.  Donnal Moat, PA-C   This record has been created using [employer].  Chart creation errors have been sought, but may not always have been located and corrected. Such creation errors do not reflect on the standard of medical care.

## 2018-09-24 ENCOUNTER — Telehealth: Payer: Self-pay

## 2018-09-24 NOTE — Telephone Encounter (Signed)
Prior authorization submitted for zaplelon 10 mg through [employer] approved effective 08/25/2018 through 09/24/2019

## 2018-09-26 ENCOUNTER — Other Ambulatory Visit: Payer: Self-pay

## 2018-09-26 MED ORDER — TRAZODONE HCL 100 MG PO TABS: 100.0000 mg | ORAL_TABLET | Freq: Every evening | ORAL | 1 refills | Status: DC | PRN

## 2018-10-15 ENCOUNTER — Other Ambulatory Visit: Payer: Self-pay | Admitting: Physician Assistant

## 2018-10-15 NOTE — Telephone Encounter (Signed)
Please submit.

## 2018-10-15 NOTE — Telephone Encounter (Signed)
Pt called needs refill on Sonata 10 mg @ [facility]  [address]. Pharmacy states no more refills.

## 2018-11-05 ENCOUNTER — Other Ambulatory Visit: Payer: Self-pay | Admitting: Family Medicine

## 2018-11-19 ENCOUNTER — Encounter: Payer: Self-pay | Admitting: Family Medicine

## 2018-11-19 ENCOUNTER — Ambulatory Visit (INDEPENDENT_AMBULATORY_CARE_PROVIDER_SITE_OTHER): Payer: Federal, State, Local not specified - PPO | Admitting: Family Medicine

## 2018-11-19 ENCOUNTER — Other Ambulatory Visit: Payer: Self-pay

## 2018-11-19 VITALS — BP 120/62 | HR 72 | Temp 98.1°F | Resp 18 | Ht 65.0 in | Wt 159.6 lb

## 2018-11-19 DIAGNOSIS — F902 Attention-deficit hyperactivity disorder, combined type: Secondary | ICD-10-CM

## 2018-11-19 DIAGNOSIS — Z0000 Encounter for general adult medical examination without abnormal findings: Secondary | ICD-10-CM | POA: Diagnosis not present

## 2018-11-19 DIAGNOSIS — E739 Lactose intolerance, unspecified: Secondary | ICD-10-CM

## 2018-11-19 DIAGNOSIS — K219 Gastro-esophageal reflux disease without esophagitis: Secondary | ICD-10-CM

## 2018-11-19 DIAGNOSIS — I10 Essential (primary) hypertension: Secondary | ICD-10-CM | POA: Diagnosis not present

## 2018-11-19 DIAGNOSIS — K429 Umbilical hernia without obstruction or gangrene: Secondary | ICD-10-CM

## 2018-11-19 DIAGNOSIS — E782 Mixed hyperlipidemia: Secondary | ICD-10-CM | POA: Diagnosis not present

## 2018-11-19 LAB — LIPID PANEL
Cholesterol: 140 mg/dL (ref 0–200)
HDL: 55.9 mg/dL (ref >39.00)
LDL Cholesterol: 64 mg/dL (ref 0–99)
NonHDL: 84.25
Total CHOL/HDL Ratio: 3
Triglycerides: 101 mg/dL (ref 0.0–149.0)
VLDL: 20.2 mg/dL (ref 0.0–40.0)

## 2018-11-19 LAB — COMPREHENSIVE METABOLIC PANEL
ALT: 26 U/L (ref 0–53)
AST: 23 U/L (ref 0–37)
Albumin: 4.2 g/dL (ref 3.5–5.2)
Alkaline Phosphatase: 70 U/L (ref 39–117)
BUN: 27 mg/dL — ABNORMAL HIGH (ref 6–23)
CO2: 34 mEq/L — ABNORMAL HIGH (ref 19–32)
Calcium: 9.5 mg/dL (ref 8.4–10.5)
Chloride: 102 mEq/L (ref 96–112)
Creatinine, Ser: 0.97 mg/dL (ref 0.40–1.50)
GFR: 77.3 mL/min (ref >60.00)
Glucose, Bld: 108 mg/dL — ABNORMAL HIGH (ref 70–99)
Potassium: 4.4 mEq/L (ref 3.5–5.1)
Sodium: 140 mEq/L (ref 135–145)
Total Bilirubin: 0.7 mg/dL (ref 0.2–1.2)
Total Protein: 6.4 g/dL (ref 6.0–8.3)

## 2018-11-19 LAB — TSH: TSH: 1.18 u[IU]/mL (ref 0.35–4.50)

## 2018-11-19 LAB — CBC
HCT: 40.7 % (ref 39.0–52.0)
Hemoglobin: 13.7 g/dL (ref 13.0–17.0)
MCHC: 33.8 g/dL (ref 30.0–36.0)
MCV: 93.8 fl (ref 78.0–100.0)
Platelets: 174 10*3/uL (ref 150.0–400.0)
RBC: 4.34 Mil/uL (ref 4.22–5.81)
RDW: 13.4 % (ref 11.5–15.5)
WBC: 5.5 10*3/uL (ref 4.0–10.5)

## 2018-11-19 LAB — HEMOGLOBIN A1C: Hgb A1c MFr Bld: 5.7 % (ref 4.6–6.5)

## 2018-11-19 MED ORDER — ATORVASTATIN CALCIUM 80 MG PO TABS: 80.0000 mg | ORAL_TABLET | Freq: Every day | ORAL | 3 refills | Status: DC

## 2018-11-19 MED ORDER — ESOMEPRAZOLE MAGNESIUM 40 MG PO CPDR: 40.0000 mg | DELAYED_RELEASE_CAPSULE | Freq: Every day | ORAL | 3 refills | Status: DC

## 2018-11-19 MED ORDER — LISINOPRIL-HYDROCHLOROTHIAZIDE 20-12.5 MG PO TABS: 1.0000 | ORAL_TABLET | Freq: Every day | ORAL | 3 refills | Status: DC

## 2018-11-19 NOTE — Assessment & Plan Note (Signed)
Encouraged heart healthy diet, increase exercise, avoid trans fats, consider a krill oil cap daily, tolerating atorvastatin

## 2018-11-19 NOTE — Assessment & Plan Note (Signed)
hgba1c acceptable, minimize simple carbs. Increase exercise as tolerated.  

## 2018-11-19 NOTE — Assessment & Plan Note (Signed)
Well controlled, no changes to meds. Encouraged heart healthy diet such as the DASH diet and exercise as tolerated.  °

## 2018-11-19 NOTE — Patient Instructions (Addendum)
Shingrix is the shingles shot 2 shots over 2-6 months call insurance and confirm coverage and if they say yes then ask them where it is cheapest for you to get it. Document.    Preventive Care 67 Years and Older, Male Preventive care refers to lifestyle choices and visits with your health care provider that can promote health and wellness. This includes:  A yearly physical exam. This is also called an annual well check.  Regular dental and eye exams.  Immunizations.  Screening for certain conditions.  Healthy lifestyle choices, such as diet and exercise. What can I expect for my preventive care visit? Physical exam Your health care provider will check:  Height and weight. These may be used to calculate body mass index (BMI), which is a measurement that tells if you are at a healthy weight.  Heart rate and blood pressure.  Your skin for abnormal spots. Counseling Your health care provider may ask you questions about:  Alcohol, tobacco, and drug use.  Emotional well-being.  Home and relat      ionship well-being.  Sexual activity.  Eating habits.  History of falls.  Memory and ability to understand (cognition).  Work and work Statistician. What immunizations do I need?  Influenza (flu) vaccine  This is recommended every year. Tetanus, diphtheria, and pertussis (Tdap) vaccine  You may need a Td booster every 10 years. Varicella (chickenpox) vaccine  You may need this vaccine if you have not already been vaccinated. Zoster (shingles) vaccine  You may need this after age 67. Pneumococcal conjugate (PCV13) vaccine  One dose is recommended after age 67. Pneumococcal polysaccharide (PPSV23) vaccine  One dose is recommended after age 67. Measles, mumps, and rubella (MMR) vaccine  You may need at least one dose of MMR if you were born in 1957 or later. You may also need a second dose. Meningococcal conjugate (MenACWY) vaccine  You may need this if you  have certain conditions. Hepatitis A vaccine  You may need this if you have certain conditions or if you travel or work in places where you may be exposed to hepatitis A. Hepatitis B vaccine  You may need this if you have certain conditions or if you travel or work in places where you may be exposed to hepatitis B. Haemophilus influenzae type b (Hib) vaccine  You may need this if you have certain conditions. You may receive vaccines as individual doses or as more than one vaccine together in one shot (combination vaccines). Talk with your health care provider about the risks and benefits of combination vaccines. What tests do I need? Blood tests  Lipid and cholesterol levels. These may be checked every 5 years, or more frequently depending on your overall health.  Hepatitis C test.  Hepatitis B test. Screening  Lung cancer screening. You may have this screening every year starting at age 67 if you have a 30-pack-year history of smoking and currently smoke or have quit within the past 15 years.  Colorectal cancer screening. All adults should have this screening starting at age 67 and continuing until age 76. Your health care provider may recommend screening at age 67 if you are at increased risk. You will have tests every 1-10 years, depending on your results and the type of screening test.  Prostate cancer screening. Recommendations will vary depending on your family history and other risks.  Diabetes screening. This is done by checking your blood sugar (glucose) after you have not eaten for a while (fasting).  You may have this done every 1-3 years.  Abdominal aortic aneurysm (AAA) screening. You may need this if you are a current or former smoker.  Sexually transmitted disease (STD) testing. Follow these instructions at home: Eating and drinking  Eat a diet that includes fresh fruits and vegetables, whole grains, lean protein, and low-fat dairy products. Limit your intake of foods  with high amounts of sugar, saturated fats, and salt.  Take vitamin and mineral supplements as recommended by your health care provider.  Do not drink alcohol if your health care provider tells you not to drink.  If you drink alcohol: ? Limit how much you have to 0-2 drinks a day. ? Be aware of how much alcohol is in your drink. In the [place] one drink equals one 12 oz bottle of beer (355 mL), one 5 oz glass of wine (148 mL), or one 1 oz glass of hard liquor (44 mL). Lifestyle  Take daily care of your teeth and gums.  Stay active. Exercise for at least 30 minutes on 5 or more days each week.  Do not use any products that contain nicotine or tobacco, such as cigarettes, e-cigarettes, and chewing tobacco. If you need help quitting, ask your health care provider.  If you are sexually active, practice safe sex. Use a condom or other form of protection to prevent STIs (sexually transmitted infections).  Talk with your health care provider about taking a low-dose aspirin or statin. What's next?  Visit your health care provider once a year for a well check visit.  Ask your health care provider how often you should have your eyes and teeth checked.  Stay up to date on all vaccines. This information is not intended to replace advice given to you by your health care provider. Make sure you discuss any questions you have with your health care provider. Document Released: 05/07/2015 Document Revised: 04/04/2018 Document Reviewed: 04/04/2018 Elsevier Patient Education  2020 [employer].

## 2018-11-19 NOTE — Assessment & Plan Note (Signed)
Avoid offending foods, start probiotics. Do not eat large meals in late evening and consider raising head of bed. Doing well on current meds. Did not tolerate the prep for his last colonoscopy. Request coloquard results from [facility]

## 2018-11-19 NOTE — Assessment & Plan Note (Signed)
Following with psychiatry.

## 2018-11-19 NOTE — Progress Notes (Signed)
Subjective:    Patient ID: Gregory Carr, male    DOB: Nov 07, 1950, 68 y.o.   MRN: [id number]  No chief complaint on file.   HPI Patient is in today for annual preventative exam and follow up on chronic medical concerns including hyperlipidemia, hypertension, umbilical hernia and more. He feels well today. No recent febrile illness or hospitalizations. No polyuria or polydipsia, no acute concerns. Hs umbilical hernia is unchanged and largely asymptomatic. Is maintaining quarantine most of the time and wearing a mask. Denies CP/palp/SOB/HA/congestion/fevers/GI or GU c/o. Taking meds as prescribed  Past Medical History:  Diagnosis Date  . Abdominal pain, left lower quadrant 01/17/2015  . BACK PAIN, CHRONIC 11/11/2007   Qualifier: Diagnosis of  By: Redmond Pulling MD, Frann Rider    . BARRETTS ESOPHAGUS 11/11/2007   Qualifier: Diagnosis of  By: Redmond Pulling MD, Frann Rider    . BPH (benign prostatic hyperplasia) 09/13/2012  . BPH (benign prostatic hyperplasia) 09/13/2012   Follows with [facility]   . ERECTILE DYSFUNCTION 08/16/2006   Qualifier: Diagnosis of  By: Cletus Gash MD, [place]    . GERD 08/16/2006   Qualifier: Diagnosis of  By: Cletus Gash MD, [place]    . [place] SYNDROME 01/22/2008   Qualifier: Diagnosis of  By: Redmond Pulling MD, Frann Rider    . GLUCOSE INTOLERANCE 12/11/2007   Qualifier: Diagnosis of  By: Redmond Pulling MD, Frann Rider     . Grief reaction 11/07/2015  . HTN (hypertension) 03/24/2011  . HYPERLIPIDEMIA 08/16/2006   Qualifier: Diagnosis of  By: Cletus Gash MD, [facility]    . Onychomycosis 09/13/2012  . Other anxiety states 04/08/2007   Qualifier: Diagnosis of  By: Cletus Gash MD, [place]    . Preventative health care 09/29/2013  . Renal cell carcinoma ([place]) 12/15/2011   S/p left nephrectomy Follows with [facility]   . Right hip pain 07/21/2016    History reviewed. No pertinent surgical history.  Family History  Problem Relation Age of Onset  . Dementia Mother   . Cancer Daughter        brain cancer,  glioblastoma  . Hypertension Maternal Grandfather   . Heart disease Maternal Grandfather        MI  . Stroke Paternal Grandfather   . Arthritis Sister     Social History   Socioeconomic History  . Marital status: Widowed    Spouse name: Not on file  . Number of children: Not on file  . Years of education: Not on file  . Highest education level: Not on file  Occupational History  . Not on file  Social Needs  . Financial resource strain: Not on file  . Food insecurity    Worry: Not on file    Inability: Not on file  . Transportation needs    Medical: Not on file    Non-medical: Not on file  Tobacco Use  . Smoking status: Former Smoker    Types: Cigarettes    Quit date: 08/23/1975    Years since quitting: 43.2  . Smokeless tobacco: Never Used  Substance and Sexual Activity  . Alcohol use: Yes    Comment: rare  . Drug use: No  . Sexual activity: Yes    Comment: lives with wife, no dietary restrictions, regular exercise  Lifestyle  . Physical activity    Days per week: Not on file    Minutes per session: Not on file  . Stress: Not on file  Relationships  . Social connections    Talks on phone: Not on file  Gets together: Not on file    Attends religious service: Not on file    Active member of club or organization: Not on file    Attends meetings of clubs or organizations: Not on file    Relationship status: Not on file  . Intimate partner violence    Fear of current or ex partner: Not on file    Emotionally abused: Not on file    Physically abused: Not on file    Forced sexual activity: Not on file  Other Topics Concern  . Not on file  Social History Narrative  . Not on file    Outpatient Medications Prior to Visit  Medication Sig Dispense Refill  . amphetamine-dextroamphetamine (ADDERALL) 20 MG tablet 1 tid 90 tablet 0  . amphetamine-dextroamphetamine (ADDERALL) 20 MG tablet Take 1 tablet (20 mg total) by mouth 3 (three) times daily. 90 tablet 0  .  amphetamine-dextroamphetamine (ADDERALL) 20 MG tablet Take 1 tablet (20 mg total) by mouth 3 (three) times daily. 90 tablet 0  . amphetamine-dextroamphetamine (ADDERALL) 20 MG tablet Take 1 tablet (20 mg total) by mouth 3 (three) times daily. 90 tablet 0  . amphetamine-dextroamphetamine (ADDERALL) 20 MG tablet Take 1 tablet (20 mg total) by mouth 3 (three) times daily. 90 tablet 0  . Coenzyme Q10 (COQ-10) 400 MG CAPS Take 400 mg by mouth daily. Reported on 10/28/2015    . Fiber CAPS Take by mouth daily. Reported on 10/28/2015    . morphine (MS CONTIN) 15 MG 12 hr tablet Take 15 mg by mouth every 12 (twelve) hours.    Gean [place]ch[place] ER 100 MG 12 hr tablet Take 2 tablets by mouth daily.    . traZODone (DESYREL) 100 MG tablet Take 1-2 tablets (100-200 mg total) by mouth at bedtime as needed for sleep. 180 tablet 1  . zaleplon (SONATA) 10 MG capsule TAKE 1 CAPSULE BY MOUTH AT BEDTIME AS NEEDED FOR SLEEP MAY  REPEAT  AS  NEEDED  FOR  1  TABLET  WITH  3  HOURS  LEFT  TO  SLEEP 60 capsule 0  . atorvastatin (LIPITOR) 80 MG tablet TAKE 1 TABLET BY MOUTH EVERY DAY 90 tablet 0  . esomeprazole (NEXIUM) 40 MG capsule Take 1 capsule (40 mg total) by mouth daily before breakfast. 90 capsule 3  . lisinopril-hydrochlorothiazide (PRINZIDE,ZESTORETIC) 20-12.5 MG tablet Take 1 tablet by mouth daily. 90 tablet 2   No facility-administered medications prior to visit.     No Known Allergies  Review of Systems  Constitutional: Negative for chills, fever and malaise/fatigue.  HENT: Negative for congestion and hearing loss.   Eyes: Negative for discharge.  Respiratory: Negative for cough, sputum production and shortness of breath.   Cardiovascular: Negative for chest pain, palpitations and leg swelling.  Gastrointestinal: Negative for abdominal pain, blood in stool, constipation, diarrhea, heartburn, nausea and vomiting.  Genitourinary: Negative for dysuria, frequency, hematuria and urgency.  Musculoskeletal: Negative for  back pain, falls and myalgias.  Skin: Negative for rash.  Neurological: Negative for dizziness, sensory change, loss of consciousness, weakness and headaches.  Endo/Heme/Allergies: Negative for environmental allergies. Does not bruise/bleed easily.  Psychiatric/Behavioral: Negative for depression and suicidal ideas. The patient is not nervous/anxious and does not have insomnia.        Objective:    Physical Exam Vitals signs and nursing note reviewed.  Constitutional:      General: He is not in acute distress.    Appearance: Normal appearance. He is well-developed. He  is obese. He is not ill-appearing.  HENT:     Head: Normocephalic and atraumatic.     Right Ear: Tympanic membrane, ear canal and external ear normal. There is no impacted cerumen.     Left Ear: Tympanic membrane, ear canal and external ear normal. There is no impacted cerumen.     Nose: Nose normal. No rhinorrhea.     Mouth/Throat:     Mouth: Mucous membranes are moist.     Pharynx: Oropharynx is clear.  Eyes:     General:        Right eye: No discharge.        Left eye: No discharge.  Neck:     Musculoskeletal: Normal range of motion and neck supple.  Cardiovascular:     Rate and Rhythm: Normal rate and regular rhythm.     Heart sounds: No murmur.  Pulmonary:     Effort: Pulmonary effort is normal.     Breath sounds: Normal breath sounds.  Abdominal:     General: Bowel sounds are normal.     Palpations: Abdomen is soft. There is no mass.     Tenderness: There is no abdominal tenderness.     Hernia: A hernia is present.     Comments: Small umbilical hernia, reducible, nontender.   Skin:    General: Skin is warm and dry.  Neurological:     Mental Status: He is alert and oriented to person, place, and time.     BP 120/62 (BP Location: Left Arm, Patient Position: Sitting, Cuff Size: Normal)   Pulse 72   Temp 98.1 F (36.7 C) (Oral)   Resp 18   Ht 5\' 5"  (1.651 m)   Wt 159 lb 9.6 oz (72.4 kg)   SpO2  97%   BMI 26.56 kg/m  Wt Readings from Last 3 Encounters:  11/19/18 159 lb 9.6 oz (72.4 kg)  05/09/18 151 lb (68.5 kg)  05/23/17 161 lb 9.6 oz (73.3 kg)    Diabetic Foot Exam - Simple   No data filed     Lab Results  Component Value Date   WBC 5.9 05/09/2018   HGB 14.7 05/09/2018   HCT 43.0 05/09/2018   PLT 197.0 05/09/2018   GLUCOSE 105 (H) 05/09/2018   CHOL 158 05/09/2018   TRIG 101.0 05/09/2018   HDL 64.90 05/09/2018   LDLDIRECT 82.0 07/31/2014   LDLCALC 73 05/09/2018   ALT 28 05/09/2018   AST 21 05/09/2018   NA 141 05/09/2018   K 4.7 05/09/2018   CL 103 05/09/2018   CREATININE 0.96 05/09/2018   BUN 21 05/09/2018   CO2 33 (H) 05/09/2018   TSH 1.44 05/09/2018   PSA 4.37 (H) 07/31/2014   HGBA1C 5.9 05/09/2018    Lab Results  Component Value Date   TSH 1.44 05/09/2018   Lab Results  Component Value Date   WBC 5.9 05/09/2018   HGB 14.7 05/09/2018   HCT 43.0 05/09/2018   MCV 91.4 05/09/2018   PLT 197.0 05/09/2018   Lab Results  Component Value Date   NA 141 05/09/2018   K 4.7 05/09/2018   CO2 33 (H) 05/09/2018   GLUCOSE 105 (H) 05/09/2018   BUN 21 05/09/2018   CREATININE 0.96 05/09/2018   BILITOT 0.8 05/09/2018   ALKPHOS 71 05/09/2018   AST 21 05/09/2018   ALT 28 05/09/2018   PROT 6.6 05/09/2018   ALBUMIN 4.2 05/09/2018   CALCIUM 9.8 05/09/2018   GFR 78.35 05/09/2018   Lab  Results  Component Value Date   CHOL 158 05/09/2018   Lab Results  Component Value Date   HDL 64.90 05/09/2018   Lab Results  Component Value Date   LDLCALC 73 05/09/2018   Lab Results  Component Value Date   TRIG 101.0 05/09/2018   Lab Results  Component Value Date   CHOLHDL 2 05/09/2018   Lab Results  Component Value Date   HGBA1C 5.9 05/09/2018       Assessment & Plan:   Problem List Items Addressed This Visit    GERD (Chronic)    Avoid offending foods, start probiotics. Do not eat large meals in late evening and consider raising head of bed. Doing  well on current meds. Did not tolerate the prep for his last colonoscopy. [facility]      Relevant Medications   esomeprazole (NEXIUM) 40 MG capsule   GLUCOSE INTOLERANCE - Primary    hgba1c acceptable, minimize simple carbs. Increase exercise as tolerated.       Relevant Orders   Hemoglobin A1c   Essential hypertension    Well controlled, no changes to meds. Encouraged heart healthy diet such as the DASH diet and exercise as tolerated.       Relevant Medications   lisinopril-hydrochlorothiazide (ZESTORETIC) 20-12.5 MG tablet   atorvastatin (LIPITOR) 80 MG tablet   Other Relevant Orders   CBC   Comprehensive metabolic panel   TSH   Attention deficit hyperactivity disorder (ADHD)    Following with psychiatry      Preventative health care    Patient encouraged to maintain heart healthy diet, regular exercise, adequate sleep. Consider daily probiotics. Take medications as prescribed. Follows with urology and gastroenterology      Hyperlipidemia    Encouraged heart healthy diet, increase exercise, avoid trans fats, consider a krill oil cap daily, tolerating atorvastatin      Relevant Medications   lisinopril-hydrochlorothiazide (ZESTORETIC) 20-12.5 MG tablet   atorvastatin (LIPITOR) 80 MG tablet   Other Relevant Orders   Lipid panel   Umbilical hernia without obstruction and without gangrene    Asymptomatic vast majority of the time and occasionally slightly noticeable. He will notify us if worsens so we can refer for consultation regarding correction.          I have changed Gregory Carr "Steve"'s lisinopril-hydrochlorothiazide and atorvastatin. I am also having him maintain his CoQ-10, Fiber, Nucynta ER, amphetamine-dextroamphetamine, amphetamine-dextroamphetamine, amphetamine-dextroamphetamine, amphetamine-dextroamphetamine, amphetamine-dextroamphetamine, morphine, traZODone, zaleplon, and esomeprazole.  Meds ordered this encounter   Medications  . lisinopril-hydrochlorothiazide (ZESTORETIC) 20-12.5 MG tablet    Sig: Take 1 tablet by mouth daily.    Dispense:  90 tablet    Refill:  3  . atorvastatin (LIPITOR) 80 MG tablet    Sig: Take 1 tablet (80 mg total) by mouth daily.    Dispense:  90 tablet    Refill:  3  . esomeprazole (NEXIUM) 40 MG capsule    Sig: Take 1 capsule (40 mg total) by mouth daily before breakfast.    Dispense:  90 capsule    Refill:  3     Penni Homans, MD

## 2018-11-19 NOTE — Assessment & Plan Note (Signed)
Patient encouraged to maintain heart healthy diet, regular exercise, adequate sleep. Consider daily probiotics. Take medications as prescribed. Follows with urology and gastroenterology

## 2018-11-19 NOTE — Assessment & Plan Note (Signed)
Asymptomatic vast majority of the time and occasionally slightly noticeable. He will notify us if worsens so we can refer for consultation regarding correction.

## 2018-11-23 ENCOUNTER — Other Ambulatory Visit: Payer: Self-pay | Admitting: Physician Assistant

## 2018-11-24 NOTE — Telephone Encounter (Signed)
Last fill 06/23 Next appt 08/20

## 2018-11-25 ENCOUNTER — Telehealth: Payer: Self-pay | Admitting: Physician Assistant

## 2018-11-25 NOTE — Telephone Encounter (Signed)
Pt requesting . Please disregard.

## 2018-12-06 ENCOUNTER — Other Ambulatory Visit: Payer: Self-pay

## 2018-12-06 ENCOUNTER — Telehealth: Payer: Self-pay | Admitting: Physician Assistant

## 2018-12-06 DIAGNOSIS — F902 Attention-deficit hyperactivity disorder, combined type: Secondary | ICD-10-CM

## 2018-12-06 MED ORDER — AMPHETAMINE-DEXTROAMPHETAMINE 20 MG PO TABS: 20.0000 mg | ORAL_TABLET | Freq: Three times a day (TID) | ORAL | 0 refills | Status: DC

## 2018-12-06 NOTE — Telephone Encounter (Signed)
[facility] called to request of his Adderall.  Appt 12/17/18.  Send to [facility] on [address]

## 2018-12-06 NOTE — Telephone Encounter (Signed)
Pended for approval.

## 2018-12-07 ENCOUNTER — Other Ambulatory Visit: Payer: Self-pay

## 2018-12-07 ENCOUNTER — Encounter (HOSPITAL_BASED_OUTPATIENT_CLINIC_OR_DEPARTMENT_OTHER): Payer: Self-pay | Admitting: Emergency Medicine

## 2018-12-07 ENCOUNTER — Emergency Department (HOSPITAL_BASED_OUTPATIENT_CLINIC_OR_DEPARTMENT_OTHER): Payer: Federal, State, Local not specified - PPO

## 2018-12-07 ENCOUNTER — Emergency Department (HOSPITAL_BASED_OUTPATIENT_CLINIC_OR_DEPARTMENT_OTHER)
Admission: EM | Admit: 2018-12-07 | Discharge: 2018-12-07 | Disposition: A | Discharge status: Home / Self Care | Payer: Federal, State, Local not specified - PPO | Attending: Emergency Medicine | Admitting: Emergency Medicine

## 2018-12-07 DIAGNOSIS — R109 Unspecified abdominal pain: Secondary | ICD-10-CM | POA: Insufficient documentation

## 2018-12-07 DIAGNOSIS — Y998 Other external cause status: Secondary | ICD-10-CM | POA: Diagnosis not present

## 2018-12-07 DIAGNOSIS — Z79899 Other long term (current) drug therapy: Secondary | ICD-10-CM | POA: Insufficient documentation

## 2018-12-07 DIAGNOSIS — Y92018 Other place in single-family (private) house as the place of occurrence of the external cause: Secondary | ICD-10-CM | POA: Diagnosis not present

## 2018-12-07 DIAGNOSIS — R55 Syncope and collapse: Secondary | ICD-10-CM | POA: Diagnosis not present

## 2018-12-07 DIAGNOSIS — Z23 Encounter for immunization: Secondary | ICD-10-CM | POA: Diagnosis not present

## 2018-12-07 DIAGNOSIS — R197 Diarrhea, unspecified: Secondary | ICD-10-CM | POA: Insufficient documentation

## 2018-12-07 DIAGNOSIS — I10 Essential (primary) hypertension: Secondary | ICD-10-CM | POA: Insufficient documentation

## 2018-12-07 DIAGNOSIS — Z85528 Personal history of other malignant neoplasm of kidney: Secondary | ICD-10-CM | POA: Diagnosis not present

## 2018-12-07 DIAGNOSIS — S0181XA Laceration without foreign body of other part of head, initial encounter: Secondary | ICD-10-CM | POA: Diagnosis not present

## 2018-12-07 DIAGNOSIS — Y9301 Activity, walking, marching and hiking: Secondary | ICD-10-CM | POA: Diagnosis not present

## 2018-12-07 DIAGNOSIS — E860 Dehydration: Secondary | ICD-10-CM | POA: Diagnosis not present

## 2018-12-07 DIAGNOSIS — S0990XA Unspecified injury of head, initial encounter: Secondary | ICD-10-CM | POA: Diagnosis present

## 2018-12-07 DIAGNOSIS — Z87891 Personal history of nicotine dependence: Secondary | ICD-10-CM | POA: Insufficient documentation

## 2018-12-07 DIAGNOSIS — W01198A Fall on same level from slipping, tripping and stumbling with subsequent striking against other object, initial encounter: Secondary | ICD-10-CM | POA: Diagnosis not present

## 2018-12-07 LAB — CBC
HCT: 42.6 % (ref 39.0–52.0)
Hemoglobin: 14.6 g/dL (ref 13.0–17.0)
MCH: 31.6 pg (ref 26.0–34.0)
MCHC: 34.3 g/dL (ref 30.0–36.0)
MCV: 92.2 fL (ref 80.0–100.0)
Platelets: 210 10*3/uL (ref 150–400)
RBC: 4.62 MIL/uL (ref 4.22–5.81)
RDW: 12.5 % (ref 11.5–15.5)
WBC: 10.7 10*3/uL — ABNORMAL HIGH (ref 4.0–10.5)
nRBC: 0 % (ref 0.0–0.2)

## 2018-12-07 LAB — BASIC METABOLIC PANEL
Anion gap: 10 (ref 5–15)
BUN: 40 mg/dL — ABNORMAL HIGH (ref 8–23)
CO2: 26 mmol/L (ref 22–32)
Calcium: 9.2 mg/dL (ref 8.9–10.3)
Chloride: 102 mmol/L (ref 98–111)
Creatinine, Ser: 1.37 mg/dL — ABNORMAL HIGH (ref 0.61–1.24)
GFR calc Af Amer: >60 mL/min (ref >60)
GFR calc non Af Amer: 53 mL/min — ABNORMAL LOW (ref >60)
Glucose, Bld: 100 mg/dL — ABNORMAL HIGH (ref 70–99)
Potassium: 4 mmol/L (ref 3.5–5.1)
Sodium: 138 mmol/L (ref 135–145)

## 2018-12-07 MED ORDER — TETANUS-DIPHTH-ACELL PERTUSSIS 5-2.5-18.5 LF-MCG/0.5 IM SUSP
0.5000 mL | Freq: Once | INTRAMUSCULAR | Status: AC
  Administered 2018-12-07: 0.5 mL via INTRAMUSCULAR
  Filled 2018-12-07: qty 0.5

## 2018-12-07 MED ORDER — LIDOCAINE HCL (PF) 1 % IJ SOLN
30.0000 mL | Freq: Once | INTRAMUSCULAR | Status: AC
  Administered 2018-12-07: 30 mL
  Filled 2018-12-07: qty 30

## 2018-12-07 MED ORDER — BACITRACIN ZINC 500 UNIT/GM EX OINT
1.0000 "application " | TOPICAL_OINTMENT | Freq: Once | CUTANEOUS | Status: AC
  Administered 2018-12-07: 1 via TOPICAL

## 2018-12-07 MED ORDER — SODIUM CHLORIDE 0.9 % IV BOLUS
1000.0000 mL | Freq: Once | INTRAVENOUS | Status: AC
  Administered 2018-12-07: 1000 mL via INTRAVENOUS

## 2018-12-07 NOTE — ED Triage Notes (Signed)
Pt here after a syncopal episode and fall. Laceration to forehead. States it happened after having generalized abdominal pain.

## 2018-12-07 NOTE — Discharge Instructions (Addendum)
The sutures placed this evening are absorbable.  You do not have to have them removed.  Apply antibiotic ointment to the wound daily.  Make sure to drink plenty of fluids.

## 2018-12-07 NOTE — ED Notes (Signed)
Pt upset that it is taking too long and would like to leave. I reassured him that we were just waiting on CT results and that he would go home soon.

## 2018-12-07 NOTE — ED Provider Notes (Signed)
[facility] EMERGENCY DEPARTMENT Provider Note   CSN: [id number] Arrival date & time: 12/07/18  9371     History   Chief Complaint Chief Complaint  Patient presents with  . Fall  . Loss of Consciousness    HPI Gregory Carr is a 68 y.o. male.     HPI Patient presents to the ED for evaluation of a head injury and fainting episode.  Patient states he was walking in his house.  He had been experiencing some abdominal pain and discomfort.  He also had been having some episodes of diarrhea recently.  Patient states as he was walking he must have had a syncopal episode because the next thing he remembers is lying on the floor face forward with a laceration to his forehead.  Patient thinks he was only unconscious for a brief period of time.  Right now he has a headache.  He denies any neck pain.  He denies any numbness or weakness.  He is not having any abdominal pain right now.  He denies any chest pain.  He was able to come in on his own.  Patient did sustain a laceration on his forehead.  He does have pain there as well. Past Medical History:  Diagnosis Date  . Abdominal pain, left lower quadrant 01/17/2015  . BACK PAIN, CHRONIC 11/11/2007   Qualifier: Diagnosis of  By: Redmond Pulling MD, Frann Rider    . BARRETTS ESOPHAGUS 11/11/2007   Qualifier: Diagnosis of  By: Redmond Pulling MD, Frann Rider    . BPH (benign prostatic hyperplasia) 09/13/2012  . BPH (benign prostatic hyperplasia) 09/13/2012   Follows with [facility]   . ERECTILE DYSFUNCTION 08/16/2006   Qualifier: Diagnosis of  By: Cletus Gash MD, [place]    . GERD 08/16/2006   Qualifier: Diagnosis of  By: Cletus Gash MD, [place]    . [place] SYNDROME 01/22/2008   Qualifier: Diagnosis of  By: Redmond Pulling MD, Frann Rider    . GLUCOSE INTOLERANCE 12/11/2007   Qualifier: Diagnosis of  By: Redmond Pulling MD, Frann Rider     . Grief reaction 11/07/2015  . HTN (hypertension) 03/24/2011  . HYPERLIPIDEMIA 08/16/2006   Qualifier: Diagnosis of  By: Cletus Gash MD, [place]    .  Onychomycosis 09/13/2012  . Other anxiety states 04/08/2007   Qualifier: Diagnosis of  By: Cletus Gash MD, [place]    . Preventative health care 09/29/2013  . Renal cell carcinoma ([place]) 12/15/2011   S/p left nephrectomy Follows with [facility]   . Right hip pain 07/21/2016    Patient Active Problem List   Diagnosis Date Noted  . GAD (generalized anxiety disorder) 05/24/2018  . Leg cramps 05/13/2018  . Umbilical hernia without obstruction and without gangrene 05/13/2018  . Right hip pain 07/21/2016  . Grief reaction 11/07/2015  . Motion sickness 01/17/2015  . Abdominal pain, left lower quadrant 01/17/2015  . Hyperlipidemia 01/26/2014  . Preventative health care 09/29/2013  . Hallux limitus of left foot 11/08/2012  . Nail deformity 11/08/2012  . Onychomycosis 09/13/2012  . BPH (benign prostatic hyperplasia) 09/13/2012  . Renal cell carcinoma ([place]) 12/15/2011  . FATIGUE 10/04/2009  . MYALGIA 10/12/2008  . 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 GLUCOSE INTOLERANCE 12/11/2007  . OTHER DECREASED WHITE BLOOD CELL COUNT 12/11/2007  . Jaundice, non-neonatal 12/11/2007  . BARRETTS ESOPHAGUS 11/11/2007  . BACK PAIN, CHRONIC 11/11/2007  . Inguinal hernia 05/13/2007  . OTHER ANXIETY STATES 04/08/2007  . APHTHOUS ULCERS 04/08/2007  . Essential hypertension 08/16/2006  . ERECTILE DYSFUNCTION 08/16/2006  . Attention  deficit hyperactivity disorder (ADHD) 08/16/2006  . GERD 08/16/2006  . LOW BACK PAIN 08/16/2006  . INSOMNIA 08/16/2006    History reviewed. No pertinent surgical history.      Home Medications    Prior to Admission medications   Medication Sig Start Date End Date Taking? Authorizing Provider  amphetamine-dextroamphetamine (ADDERALL) 20 MG tablet 1 tid 07/01/18   Cottle, Billey Co., MD  amphetamine-dextroamphetamine (ADDERALL) 20 MG tablet Take 1 tablet (20 mg total) by mouth 3 (three) times daily. 10/29/18   Donnal Moat T, PA-C  amphetamine-dextroamphetamine  (ADDERALL) 20 MG tablet Take 1 tablet (20 mg total) by mouth 3 (three) times daily. 09/30/18   Donnal Moat T, PA-C  amphetamine-dextroamphetamine (ADDERALL) 20 MG tablet Take 1 tablet (20 mg total) by mouth 3 (three) times daily. 08/31/18   Donnal Moat T, PA-C  amphetamine-dextroamphetamine (ADDERALL) 20 MG tablet Take 1 tablet (20 mg total) by mouth 3 (three) times daily. 12/06/18   Donnal Moat T, PA-C  atorvastatin (LIPITOR) 80 MG tablet Take 1 tablet (80 mg total) by mouth daily. 11/19/18   Mosie Lukes, MD  Coenzyme Q10 (COQ-10) 400 MG CAPS Take 400 mg by mouth daily. Reported on 10/28/2015    [provider]  esomeprazole (NEXIUM) 40 MG capsule Take 1 capsule (40 mg total) by mouth daily before breakfast. 11/19/18   Mosie Lukes, MD  Fiber CAPS Take by mouth daily. Reported on 10/28/2015    [provider]  lisinopril-hydrochlorothiazide (ZESTORETIC) 20-12.5 MG tablet Take 1 tablet by mouth daily. 11/19/18   Mosie Lukes, MD  morphine (MS CONTIN) 15 MG 12 hr tablet Take 15 mg by mouth every 12 (twelve) hours. 08/23/18   [provider]  NUCYNTA ER 100 MG 12 hr tablet Take 2 tablets by mouth daily. 01/02/17   [provider]  traZODone (DESYREL) 100 MG tablet Take 1-2 tablets (100-200 mg total) by mouth at bedtime as needed for sleep. 09/26/18   Donnal Moat T, PA-C  zaleplon (SONATA) 10 MG capsule TAKE 1 CAPSULE BY MOUTH AT BEDTIME AS NEEDED FOR SLEEP .  MAY  REPEAT  AS  NEEDED  FOR  1  TABLET  WITH  3  HOURS  LEFT  TO  SLEEP. 11/25/18   Addison Lank, PA-C    Family History Family History  Problem Relation Age of Onset  . Dementia Mother   . Cancer Daughter        brain cancer, glioblastoma  . Hypertension Maternal Grandfather   . Heart disease Maternal Grandfather        MI  . Stroke Paternal Grandfather   . Arthritis Sister     Social History Social History   Tobacco Use  . Smoking status: Former Smoker    Types: Cigarettes    Quit date:  08/23/1975    Years since quitting: 43.3  . Smokeless tobacco: Never Used  Substance Use Topics  . Alcohol use: Yes    Comment: rare  . Drug use: No     Allergies   Patient has no known allergies.   Review of Systems Review of Systems  All other systems reviewed and are negative.    Physical Exam Updated Vital Signs BP 106/66 (BP Location: Right Arm)   Pulse 72   Temp 98 F (36.7 C) (Oral)   Resp 16   Ht 1.651 m (5\' 5" )   Wt 68 kg   SpO2 100%   BMI 24.96 kg/m  Physical Exam Vitals signs and nursing note reviewed.  Constitutional:      General: He is not in acute distress.    Appearance: He is well-developed.  HENT:     Head: Normocephalic.     Comments: Regular vertical  laceration mid forehead between the eyebrows    Right Ear: External ear normal.     Left Ear: External ear normal.  Eyes:     General: No scleral icterus.       Right eye: No discharge.        Left eye: No discharge.     Conjunctiva/sclera: Conjunctivae normal.  Neck:     Musculoskeletal: Neck supple.     Trachea: No tracheal deviation.  Cardiovascular:     Rate and Rhythm: Normal rate and regular rhythm.  Pulmonary:     Effort: Pulmonary effort is normal. No respiratory distress.     Breath sounds: Normal breath sounds. No stridor. No wheezing or rales.  Abdominal:     General: Bowel sounds are normal. There is no distension.     Palpations: Abdomen is soft.     Tenderness: There is no abdominal tenderness. There is no guarding or rebound.  Musculoskeletal:        General: No tenderness.     Right shoulder: He exhibits no tenderness, no bony tenderness and no swelling.     Left shoulder: He exhibits no tenderness, no bony tenderness and no swelling.     Right wrist: He exhibits no tenderness, no bony tenderness and no swelling.     Left wrist: He exhibits no tenderness, no bony tenderness and no swelling.     Right hip: He exhibits normal range of motion, no tenderness, no bony  tenderness and no swelling.     Left hip: He exhibits normal range of motion, no tenderness and no bony tenderness.     Right ankle: He exhibits no swelling. No tenderness.     Left ankle: He exhibits no swelling. No tenderness.     Cervical back: He exhibits no tenderness, no bony tenderness and no swelling.     Thoracic back: He exhibits no tenderness, no bony tenderness and no swelling.     Lumbar back: He exhibits no tenderness, no bony tenderness and no swelling.  Skin:    General: Skin is warm and dry.     Findings: No rash.  Neurological:     Mental Status: He is alert.     Cranial Nerves: No cranial nerve deficit (no facial droop, extraocular movements intact, no slurred speech).     Sensory: No sensory deficit.     Motor: No abnormal muscle tone or seizure activity.     Coordination: Coordination normal.      ED Treatments / Results  Labs (all labs ordered are listed, but only abnormal results are displayed) Labs Reviewed  CBC - Abnormal; Notable for the following components:      Result Value   WBC 10.7 (*)    All other components within normal limits  BASIC METABOLIC PANEL - Abnormal; Notable for the following components:   Glucose, Bld 100 (*)    BUN 40 (*)    Creatinine, Ser 1.37 (*)    GFR calc non Af Amer 53 (*)    All other components within normal limits    EKG EKG Interpretation  Date/Time:  Saturday December 07 2018 09:06:34 EDT Ventricular Rate:  77 PR Interval:    QRS Duration: 131 QT Interval:  381 QTC Calculation: 432 R Axis:   -52 Text Interpretation:  Sinus rhythm RBBB and LAFB Borderline ST elevation, lateral leads No significant change since last tracing Confirmed by Dorie Rank [phone]) on 12/07/2018 9:13:19 AM   Radiology Ct Head Wo Contrast  Result Date: 12/07/2018 CLINICAL DATA:  Status post fall.  Syncope. EXAM: CT HEAD WITHOUT CONTRAST TECHNIQUE: Contiguous axial images were obtained from the base of the skull through the vertex without  intravenous contrast. COMPARISON:  None. FINDINGS: Brain: No evidence of acute infarction, hemorrhage, hydrocephalus, extra-axial collection or mass lesion/mass effect. Vascular: No hyperdense vessel or unexpected calcification. Skull: Normal. Negative for fracture or focal lesion. Orbits: No acute finding. 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 IMPRESSION: 1. Normal brain. 2. Right parietal scalp lipoma. Electronically Signed   By: Kerby Moors M.D.   On: 12/07/2018 09:53    Procedures .Marland KitchenLaceration Repair  Date/Time: 12/07/2018 10:43 AM Performed by: Dorie Rank, MD Authorized by: Dorie Rank, MD   Consent:    Consent obtained:  Verbal   Consent given by:  Patient   Risks discussed:  Infection, need for additional repair, pain, poor cosmetic result and poor wound healing   Alternatives discussed:  No treatment and delayed treatment Universal protocol:    Procedure explained and questions answered to patient or proxy's satisfaction: yes     Relevant documents present and verified: yes     Test results available and properly labeled: yes     Imaging studies available: yes     Required blood products, implants, devices, and special equipment available: yes     Site/side marked: yes     Immediately prior to procedure, a time out was called: yes     Patient identity confirmed:  Verbally with patient Anesthesia (see MAR for exact dosages):    Anesthesia method:  Local infiltration   Local anesthetic:  Lidocaine 1% w/o epi Laceration details:    Location:  Face   Face location:  Forehead   Length (cm):  5 Repair type:    Repair type:  Complex Pre-procedure details:    Preparation:  Patient was prepped and draped in usual sterile fashion Exploration:    Hemostasis achieved with:  Direct pressure   Wound exploration: entire depth of wound probed and visualized     Wound extent: fascia violated and muscle damage     Contaminated: no   Treatment:     Area cleansed with:  Shur-Clens   Amount of cleaning:  Extensive   Irrigation method:  Pressure wash   Visualized foreign bodies/material removed: no     Debridement:  None   Undermining:  None Fascia repair:    Suture size:  4-0   Suture material:  Vicryl   Suture technique:  Simple interrupted   Number of sutures:  4 Skin repair:    Repair method:  Sutures   Suture size:  5-0   Suture material:  Fast-absorbing gut   Suture technique:  Simple interrupted   Number of sutures:  10 Approximation:    Approximation:  Close Post-procedure details:    Dressing:  Antibiotic ointment   Patient tolerance of procedure:  Tolerated well, no immediate complications   (including critical care time)  Medications Ordered in ED Medications  lidocaine (PF) (XYLOCAINE) 1 % injection 30 mL (30 mLs Infiltration Given by Other 12/07/18 0930)  Tdap (BOOSTRIX) injection 0.5 mL (0.5 mLs Intramuscular Given 12/07/18 0929)  bacitracin ointment 1 application (  1 application Topical Given 12/07/18 1046)  sodium chloride 0.9 % bolus 1,000 mL (0 mLs Intravenous Stopped 12/07/18 1135)     Initial Impression / Assessment and Plan / ED Course  I have reviewed the triage vital signs and the nursing notes.  Pertinent labs & imaging results that were available during my care of the patient were reviewed by me and considered in my medical decision making (see chart for details).  Clinical Course as of Dec 07 1321  Sat Dec 07, 2018  2878 BUN and creatinine elevated compared to previous.  Hemoglobin is stable   [JK]  0958 Head CT with scalp lipoma otherwise no acute findings.   [JK]    Clinical Course User Index [JK] Dorie Rank, MD     Patient presented after a syncopal episode.  He has had some diarrhea and abdominal cramping.  I suspect this was vasovagal in nature.  Patient's abdominal exam is benign and he has no tenderness or complaints of pain at this time.  I doubt any serious etiology.    Scalp  laceration repaired.  Pt dc in stable condition.  Final Clinical Impressions(s) / ED Diagnoses   Final diagnoses:  Dehydration  Syncope and collapse  Facial laceration, initial encounter    ED Discharge Orders    None       Dorie Rank, MD 12/08/18 1324

## 2018-12-17 ENCOUNTER — Ambulatory Visit: Payer: Federal, State, Local not specified - PPO | Admitting: Physician Assistant

## 2018-12-20 ENCOUNTER — Telehealth: Payer: Self-pay | Admitting: Physician Assistant

## 2018-12-20 ENCOUNTER — Other Ambulatory Visit: Payer: Self-pay

## 2018-12-20 DIAGNOSIS — F902 Attention-deficit hyperactivity disorder, combined type: Secondary | ICD-10-CM

## 2018-12-20 MED ORDER — AMPHETAMINE-DEXTROAMPHETAMINE 20 MG PO TABS: ORAL_TABLET | ORAL | 0 refills | Status: DC

## 2018-12-20 MED ORDER — ZALEPLON 10 MG PO CAPS: ORAL_CAPSULE | ORAL | 0 refills | Status: DC

## 2018-12-20 NOTE — Telephone Encounter (Signed)
Pt needs refills on zaleplon it will run out in the next few days. Adderall will run out 01/08/19.

## 2018-12-20 NOTE — Telephone Encounter (Signed)
Pharmacy not listed but will submit to [facility]  Last refill sonata 08/03 Last refill adderall 08/17  Has appt 01/20/2019

## 2019-01-20 ENCOUNTER — Encounter: Payer: Self-pay | Admitting: Physician Assistant

## 2019-01-20 ENCOUNTER — Ambulatory Visit (INDEPENDENT_AMBULATORY_CARE_PROVIDER_SITE_OTHER): Payer: Federal, State, Local not specified - PPO | Admitting: Physician Assistant

## 2019-01-20 ENCOUNTER — Other Ambulatory Visit: Payer: Self-pay

## 2019-01-20 DIAGNOSIS — F329 Major depressive disorder, single episode, unspecified: Secondary | ICD-10-CM

## 2019-01-20 DIAGNOSIS — G4700 Insomnia, unspecified: Secondary | ICD-10-CM | POA: Diagnosis not present

## 2019-01-20 DIAGNOSIS — F902 Attention-deficit hyperactivity disorder, combined type: Secondary | ICD-10-CM | POA: Diagnosis not present

## 2019-01-20 DIAGNOSIS — F32A Depression, unspecified: Secondary | ICD-10-CM

## 2019-01-20 MED ORDER — AMPHETAMINE-DEXTROAMPHETAMINE 20 MG PO TABS: 20.0000 mg | ORAL_TABLET | Freq: Three times a day (TID) | ORAL | 0 refills | Status: DC

## 2019-01-20 MED ORDER — VENLAFAXINE HCL ER 75 MG PO CP24: 75.0000 mg | ORAL_CAPSULE | Freq: Every day | ORAL | 1 refills | Status: DC

## 2019-01-20 MED ORDER — ZALEPLON 10 MG PO CAPS: ORAL_CAPSULE | ORAL | 2 refills | Status: DC

## 2019-01-20 NOTE — Progress Notes (Signed)
[facility] Med Check  Patient ID: Gregory Carr,  MRN: [id number]  PCP: Mosie Lukes, MD  Date of Evaluation: 01/20/2019 Time spent:15 minutes  Chief Complaint:  Chief Complaint    Depression; ADHD; Follow-up     Virtual Visit via Telephone Note  I connected with patient by a video enabled telemedicine application or telephone, with their informed consent, and verified patient privacy and that I am speaking with the correct person using two identifiers.  I am private, in my office and the patient is home.  I discussed the limitations, risks, security and privacy concerns of performing an evaluation and management service by telephone and the availability of in person appointments. I also discussed with the patient that there may be a patient responsible charge related to this service. The patient expressed understanding and agreed to proceed.   I discussed the assessment and treatment plan with the patient. The patient was provided an opportunity to ask questions and all were answered. The patient agreed with the plan and demonstrated an understanding of the instructions.   The patient was advised to call back or seek an in-person evaluation if the symptoms worsen or if the condition fails to improve as anticipated.  I provided 15 minutes of non-face-to-face time during this encounter.  HISTORY/CURRENT STATUS: HPI for routine follow-up.  Patient asked about going back on an antidepressant.  States he feels "down" sometimes and he remembers feeling better years ago when he was on something for his "nerves."  He has trouble enjoying things.  Energy and motivation are better with the Adderall.  He feels a lot better being on it.  He is able to complete tasks.  He does not cry easily.  He denies suicidal or homicidal thoughts.  After reviewing his chart, I see that he was on Effexor in the past.  He remembers going off of it after approximately 10 or so years due to sexual side  effects.  He does remember it being ineffective.  He is sleeping much better since we added Sonata.  He really likes that drug.  He denies any side effects from it.  No "hangover" the next day.  Denies dizziness, syncope, seizures, numbness, tingling, tremor, tics, unsteady gait, slurred speech, confusion. Denies muscle or joint pain, stiffness, or dystonia.  Individual Medical History/ Review of Systems: Changes? :No    Past medications for mental health diagnoses include: Ambien, Belsomra, Remeron,Restoril, Serzone, Cymbalta, Trazodone, Effexor XR  Allergies: Patient has no known allergies.  Current Medications:  Current Outpatient Medications:  .  amphetamine-dextroamphetamine (ADDERALL) 20 MG tablet, Take 1 tablet (20 mg total) by mouth 3 (three) times daily., Disp: 90 tablet, Rfl: 0 .  amphetamine-dextroamphetamine (ADDERALL) 20 MG tablet, Take 1 tablet by mouth three times a day, Disp: 90 tablet, Rfl: 0 .  [START ON 04/06/2019] amphetamine-dextroamphetamine (ADDERALL) 20 MG tablet, Take 1 tablet (20 mg total) by mouth 3 (three) times daily., Disp: 90 tablet, Rfl: 0 .  [START ON 03/08/2019] amphetamine-dextroamphetamine (ADDERALL) 20 MG tablet, Take 1 tablet (20 mg total) by mouth 3 (three) times daily., Disp: 90 tablet, Rfl: 0 .  [START ON 02/06/2019] amphetamine-dextroamphetamine (ADDERALL) 20 MG tablet, Take 1 tablet (20 mg total) by mouth 3 (three) times daily., Disp: 90 tablet, Rfl: 0 .  atorvastatin (LIPITOR) 80 MG tablet, Take 1 tablet (80 mg total) by mouth daily., Disp: 90 tablet, Rfl: 3 .  Coenzyme Q10 (COQ-10) 400 MG CAPS, Take 400 mg by mouth daily. Reported on 10/28/2015,  Disp: , Rfl:  .  esomeprazole (NEXIUM) 40 MG capsule, Take 1 capsule (40 mg total) by mouth daily before breakfast., Disp: 90 capsule, Rfl: 3 .  Fiber CAPS, Take by mouth daily. Reported on 10/28/2015, Disp: , Rfl:  .  lisinopril-hydrochlorothiazide (ZESTORETIC) 20-12.5 MG tablet, Take 1 tablet by mouth  daily., Disp: 90 tablet, Rfl: 3 .  morphine (MS CONTIN) 15 MG 12 hr tablet, Take 15 mg by mouth every 12 (twelve) hours., Disp: , Rfl:  .  traZODone (DESYREL) 100 MG tablet, Take 1-2 tablets (100-200 mg total) by mouth at bedtime as needed for sleep., Disp: 180 tablet, Rfl: 1 .  zaleplon (SONATA) 10 MG capsule, Take 1 capsule by mouth at bedtime for sleep, may repeat 1 capsule prn for mid nocturnal awakening with 3 hours left to sleep, Disp: 60 capsule, Rfl: 2 .  NUCYNTA ER 100 MG 12 hr tablet, Take 2 tablets by mouth daily., Disp: , Rfl:  .  venlafaxine XR (EFFEXOR XR) 75 MG 24 hr capsule, Take 1 capsule (75 mg total) by mouth daily with breakfast., Disp: 30 capsule, Rfl: 1 Medication Side Effects: none  Family Medical/ Social History: Changes? No  MENTAL HEALTH EXAM:  There were no vitals taken for this visit.There is no height or weight on file to calculate BMI.  General Appearance: unable to assess  Eye Contact:  unable to assess  Speech:  Clear and Coherent  Volume:  Normal  Mood:  Euthymic  Affect:  unable to assess  Thought Process:  Goal Directed and Descriptions of Associations: Intact  Orientation:  Full (Time, Place, and Person)  Thought Content: Logical   Suicidal Thoughts:  No  Homicidal Thoughts:  No  Memory:  WNL  Judgement:  Good  Insight:  Good  Psychomotor Activity:  unable to assess  Concentration:  Concentration: Good and Attention Span: Good  Recall:  Good  Fund of Knowledge: Good  Language: Good  Assets:  Desire for Improvement  ADL's:  Intact  Cognition: WNL  Prognosis:  Good    DIAGNOSES:    ICD-10-CM   1. Attention deficit hyperactivity disorder (ADHD), combined type  F90.2   2. Insomnia, unspecified type  G47.00   3. Depression, unspecified depression type  F32.9     Receiving Psychotherapy: No    RECOMMENDATIONS:  We discussed restarting Effexor since it was effective.  He would like to try it.  He will let me know if there are sexual side  effects that are intolerable. Start Effexor XR 75 mg every morning. Continue Adderall 20 mg 3 times daily. Continue Sonata 10 mg nightly as needed and 1 for mid nocturnal awakening with at least 3 hours left to sleep, also as needed. Return in approximately 6 weeks.  Donnal Moat, PA-C

## 2019-01-29 ENCOUNTER — Other Ambulatory Visit: Payer: Self-pay | Admitting: Physician Assistant

## 2019-01-29 ENCOUNTER — Other Ambulatory Visit: Payer: Self-pay | Admitting: Family Medicine

## 2019-03-10 ENCOUNTER — Ambulatory Visit (INDEPENDENT_AMBULATORY_CARE_PROVIDER_SITE_OTHER): Payer: Federal, State, Local not specified - PPO | Admitting: Physician Assistant

## 2019-03-10 ENCOUNTER — Other Ambulatory Visit: Payer: Self-pay

## 2019-03-10 ENCOUNTER — Encounter: Payer: Self-pay | Admitting: Physician Assistant

## 2019-03-10 DIAGNOSIS — G4700 Insomnia, unspecified: Secondary | ICD-10-CM | POA: Diagnosis not present

## 2019-03-10 DIAGNOSIS — F902 Attention-deficit hyperactivity disorder, combined type: Secondary | ICD-10-CM | POA: Diagnosis not present

## 2019-03-10 NOTE — Progress Notes (Signed)
[facility]  Patient ID: Gregory Carr,  MRN: [id number]  PCP: Mosie Lukes, MD  Date of Evaluation: 03/10/2019 Time spent:15 minutes  Chief Complaint:  Chief Complaint    Follow-up     Virtual Visit via Telephone Note  I connected with patient by a video enabled telemedicine application or telephone, with their informed consent, and verified patient privacy and that I am speaking with the correct person using two identifiers.  I am private, in my office and the patient is at home.   I discussed the limitations, risks, security and privacy concerns of performing an evaluation and management service by telephone and the availability of in person appointments. I also discussed with the patient that there may be a patient responsible charge related to this service. The patient expressed understanding and agreed to proceed.   I discussed the assessment and treatment plan with the patient. The patient was provided an opportunity to ask questions and all were answered. The patient agreed with the plan and demonstrated an understanding of the instructions.   The patient was advised to call back or seek an in-person evaluation if the symptoms worsen or if the condition fails to improve as anticipated.  I provided 15 minutes of non-face-to-face time during this encounter.  HISTORY/CURRENT STATUS: HPI for routine med check.  He stopped the Effexor b/c it made him feel sluggish.  He did not like the way it made him feel so he stopped it.  He had no withdrawals at all.  We had initially put him back on that because he had success in the past.  He was having increased irritability and anger issues.  He was not noticing that but someone he knows told him.  He feels like he is doing okay without being on anything for his mood.  He is able to enjoy things, energy and motivation are good, he is not isolating, denies suicidal or homicidal thoughts.  States that attention is good  without easy distractibility.  Able to focus on things and finish tasks to completion.   States the [employer] is working well for sleep.  He does not always take 2 pills during the night, sometimes he can just get away with 1 at bedtime.  Denies dizziness, syncope, seizures, numbness, tingling, tremor, tics, unsteady gait, slurred speech, confusion. Denies muscle or joint pain, stiffness, or dystonia.  Individual Medical History/ Review of Systems: Changes? :No    Past medications for mental health diagnoses include: Ambien, Belsomra, Remeron,Restoril, Serzone, Cymbalta, Trazodone, Effexor XR  Allergies: Patient has no known allergies.  Current Medications:  Current Outpatient Medications:  .  amphetamine-dextroamphetamine (ADDERALL) 20 MG tablet, Take 1 tablet (20 mg total) by mouth 3 (three) times daily., Disp: 90 tablet, Rfl: 0 .  amphetamine-dextroamphetamine (ADDERALL) 20 MG tablet, Take 1 tablet by mouth three times a day, Disp: 90 tablet, Rfl: 0 .  [START ON 04/06/2019] amphetamine-dextroamphetamine (ADDERALL) 20 MG tablet, Take 1 tablet (20 mg total) by mouth 3 (three) times daily., Disp: 90 tablet, Rfl: 0 .  amphetamine-dextroamphetamine (ADDERALL) 20 MG tablet, Take 1 tablet (20 mg total) by mouth 3 (three) times daily., Disp: 90 tablet, Rfl: 0 .  amphetamine-dextroamphetamine (ADDERALL) 20 MG tablet, Take 1 tablet (20 mg total) by mouth 3 (three) times daily., Disp: 90 tablet, Rfl: 0 .  atorvastatin (LIPITOR) 80 MG tablet, TAKE 1 TABLET BY MOUTH EVERY DAY, Disp: 90 tablet, Rfl: 0 .  Coenzyme Q10 (COQ-10) 400 MG CAPS, Take 400 mg  by mouth daily. Reported on 10/28/2015, Disp: , Rfl:  .  esomeprazole (NEXIUM) 40 MG capsule, Take 1 capsule (40 mg total) by mouth daily before breakfast., Disp: 90 capsule, Rfl: 3 .  Fiber CAPS, Take by mouth daily. Reported on 10/28/2015, Disp: , Rfl:  .  lisinopril-hydrochlorothiazide (ZESTORETIC) 20-12.5 MG tablet, Take 1 tablet by mouth daily., Disp: 90  tablet, Rfl: 3 .  morphine (MS CONTIN) 15 MG 12 hr tablet, Take 15 mg by mouth every 12 (twelve) hours., Disp: , Rfl:  .  traZODone (DESYREL) 100 MG tablet, TAKE 1 TO 2 TABLETS        (100-200MG  TOTAL) AT       BEDTIME AS NEEDED FOR SLEEP, Disp: 180 tablet, Rfl: 1 .  zaleplon (SONATA) 10 MG capsule, Take 1 capsule by mouth at bedtime for sleep, may repeat 1 capsule prn for mid nocturnal awakening with 3 hours left to sleep, Disp: 60 capsule, Rfl: 2 .  NUCYNTA ER 100 MG 12 hr tablet, Take 2 tablets by mouth daily., Disp: , Rfl:  Medication Side Effects: none  Family Medical/ Social History: Changes? No  MENTAL HEALTH EXAM:  There were no vitals taken for this visit.There is no height or weight on file to calculate BMI.  General Appearance: Unable to assess  Eye Contact:  Unable to assess  Speech:  Clear and Coherent  Volume:  Normal  Mood:  Euthymic  Affect:  Unable to assess  Thought Process:  Goal Directed  Orientation:  Full (Time, Place, and Person)  Thought Content: Logical   Suicidal Thoughts:  No  Homicidal Thoughts:  No  Memory:  WNL  Judgement:  Good  Insight:  Good  Psychomotor Activity:  Unable to assess  Concentration:  Concentration: Good and Attention Span: Good  Recall:  Good  Fund of Knowledge: Good  Language: Good  Assets:  Desire for Improvement  ADL's:  Intact  Cognition: WNL  Prognosis:  Good    DIAGNOSES:    ICD-10-CM   1. Attention deficit hyperactivity disorder (ADHD), combined type  F90.2   2. Insomnia, unspecified type  G47.00     Receiving Psychotherapy: No    RECOMMENDATIONS:  We discussed the fact that he may not need the Effexor or anything for his mood.  He wants to think about it before starting anything else.  He does not feel like he is irritable or snappy.  I have asked him to keep a mood diary and bring it at the next visit. Continue Adderall 20 mg, 1 p.o. 3 times daily. Continue trazodone 100 mg, 1-2 nightly as needed. Continue  Sonata 10 mg, 1 p.o. nightly as needed and may repeat for mid nocturnal awakening as long as he has 3 hours left to sleep, as needed.  If trazodone works then he should not use this drug. Return in 8 weeks.  Donnal Moat, PA-C

## 2019-04-28 ENCOUNTER — Encounter: Payer: Self-pay | Admitting: Physician Assistant

## 2019-04-28 ENCOUNTER — Ambulatory Visit (INDEPENDENT_AMBULATORY_CARE_PROVIDER_SITE_OTHER): Payer: Federal, State, Local not specified - PPO | Admitting: Physician Assistant

## 2019-04-28 DIAGNOSIS — G4700 Insomnia, unspecified: Secondary | ICD-10-CM

## 2019-04-28 DIAGNOSIS — F329 Major depressive disorder, single episode, unspecified: Secondary | ICD-10-CM

## 2019-04-28 DIAGNOSIS — F902 Attention-deficit hyperactivity disorder, combined type: Secondary | ICD-10-CM

## 2019-04-28 DIAGNOSIS — F32A Depression, unspecified: Secondary | ICD-10-CM

## 2019-04-28 MED ORDER — AMPHETAMINE-DEXTROAMPHETAMINE 20 MG PO TABS: 20.0000 mg | ORAL_TABLET | Freq: Three times a day (TID) | ORAL | 0 refills | Status: DC

## 2019-04-28 MED ORDER — ZALEPLON 10 MG PO CAPS: ORAL_CAPSULE | ORAL | 2 refills | Status: DC

## 2019-04-28 NOTE — Progress Notes (Signed)
[facility]  Patient ID: Gregory Carr,  MRN: [id number]  PCP: Mosie Lukes, MD  Date of Evaluation: 04/28/2019 Time spent:15 minutes  Chief Complaint:  Chief Complaint    Anxiety; Depression; Follow-up     Virtual Visit via Telephone Note  I connected with patient by a video enabled telemedicine application or telephone, with their informed consent, and verified patient privacy and that I am speaking with the correct person using two identifiers.  I am private, in my office and the patient is home.   I discussed the limitations, risks, security and privacy concerns of performing an evaluation and management service by telephone and the availability of in person appointments. I also discussed with the patient that there may be a patient responsible charge related to this service. The patient expressed understanding and agreed to proceed.   I discussed the assessment and treatment plan with the patient. The patient was provided an opportunity to ask questions and all were answered. The patient agreed with the plan and demonstrated an understanding of the instructions.   The patient was advised to call back or seek an in-person evaluation if the symptoms worsen or if the condition fails to improve as anticipated.  I provided 15 minutes of non-face-to-face time during this encounter.  HISTORY/CURRENT STATUS: HPI For routine med check.  Patient states he is doing really well.  He likes the medications just as they are.  He sleeps well on the Sonata and states he is not having the hangover effect like he has with other drugs.  Of course he sometimes only uses the trazodone.  His mood is good.  He is not having mood swings at all.  He is able to enjoy things.  In fact he had been out working in the yard before our phone appointment.  Energy and motivation are good.  He denies suicidal or homicidal thoughts.  He is able to focus and complete tasks.  The Adderall has been  really helpful and that respect but also has given him energy and motivation.  He denies increased energy with decreased need for sleep.  No impulsivity, risky behavior, increased libido, or increased spending.  No grandiosity.  No hallucinations.  Not having any anxiety to speak of.  He does not feel like he needs to go back on Effexor or anything else for mood.  Denies dizziness, syncope, seizures, numbness, tingling, tremor, tics, unsteady gait, slurred speech, confusion. Denies muscle or joint pain, stiffness, or dystonia.  Individual Medical History/ Review of Systems: Changes? :No    Past medications for mental health diagnoses include: Ambien, Belsomra, Remeron,Restoril, Serzone, Cymbalta, Trazodone, Effexor XR  Allergies: Patient has no known allergies.  Current Medications:  Current Outpatient Medications:  .  amphetamine-dextroamphetamine (ADDERALL) 20 MG tablet, Take 1 tablet (20 mg total) by mouth 3 (three) times daily., Disp: 90 tablet, Rfl: 0 .  amphetamine-dextroamphetamine (ADDERALL) 20 MG tablet, Take 1 tablet (20 mg total) by mouth 3 (three) times daily., Disp: 90 tablet, Rfl: 0 .  [START ON 07/03/2019] amphetamine-dextroamphetamine (ADDERALL) 20 MG tablet, Take 1 tablet (20 mg total) by mouth 3 (three) times daily., Disp: 90 tablet, Rfl: 0 .  [START ON 06/06/2019] amphetamine-dextroamphetamine (ADDERALL) 20 MG tablet, Take 1 tablet (20 mg total) by mouth 3 (three) times daily., Disp: 90 tablet, Rfl: 0 .  [START ON 05/07/2019] amphetamine-dextroamphetamine (ADDERALL) 20 MG tablet, Take 1 tablet (20 mg total) by mouth 3 (three) times daily. Take 1 tablet by mouth three  times a day, Disp: 90 tablet, Rfl: 0 .  atorvastatin (LIPITOR) 80 MG tablet, TAKE 1 TABLET BY MOUTH EVERY DAY, Disp: 90 tablet, Rfl: 0 .  Coenzyme Q10 (COQ-10) 400 MG CAPS, Take 400 mg by mouth daily. Reported on 10/28/2015, Disp: , Rfl:  .  esomeprazole (NEXIUM) 40 MG capsule, Take 1 capsule (40 mg total) by mouth  daily before breakfast., Disp: 90 capsule, Rfl: 3 .  Fiber CAPS, Take by mouth daily. Reported on 10/28/2015, Disp: , Rfl:  .  lisinopril-hydrochlorothiazide (ZESTORETIC) 20-12.5 MG tablet, Take 1 tablet by mouth daily., Disp: 90 tablet, Rfl: 3 .  morphine (MS CONTIN) 15 MG 12 hr tablet, Take 15 mg by mouth every 12 (twelve) hours., Disp: , Rfl:  .  traZODone (DESYREL) 100 MG tablet, TAKE 1 TO 2 TABLETS        (100-200MG  TOTAL) AT       BEDTIME AS NEEDED FOR SLEEP, Disp: 180 tablet, Rfl: 1 .  zaleplon (SONATA) 10 MG capsule, Take 1 capsule by mouth at bedtime for sleep, may repeat 1 capsule prn for mid nocturnal awakening with 3 hours left to sleep, Disp: 60 capsule, Rfl: 2 .  NUCYNTA ER 100 MG 12 hr tablet, Take 2 tablets by mouth daily., Disp: , Rfl:  Medication Side Effects: none  Family Medical/ Social History: Changes? No  MENTAL HEALTH EXAM:  There were no vitals taken for this visit.There is no height or weight on file to calculate BMI.  General Appearance: Unable to assess  Eye Contact:  Unable to assess  Speech:  Clear and Coherent  Volume:  Normal  Mood:  Euthymic  Affect:  Unable to assess  Thought Process:  Goal Directed and Descriptions of Associations: Intact  Orientation:  Full (Time, Place, and Person)  Thought Content: Logical   Suicidal Thoughts:  No  Homicidal Thoughts:  No  Memory:  WNL  Judgement:  Good  Insight:  Good  Psychomotor Activity:  Unable to assess  Concentration:  Concentration: Good and Attention Span: Good  Recall:  Good  Fund of Knowledge: Good  Language: Good  Assets:  Desire for Improvement  ADL's:  Intact  Cognition: WNL  Prognosis:  Good    DIAGNOSES:    ICD-10-CM   1. Depression, unspecified depression type  F32.9   2. Attention deficit hyperactivity disorder (ADHD), combined type  F90.2 amphetamine-dextroamphetamine (ADDERALL) 20 MG tablet    amphetamine-dextroamphetamine (ADDERALL) 20 MG tablet  3. Insomnia, unspecified type  G47.00      Receiving Psychotherapy: No    RECOMMENDATIONS:  I am glad to see him doing so well. Continue Adderall 20 mg, 1 p.o. 3 times daily. Continue trazodone 100 mg, 1-2 nightly as needed. Continue Sonata 10 mg, 1 nightly as needed sleep and may repeat 1 as needed for mid nocturnal awakening as long as he has at least 3 hours left to sleep. Return in 3 months.  Donnal Moat, PA-C

## 2019-05-02 ENCOUNTER — Other Ambulatory Visit: Payer: Self-pay | Admitting: Family Medicine

## 2019-05-06 ENCOUNTER — Other Ambulatory Visit: Payer: Self-pay

## 2019-05-06 ENCOUNTER — Telehealth: Payer: Self-pay | Admitting: Physician Assistant

## 2019-05-06 DIAGNOSIS — F902 Attention-deficit hyperactivity disorder, combined type: Secondary | ICD-10-CM

## 2019-05-06 MED ORDER — AMPHETAMINE-DEXTROAMPHETAMINE 20 MG PO TABS: 20.0000 mg | ORAL_TABLET | Freq: Three times a day (TID) | ORAL | 0 refills | Status: DC

## 2019-05-06 NOTE — Telephone Encounter (Signed)
The [facility] [address] does not have the Adderall 20mg  in stock. Please switch it to the pharmacy, [place] on [address] For pt to pick up.

## 2019-05-08 NOTE — Telephone Encounter (Signed)
reviewed

## 2019-06-05 ENCOUNTER — Telehealth: Payer: Self-pay

## 2019-06-05 NOTE — Telephone Encounter (Signed)
Prior authorization submitted for Amphetamine-Dextroamphetamine 20 mg #90/30 day through [place] effective 05/05/2019-06/03/2020   Submitted through cover my meds

## 2019-07-28 ENCOUNTER — Ambulatory Visit (INDEPENDENT_AMBULATORY_CARE_PROVIDER_SITE_OTHER): Payer: Federal, State, Local not specified - PPO | Admitting: Physician Assistant

## 2019-07-28 ENCOUNTER — Encounter: Payer: Self-pay | Admitting: Physician Assistant

## 2019-07-28 DIAGNOSIS — F902 Attention-deficit hyperactivity disorder, combined type: Secondary | ICD-10-CM | POA: Diagnosis not present

## 2019-07-28 DIAGNOSIS — F32A Depression, unspecified: Secondary | ICD-10-CM

## 2019-07-28 DIAGNOSIS — F329 Major depressive disorder, single episode, unspecified: Secondary | ICD-10-CM

## 2019-07-28 DIAGNOSIS — G4700 Insomnia, unspecified: Secondary | ICD-10-CM

## 2019-07-28 MED ORDER — ZALEPLON 10 MG PO CAPS: ORAL_CAPSULE | ORAL | 2 refills | Status: DC

## 2019-07-28 MED ORDER — AMPHETAMINE-DEXTROAMPHETAMINE 20 MG PO TABS: 20.0000 mg | ORAL_TABLET | Freq: Three times a day (TID) | ORAL | 0 refills | Status: DC

## 2019-07-28 NOTE — Progress Notes (Signed)
[facility]  Patient ID: Gregory Carr,  MRN: [id number]  PCP: Mosie Lukes, MD  Date of Evaluation: 07/28/2019 Time spent:20 minutes  Chief Complaint:  Chief Complaint    Depression; Insomnia     Virtual Visit via Telephone Note  I connected with patient by a video enabled telemedicine application or telephone, with their informed consent, and verified patient privacy and that I am speaking with the correct person using two identifiers.  I am private, in my office and the patient is home.  I discussed the limitations, risks, security and privacy concerns of performing an evaluation and management service by telephone and the availability of in person appointments. I also discussed with the patient that there may be a patient responsible charge related to this service. The patient expressed understanding and agreed to proceed.   I discussed the assessment and treatment plan with the patient. The patient was provided an opportunity to ask questions and all were answered. The patient agreed with the plan and demonstrated an understanding of the instructions.   The patient was advised to call back or seek an in-person evaluation if the symptoms worsen or if the condition fails to improve as anticipated.  I provided 20 minutes of non-face-to-face time during this encounter.  HISTORY/CURRENT STATUS: HPI routine med check.  Last visit was 3 months ago.  Since then, [name] states he has been doing well.  He sleeps good with the Sonata and sometimes the trazodone.  He does not have to take it every night.  He was waking up quite a bit during the middle of the night and having the Sonata to take at that time is helpful.  He wakes up feeling rested most of the time.  States he is able to focus well and complete tasks.  Energy and motivation are good also with the Adderall.  Patient denies loss of interest in usual activities and is able to enjoy things.  Denies decreased energy  or motivation.  Appetite has not changed.  No extreme sadness, tearfulness, or feelings of hopelessness.  Denies any changes in concentration, making decisions or remembering things.  Denies suicidal or homicidal thoughts.  Denies dizziness, syncope, seizures, numbness, tingling, tremor, tics, unsteady gait, slurred speech, confusion. Denies muscle or joint pain, stiffness, or dystonia.  Individual Medical History/ Review of Systems: Changes? :No    Past medications for mental health diagnoses include: Ambien, Belsomra, Remeron,Restoril, Serzone, Cymbalta, Trazodone, Effexor XR  Allergies: Patient has no known allergies.  Current Medications:  Current Outpatient Medications:  .  [START ON 10/02/2019] amphetamine-dextroamphetamine (ADDERALL) 20 MG tablet, Take 1 tablet (20 mg total) by mouth 3 (three) times daily., Disp: 90 tablet, Rfl: 0 .  [START ON 09/02/2019] amphetamine-dextroamphetamine (ADDERALL) 20 MG tablet, Take 1 tablet (20 mg total) by mouth 3 (three) times daily., Disp: 90 tablet, Rfl: 0 .  [START ON 08/04/2019] amphetamine-dextroamphetamine (ADDERALL) 20 MG tablet, Take 1 tablet (20 mg total) by mouth 3 (three) times daily., Disp: 90 tablet, Rfl: 0 .  atorvastatin (LIPITOR) 80 MG tablet, TAKE 1 TABLET BY MOUTH EVERY DAY, Disp: 90 tablet, Rfl: 0 .  Coenzyme Q10 (COQ-10) 400 MG CAPS, Take 400 mg by mouth daily. Reported on 10/28/2015, Disp: , Rfl:  .  esomeprazole (NEXIUM) 40 MG capsule, Take 1 capsule (40 mg total) by mouth daily before breakfast., Disp: 90 capsule, Rfl: 3 .  Fiber CAPS, Take by mouth daily. Reported on 10/28/2015, Disp: , Rfl:  .  lisinopril-hydrochlorothiazide (ZESTORETIC)  20-12.5 MG tablet, Take 1 tablet by mouth daily., Disp: 90 tablet, Rfl: 3 .  morphine (MS CONTIN) 15 MG 12 hr tablet, Take 15 mg by mouth every 12 (twelve) hours., Disp: , Rfl:  .  traZODone (DESYREL) 100 MG tablet, TAKE 1 TO 2 TABLETS        (100-200MG  TOTAL) AT       BEDTIME AS NEEDED FOR SLEEP,  Disp: 180 tablet, Rfl: 1 .  zaleplon (SONATA) 10 MG capsule, Take 1 capsule by mouth at bedtime for sleep, may repeat 1 capsule prn for mid nocturnal awakening with 3 hours left to sleep, Disp: 60 capsule, Rfl: 2 .  NUCYNTA ER 100 MG 12 hr tablet, Take 2 tablets by mouth daily., Disp: , Rfl:  Medication Side Effects: none  Family Medical/ Social History: Changes? No  MENTAL HEALTH EXAM:  There were no vitals taken for this visit.There is no height or weight on file to calculate BMI.  General Appearance: Unable to assess  Eye Contact:  Unable to assess  Speech:  Clear and Coherent and Normal Rate  Volume:  Normal  Mood:  Euthymic  Affect:  Unable to assess  Thought Process:  Goal Directed and Descriptions of Associations: Intact  Orientation:  Full (Time, Place, and Person)  Thought Content: Logical   Suicidal Thoughts:  No  Homicidal Thoughts:  No  Memory:  WNL  Judgement:  Good  Insight:  Good  Psychomotor Activity:  Normal  Concentration:  Concentration: Good and Attention Span: Good  Recall:  Good  Fund of Knowledge: Good  Language: Good  Assets:  Desire for Improvement  ADL's:  Intact  Cognition: WNL  Prognosis:  Good    DIAGNOSES:    ICD-10-CM   1. Attention deficit hyperactivity disorder (ADHD), combined type  F90.2   2. Depression, unspecified depression type  F32.9   3. Insomnia, unspecified type  G47.00     Receiving Psychotherapy: No    RECOMMENDATIONS:  PDMP was reviewed. I spent 20 minutes with him.  Continue Adderall 20 mg, 1 p.o. 3 times daily. Continue trazodone 100 mg, 1-2 nightly as needed sleep. Continue Sonata 10 mg, 1 nightly as needed and may repeat 1 for mid nocturnal awakening as long as he has 3 hours or more to sleep.  All as needed. Return in 3 months.  Donnal Moat, PA-C

## 2019-07-30 ENCOUNTER — Other Ambulatory Visit: Payer: Self-pay | Admitting: Family Medicine

## 2019-09-05 ENCOUNTER — Other Ambulatory Visit: Payer: Self-pay

## 2019-09-05 ENCOUNTER — Telehealth: Payer: Self-pay | Admitting: Psychiatry

## 2019-09-05 MED ORDER — TRAZODONE HCL 100 MG PO TABS: ORAL_TABLET | ORAL | 0 refills | Status: DC

## 2019-09-05 NOTE — Telephone Encounter (Signed)
Rx for Trazodone submitted to [facility].

## 2019-09-05 NOTE — Telephone Encounter (Signed)
Pt is needing RF before the weekend as he will run out in two days. Please check for RF from [facility]. I notified him to go ahead and call in the RF to them.

## 2019-09-08 ENCOUNTER — Other Ambulatory Visit: Payer: Self-pay | Admitting: *Deleted

## 2019-09-08 MED ORDER — TRAZODONE HCL 100 MG PO TABS: ORAL_TABLET | ORAL | 0 refills | Status: DC

## 2019-10-09 ENCOUNTER — Telehealth: Payer: Self-pay

## 2019-10-09 NOTE — Telephone Encounter (Signed)
Prior authorization submitted and approved for ZALEPLON 10 MG #60 for 30 day effective 09/09/2019-10/08/2020 with [employer] F[employer].   Patient notified.

## 2019-10-23 ENCOUNTER — Telehealth: Payer: Self-pay | Admitting: Physician Assistant

## 2019-10-23 ENCOUNTER — Telehealth (INDEPENDENT_AMBULATORY_CARE_PROVIDER_SITE_OTHER): Payer: Federal, State, Local not specified - PPO | Admitting: Physician Assistant

## 2019-10-23 ENCOUNTER — Encounter: Payer: Self-pay | Admitting: Physician Assistant

## 2019-10-23 DIAGNOSIS — G4700 Insomnia, unspecified: Secondary | ICD-10-CM | POA: Diagnosis not present

## 2019-10-23 DIAGNOSIS — F902 Attention-deficit hyperactivity disorder, combined type: Secondary | ICD-10-CM

## 2019-10-23 DIAGNOSIS — F329 Major depressive disorder, single episode, unspecified: Secondary | ICD-10-CM

## 2019-10-23 DIAGNOSIS — F32A Depression, unspecified: Secondary | ICD-10-CM

## 2019-10-23 MED ORDER — AMPHETAMINE-DEXTROAMPHETAMINE 20 MG PO TABS: 20.0000 mg | ORAL_TABLET | Freq: Three times a day (TID) | ORAL | 0 refills | Status: DC

## 2019-10-23 MED ORDER — TRAZODONE HCL 100 MG PO TABS: ORAL_TABLET | ORAL | 0 refills | Status: DC

## 2019-10-23 MED ORDER — ZALEPLON 10 MG PO CAPS: ORAL_CAPSULE | ORAL | 2 refills | Status: DC

## 2019-10-23 NOTE — Telephone Encounter (Signed)
Mr. Gregory Carr, Gregory Carr are scheduled for a virtual visit with your provider today.    Just as we do with appointments in the office, we must obtain your consent to participate.  Your consent will be active for this visit and any virtual visit you may have with one of our providers in the next 365 days.    If you have a MyChart account, I can also send a copy of this consent to you electronically.  All virtual visits are billed to your insurance company just like a traditional visit in the office.  As this is a virtual visit, video technology does not allow for your provider to perform a traditional examination.  This may limit your provider's ability to fully assess your condition.  If your provider identifies any concerns that need to be evaluated in person or the need to arrange testing such as labs, EKG, etc, we will make arrangements to do so.    Although advances in technology are sophisticated, we cannot ensure that it will always work on either your end or our end.  If the connection with a video visit is poor, we may have to switch to a telephone visit.  With either a video or telephone visit, we are not always able to ensure that we have a secure connection.   I need to obtain your verbal consent now.   Are you willing to proceed with your visit today?   Gregory Carr has provided verbal consent on 10/23/2019 for a virtual visit (video or telephone).   Donnal Moat, PA-C 10/23/2019  3:06 PM

## 2019-10-23 NOTE — Progress Notes (Signed)
[facility]  Patient ID: Gregory Carr,  MRN: [id number]  PCP: Mosie Lukes, MD  Date of Evaluation: 10/23/2019 Time spent:20 minutes  Chief Complaint:  Chief Complaint    Insomnia; ADD     Virtual Visit via Telephone or Video Note  I connected with patient by a video enabled telemedicine application or telephone, with their informed consent, and verified patient privacy and that I am speaking with the correct person using two identifiers.  I am private, in my office and the patient is home.  I discussed the limitations, risks, security and privacy concerns of performing an evaluation and management service by video and the availability of in person appointments. I also discussed with the patient that there may be a patient responsible charge related to this service. The patient expressed understanding and agreed to proceed.   I discussed the assessment and treatment plan with the patient. The patient was provided an opportunity to ask questions and all were answered. The patient agreed with the plan and demonstrated an understanding of the instructions.   The patient was advised to call back or seek an in-person evaluation if the symptoms worsen or if the condition fails to improve as anticipated.  I provided 20 minutes of non-face-to-face time during this encounter.  HISTORY/CURRENT STATUS: HPI routine med check.  Doing well.  No problems with mood.  He is able to enjoy things.  Energy and motivation are good.  Sleeps well as long as he takes the [employer].  Denies suicidal or homicidal thoughts.  States he is able to focus well and complete tasks.  Energy and motivation are good also with the Adderall.  Patient denies loss of interest in usual activities and is able to enjoy things.  Denies decreased energy or motivation.  Appetite has not changed.  No extreme sadness, tearfulness, or feelings of hopelessness.  Denies any changes in concentration, making decisions or  remembering things.  Denies suicidal or homicidal thoughts.  Denies dizziness, syncope, seizures, numbness, tingling, tremor, tics, unsteady gait, slurred speech, confusion. Denies muscle or joint pain, stiffness, or dystonia.  Individual Medical History/ Review of Systems: Changes? :No    Past medications for mental health diagnoses include: Ambien, Belsomra, Remeron,Restoril, Serzone, Cymbalta, Trazodone, Effexor XR  Allergies: Patient has no known allergies.  Current Medications:  Current Outpatient Medications:    [START ON 12/30/2019] amphetamine-dextroamphetamine (ADDERALL) 20 MG tablet, Take 1 tablet (20 mg total) by mouth 3 (three) times daily., Disp: 90 tablet, Rfl: 0   [START ON 11/30/2019] amphetamine-dextroamphetamine (ADDERALL) 20 MG tablet, Take 1 tablet (20 mg total) by mouth 3 (three) times daily., Disp: 90 tablet, Rfl: 0   [START ON 10/31/2019] amphetamine-dextroamphetamine (ADDERALL) 20 MG tablet, Take 1 tablet (20 mg total) by mouth 3 (three) times daily., Disp: 90 tablet, Rfl: 0   atorvastatin (LIPITOR) 80 MG tablet, Take 1 tablet (80 mg total) by mouth daily., Disp: 90 tablet, Rfl: 0   Coenzyme Q10 (COQ-10) 400 MG CAPS, Take 400 mg by mouth daily. Reported on 10/28/2015, Disp: , Rfl:    esomeprazole (NEXIUM) 40 MG capsule, Take 1 capsule (40 mg total) by mouth daily before breakfast., Disp: 90 capsule, Rfl: 3   Fiber CAPS, Take by mouth daily. Reported on 10/28/2015, Disp: , Rfl:    lisinopril-hydrochlorothiazide (ZESTORETIC) 20-12.5 MG tablet, Take 1 tablet by mouth daily., Disp: 90 tablet, Rfl: 3   morphine (MS CONTIN) 15 MG 12 hr tablet, Take 15 mg by mouth every 12 (twelve)  hours., Disp: , Rfl:    traZODone (DESYREL) 100 MG tablet, TAKE 1 TO 2 TABLETS AT BEDTIME AS NEEDED FOR SLEEP, Disp: 180 tablet, Rfl: 0   zaleplon (SONATA) 10 MG capsule, Take 1 capsule by mouth at bedtime for sleep, may repeat 1 capsule prn for mid nocturnal awakening with 3 hours left to sleep,  Disp: 60 capsule, Rfl: 2   NUCYNTA ER 100 MG 12 hr tablet, Take 2 tablets by mouth daily. (Patient not taking: Reported on 10/23/2019), Disp: , Rfl:  Medication Side Effects: none  Family Medical/ Social History: Changes? No  MENTAL HEALTH EXAM:  There were no vitals taken for this visit.There is no height or weight on file to calculate BMI.  General Appearance: Casual, Neat and Well Groomed  Eye Contact:  Good  Speech:  Clear and Coherent and Normal Rate  Volume:  Normal  Mood:  Euthymic  Affect:  Appropriate  Thought Process:  Goal Directed and Descriptions of Associations: Intact  Orientation:  Full (Time, Place, and Person)  Thought Content: Logical   Suicidal Thoughts:  No  Homicidal Thoughts:  No  Memory:  WNL  Judgement:  Good  Insight:  Good  Psychomotor Activity:  Normal  Concentration:  Concentration: Good and Attention Span: Good  Recall:  Good  Fund of Knowledge: Good  Language: Good  Assets:  Desire for Improvement  ADL's:  Intact  Cognition: WNL  Prognosis:  Good    DIAGNOSES:    ICD-10-CM   1. Attention deficit hyperactivity disorder (ADHD), combined type  F90.2   2. Depression, unspecified depression type  F32.9   3. Insomnia, unspecified type  G47.00     Receiving Psychotherapy: No    RECOMMENDATIONS:  PDMP was reviewed. I provided 20 minutes of nonface-to-face care during this encounter. Continue Adderall 20 mg, 1 p.o. 3 times daily. Continue trazodone 100 mg, 1-2 nightly as needed sleep. Continue Sonata 10 mg, 1 nightly as needed and may repeat 1 for mid nocturnal awakening as long as he has 3 hours or more to sleep.  All as needed. Return in 6 months.  Donnal Moat, PA-C

## 2019-10-25 ENCOUNTER — Other Ambulatory Visit: Payer: Self-pay | Admitting: Family Medicine

## 2019-11-17 ENCOUNTER — Other Ambulatory Visit: Payer: Self-pay | Admitting: Physician Assistant

## 2019-11-25 ENCOUNTER — Other Ambulatory Visit: Payer: Self-pay | Admitting: Family Medicine

## 2019-11-29 ENCOUNTER — Other Ambulatory Visit: Payer: Self-pay | Admitting: Family Medicine

## 2019-12-01 ENCOUNTER — Other Ambulatory Visit: Payer: Self-pay | Admitting: Family Medicine

## 2019-12-01 DIAGNOSIS — I10 Essential (primary) hypertension: Secondary | ICD-10-CM

## 2019-12-02 ENCOUNTER — Other Ambulatory Visit: Payer: Self-pay | Admitting: Family Medicine

## 2019-12-02 DIAGNOSIS — I10 Essential (primary) hypertension: Secondary | ICD-10-CM

## 2019-12-02 MED ORDER — LISINOPRIL-HYDROCHLOROTHIAZIDE 20-12.5 MG PO TABS: 1.0000 | ORAL_TABLET | Freq: Every day | ORAL | 3 refills | Status: DC

## 2019-12-02 NOTE — Telephone Encounter (Signed)
Medication: lisinopril-hydrochlorothiazide (ZESTORETIC) 20-12.5 MG tablet  Has the patient contacted their pharmacy? No. (If no, request that the patient contact the pharmacy for the refill.) (If yes, when and what did the pharmacy advise?)  Preferred Pharmacy (with phone number or street name): [place], [place].  [address] [name] [address]  Phone:  [phone] Fax:  [phone]  DEA #:  --  Agent: Please be advised that RX refills may take up to 3 business days. We ask that you follow-up with your pharmacy.

## 2019-12-02 NOTE — Telephone Encounter (Signed)
Rx sent 

## 2019-12-31 ENCOUNTER — Other Ambulatory Visit: Payer: Self-pay | Admitting: Family Medicine

## 2020-01-06 ENCOUNTER — Other Ambulatory Visit: Payer: Self-pay

## 2020-01-06 ENCOUNTER — Encounter (HOSPITAL_COMMUNITY): Payer: Self-pay

## 2020-01-06 ENCOUNTER — Emergency Department (HOSPITAL_COMMUNITY)
Admission: EM | Admit: 2020-01-06 | Discharge: 2020-01-06 | Disposition: A | Discharge status: Home / Self Care | Payer: Federal, State, Local not specified - PPO | Attending: Emergency Medicine | Admitting: Emergency Medicine

## 2020-01-06 DIAGNOSIS — R1030 Lower abdominal pain, unspecified: Secondary | ICD-10-CM | POA: Diagnosis not present

## 2020-01-06 DIAGNOSIS — Z5321 Procedure and treatment not carried out due to patient leaving prior to being seen by health care provider: Secondary | ICD-10-CM | POA: Diagnosis not present

## 2020-01-06 DIAGNOSIS — K625 Hemorrhage of anus and rectum: Secondary | ICD-10-CM | POA: Diagnosis not present

## 2020-01-06 LAB — COMPREHENSIVE METABOLIC PANEL
ALT: 27 U/L (ref 0–44)
AST: 25 U/L (ref 15–41)
Albumin: 4.8 g/dL (ref 3.5–5.0)
Alkaline Phosphatase: 78 U/L (ref 38–126)
Anion gap: 10 (ref 5–15)
BUN: 36 mg/dL — ABNORMAL HIGH (ref 8–23)
CO2: 28 mmol/L (ref 22–32)
Calcium: 9.6 mg/dL (ref 8.9–10.3)
Chloride: 97 mmol/L — ABNORMAL LOW (ref 98–111)
Creatinine, Ser: 1.03 mg/dL (ref 0.61–1.24)
GFR calc Af Amer: >60 mL/min (ref >60)
GFR calc non Af Amer: >60 mL/min (ref >60)
Glucose, Bld: 140 mg/dL — ABNORMAL HIGH (ref 70–99)
Potassium: 4 mmol/L (ref 3.5–5.1)
Sodium: 135 mmol/L (ref 135–145)
Total Bilirubin: 1.7 mg/dL — ABNORMAL HIGH (ref 0.3–1.2)
Total Protein: 7.8 g/dL (ref 6.5–8.1)

## 2020-01-06 LAB — CBC
HCT: 47.2 % (ref 39.0–52.0)
Hemoglobin: 16.4 g/dL (ref 13.0–17.0)
MCH: 31.7 pg (ref 26.0–34.0)
MCHC: 34.7 g/dL (ref 30.0–36.0)
MCV: 91.1 fL (ref 80.0–100.0)
Platelets: 206 10*3/uL (ref 150–400)
RBC: 5.18 MIL/uL (ref 4.22–5.81)
RDW: 12.5 % (ref 11.5–15.5)
WBC: 16.9 10*3/uL — ABNORMAL HIGH (ref 4.0–10.5)
nRBC: 0 % (ref 0.0–0.2)

## 2020-01-06 LAB — LIPASE, BLOOD: Lipase: 23 U/L (ref 11–51)

## 2020-01-06 NOTE — ED Triage Notes (Signed)
Pt arrives EMS from home with c/o rectal bleeding starting around 0300 associated with lower abdominal pain.

## 2020-01-11 ENCOUNTER — Other Ambulatory Visit: Payer: Self-pay | Admitting: Family Medicine

## 2020-01-16 ENCOUNTER — Other Ambulatory Visit: Payer: Self-pay | Admitting: Family Medicine

## 2020-01-20 ENCOUNTER — Telehealth: Payer: Self-pay | Admitting: Family Medicine

## 2020-01-20 MED ORDER — ATORVASTATIN CALCIUM 80 MG PO TABS
80.0000 mg | ORAL_TABLET | Freq: Every day | ORAL | 0 refills | Status: DC
Start: 2020-01-20 — End: 2020-04-13
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
## 2020-01-20 NOTE — Telephone Encounter (Signed)
Rx sent 

## 2020-01-28 ENCOUNTER — Telehealth: Payer: Self-pay | Admitting: Family Medicine

## 2020-01-28 NOTE — Telephone Encounter (Signed)
Pt came in and dropped of DMV Consent forms to be filed out.  Would like to be called when ready to pick up. [place] put into [place] bin up front

## 2020-01-29 ENCOUNTER — Telehealth: Payer: Self-pay | Admitting: Physician Assistant

## 2020-01-29 NOTE — Telephone Encounter (Signed)
Has office visit in the morning will have paperwork available and ready

## 2020-01-29 NOTE — Telephone Encounter (Signed)
Pt called requesting refill for next 3 months of DEXTROAMPHETAMINE 20 mg 3/d to [facility]. Apt 12/20

## 2020-01-30 ENCOUNTER — Encounter: Payer: Self-pay | Admitting: Family Medicine

## 2020-01-30 ENCOUNTER — Other Ambulatory Visit: Payer: Self-pay

## 2020-01-30 ENCOUNTER — Telehealth (INDEPENDENT_AMBULATORY_CARE_PROVIDER_SITE_OTHER): Payer: Federal, State, Local not specified - PPO | Admitting: Family Medicine

## 2020-01-30 VITALS — Wt 154.0 lb

## 2020-01-30 DIAGNOSIS — E782 Mixed hyperlipidemia: Secondary | ICD-10-CM

## 2020-01-30 DIAGNOSIS — R1032 Left lower quadrant pain: Secondary | ICD-10-CM | POA: Diagnosis not present

## 2020-01-30 DIAGNOSIS — I10 Essential (primary) hypertension: Secondary | ICD-10-CM

## 2020-01-30 DIAGNOSIS — E739 Lactose intolerance, unspecified: Secondary | ICD-10-CM | POA: Diagnosis not present

## 2020-01-30 DIAGNOSIS — G4700 Insomnia, unspecified: Secondary | ICD-10-CM

## 2020-01-30 DIAGNOSIS — R197 Diarrhea, unspecified: Secondary | ICD-10-CM

## 2020-01-30 MED ORDER — AMPHETAMINE-DEXTROAMPHETAMINE 20 MG PO TABS: 20.0000 mg | ORAL_TABLET | Freq: Three times a day (TID) | ORAL | 0 refills | Status: DC
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
## 2020-01-30 NOTE — Assessment & Plan Note (Signed)
Had with with nausea. Abrupt on and off set, resolved likely food bourne pathogen. Will repeat labs including cbc with diff to assess resolution of elevated WBC

## 2020-01-30 NOTE — Assessment & Plan Note (Signed)
Encouraged heart healthy diet, increase exercise, avoid trans fats, consider a krill oil cap daily 
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
## 2020-01-30 NOTE — Assessment & Plan Note (Addendum)
hgba1c acceptable, minimize simple carbs. Increase exercise as tolerated. Will repeat labs to further investigate

## 2020-01-30 NOTE — Assessment & Plan Note (Signed)
Encouraged good sleep hygiene such as dark, quiet room. No blue/green glowing lights such as computer screens in bedroom. No alcohol or stimulants in evening. Cut down on caffeine as able. Regular exercise is helpful but not just prior to bed time. He reports the [employer] helps him sleep well and he only uses when home at bedtime. Denies any concerning side effects with medicine

## 2020-01-30 NOTE — Telephone Encounter (Signed)
Last refill 12/30/19 Pended 3 separate Rx's for Gregory Carr to review and send Next apt 04/12/2020

## 2020-02-02 ENCOUNTER — Other Ambulatory Visit: Payer: Self-pay

## 2020-02-02 ENCOUNTER — Encounter: Payer: Self-pay | Admitting: Physical Therapy

## 2020-02-02 ENCOUNTER — Ambulatory Visit
Payer: Federal, State, Local not specified - PPO | Attending: Physical Medicine and Rehabilitation | Admitting: Physical Therapy

## 2020-02-02 DIAGNOSIS — R252 Cramp and spasm: Secondary | ICD-10-CM

## 2020-02-02 DIAGNOSIS — M546 Pain in thoracic spine: Secondary | ICD-10-CM | POA: Insufficient documentation

## 2020-02-02 DIAGNOSIS — M6281 Muscle weakness (generalized): Secondary | ICD-10-CM

## 2020-02-02 NOTE — Patient Instructions (Signed)
Access Code: [facility] URL: [email or web address] Date: 02/02/2020 Prepared by: Amador Cunas  Exercises Sidelying Thoracic Rotation with Open Book - 1 x daily - 7 x weekly - 3 sets - 10 reps - 3 sec hold Quadruped Thoracic Rotation Full Range with Hand on Neck - 1 x daily - 7 x weekly - 3 sets - 10 reps - 3 sec hold Seated Thoracic Lumbar Extension with Pectoralis Stretch - 1 x daily - 7 x weekly - 3 sets - 10 reps - 3 sec hold Seated Scapular Retraction - 1 x daily - 7 x weekly - 3 sets - 10 reps - 3 sec hold

## 2020-02-02 NOTE — Therapy (Signed)
[place] [place], [name], [address] Phone: [phone]   Fax:  [phone]  Physical Therapy Evaluation  Patient Details  Name: Gregory Carr MRN: [id number] Date of Birth: [birth year] Referring Provider (PT): Bodea   Encounter Date: 02/02/2020   PT End of Session - 02/02/20 1344    Visit Number 1    Date for PT Re-Evaluation 04/03/20    PT Start Time 6063    PT Stop Time 1340    PT Time Calculation (min) 34 min    Activity Tolerance Patient tolerated treatment well    Behavior During Therapy [facility] for tasks assessed/performed           Past Medical History:  Diagnosis Date  . Abdominal pain, left lower quadrant 01/17/2015  . BACK PAIN, CHRONIC 11/11/2007   Qualifier: Diagnosis of  By: Redmond Pulling MD, Frann Rider    . BARRETTS ESOPHAGUS 11/11/2007   Qualifier: Diagnosis of  By: Redmond Pulling MD, Frann Rider    . BPH (benign prostatic hyperplasia) 09/13/2012  . BPH (benign prostatic hyperplasia) 09/13/2012   Follows with [facility]   . ERECTILE DYSFUNCTION 08/16/2006   Qualifier: Diagnosis of  By: Cletus Gash MD, [place]    . GERD 08/16/2006   Qualifier: Diagnosis of  By: Cletus Gash MD, [place]    . [place] SYNDROME 01/22/2008   Qualifier: Diagnosis of  By: Redmond Pulling MD, Frann Rider    . GLUCOSE INTOLERANCE 12/11/2007   Qualifier: Diagnosis of  By: Redmond Pulling MD, Frann Rider     . Grief reaction 11/07/2015  . HTN (hypertension) 03/24/2011  . HYPERLIPIDEMIA 08/16/2006   Qualifier: Diagnosis of  By: Cletus Gash MD, [place]    . Onychomycosis 09/13/2012  . Other anxiety states 04/08/2007   Qualifier: Diagnosis of  By: Cletus Gash MD, [place]    . Preventative health care 09/29/2013  . Renal cell carcinoma ([place]) 12/15/2011   S/p left nephrectomy Follows with [facility]   . Right hip pain 07/21/2016    History reviewed. No pertinent surgical history.  There were no vitals filed for this visit.    Subjective Assessment - 02/02/20 1307     Subjective Pt reports that he had two back surgeries ~15 years ago; after these surgeries he developed persistent mid back pain intermittent R LBP with radiating pain into LE. Pt recently had bilateral lumbar injections which he states has helped the lower lumbar pain and resolved the radiating pain. Pt is most concerned with pain in mid back.    Limitations Sitting;Standing    How long can you walk comfortably? indefinitely    Patient Stated Goals reduce pain be able to do what he wants without having to plan life around plan    Currently in Pain? Yes    Pain Score 0-No pain    Pain Location Back    Pain Orientation Mid    Pain Descriptors / Indicators Aching    Pain Type Chronic pain    Pain Onset More than a month ago    Pain Frequency Intermittent   only goes away when moving around or lying down   Aggravating Factors  prolonged sitting/standing    Pain Relieving Factors lying on back, ice, injections, medication              [facility] PT Assessment - 02/02/20 0001      Assessment   Medical Diagnosis LBP/thoracic pain    Referring Provider (PT) Bodea    Hand Dominance Left   ambidextrous  Next MD Visit 02/09/2020    Prior Therapy PT      Precautions   Precautions None      Restrictions   Weight Bearing Restrictions No      Balance Screen   Has the patient fallen in the past 6 months Yes    How many times? 1   fell in middle of night   Has the patient had a decrease in activity level because of a fear of falling?  No    Is the patient reluctant to leave their home because of a fear of falling?  No      Home Environment   Additional Comments stairs; reports no trouble with stairs      Prior Function   Level of Independence Independent    Vocation Retired    Education officer, community, work around house, some Pharmacist, hospital Intact      Functional Tests   Functional tests Sit to Stand      Sit to Stand   Comments [facility]      Posture/Postural  Control   Posture/Postural Control Postural limitations    Postural Limitations Rounded Shoulders;Forward head;Increased thoracic kyphosis      ROM / Strength   AROM / PROM / Strength AROM;Strength      AROM   Overall AROM Comments lumbar AROM WFL; some thoracic pain with end range lumbar extension and rotation      Strength   Overall Strength Comments BLE strength 5/5; core weakness and weakness of scap stabilizers      Flexibility   Soft Tissue Assessment /Muscle Length yes    Hamstrings WFL    Quadriceps --    ITB WFL    Piriformis tight      Palpation   Spinal mobility hypomobility of thoracic/lumbar spine; tender to palpation central processes    Palpation comment tender to palpation thoracic paraspinals      Transfers   Five time sit to stand comments  WFL                      Objective measurements completed on examination: See above findings.       [place] Adult PT Treatment/Exercise - 02/02/20 0001      Exercises   Exercises Lumbar      Lumbar Exercises: Stretches   Other Lumbar Stretch Exercise open book thoracic rotations x10 B 3 sec hold    Other Lumbar Stretch Exercise quadruped thoracic rotation x10      Lumbar Exercises: Seated   Other Seated Lumbar Exercises seated thoracic extension over chair with pec stretch x10    Other Seated Lumbar Exercises seated scap retraction with 3 sec hold                  PT Education - 02/02/20 1344    Education Details Pt educated on POC and HEP    Person(s) Educated Patient    Methods Explanation;Demonstration;Handout    Comprehension Verbalized understanding;Returned demonstration            PT Short Term Goals - 02/02/20 1349      PT SHORT TERM GOAL #1   Title Pt will be I with initial HEP    Time 2    Period Weeks    Status New    Target Date 02/16/20             PT Long Term Goals -  02/02/20 1350      PT LONG TERM GOAL #1   Title Pt will be I with advanced HEP    Time 4     Period Weeks    Status New    Target Date 03/01/20      PT LONG TERM GOAL #2   Title Pt will report 50% reduction in thoracic pain    Time 4    Period Weeks    Status New    Target Date 03/01/20      PT LONG TERM GOAL #3   Title Pt will report able to sit/stand for >30 min with </=1/10 thoracic pain    Time 4    Period Weeks    Status New    Target Date 03/01/20                  Plan - 02/02/20 1345    Clinical Impression Statement Pt presents to clinic with reports of chronic thoracic pain and intermittent LBP. Pt had injection recently which he states resolved most of his lower lumbar pain and radiating pain into RLE; primary complaint is thoracic pain. Pt demos weakness of core and scap stabilizers, stiffness through mid back, fwd head/rounded shoulders/thoracic kyphosis, and tenderness to palpation thoracic paraspinals. Pt reports most difficulty with prolonged sitting and standing; pain relieved with movement. Pt would benefit from skilled PT to address the above impairments.    Personal Factors and Comorbidities Time since onset of injury/illness/exacerbation    Examination-Activity Limitations Sit;Stand    Examination-Participation Restrictions Community Activity;Interpersonal Relationship    Stability/Clinical Decision Making Stable/Uncomplicated    Clinical Decision Making Low    Rehab Potential Good    PT Frequency 2x / week    PT Duration 4 weeks    PT Treatment/Interventions ADLs/Self Care Home Management;Electrical Stimulation;Iontophoresis 4mg /ml Dexamethasone;Moist Heat;Neuromuscular re-education;Therapeutic exercise;Therapeutic activities;Patient/family education;Manual techniques;Dry needling;Taping    PT Next Visit Plan review HEP, scap stab, core ex's, thoracic/lumbar flexibility, manual/modalities as indicated    PT Home Exercise Plan scap retraction, open book thoracic rotations, quadruped thoracic stretch, thoracic ext over chair    Consulted and  Agree with Plan of Care Patient           Patient will benefit from skilled therapeutic intervention in order to improve the following deficits and impairments:  Increased muscle spasms, Pain, Hypomobility, Impaired flexibility, Decreased strength, Postural dysfunction  Visit Diagnosis: Pain in thoracic spine  Muscle weakness (generalized)  Cramp and spasm     Problem List Patient Active Problem List   Diagnosis Date Noted  . Diarrhea 01/30/2020  . GAD (generalized anxiety disorder) 05/24/2018  . Leg cramps 05/13/2018  . Umbilical hernia without obstruction and without gangrene 05/13/2018  . Right hip pain 07/21/2016  . Grief reaction 11/07/2015  . Motion sickness 01/17/2015  . Abdominal pain, left lower quadrant 01/17/2015  . Hyperlipidemia 01/26/2014  . Preventative health care 09/29/2013  . Hallux limitus of left foot 11/08/2012  . Nail deformity 11/08/2012  . Onychomycosis 09/13/2012  . BPH (benign prostatic hyperplasia) 09/13/2012  . Renal cell carcinoma ([place]) 12/15/2011  . FATIGUE 10/04/2009  . MYALGIA 10/12/2008  . 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 GLUCOSE INTOLERANCE 12/11/2007  . OTHER DECREASED WHITE BLOOD CELL COUNT 12/11/2007  . Jaundice, non-neonatal 12/11/2007  . BARRETTS ESOPHAGUS 11/11/2007  . BACK PAIN, CHRONIC 11/11/2007  . Inguinal hernia 05/13/2007  . OTHER ANXIETY STATES 04/08/2007  . APHTHOUS ULCERS 04/08/2007  . Essential hypertension 08/16/2006  .  ERECTILE DYSFUNCTION 08/16/2006  . Attention deficit hyperactivity disorder (ADHD) 08/16/2006  . GERD 08/16/2006  . LOW BACK PAIN 08/16/2006  . INSOMNIA 08/16/2006   Amador Cunas, PT, DPT Donald Prose [place] Vidrine 02/02/2020, 1:51 PM  [place] [place], [name], [address] Phone: [phone]   Fax:  [phone]  Name: Gregory Carr MRN: [id number] Date of Birth: 1950/11/21

## 2020-02-03 ENCOUNTER — Ambulatory Visit: Payer: Self-pay

## 2020-02-03 ENCOUNTER — Other Ambulatory Visit: Payer: Federal, State, Local not specified - PPO

## 2020-02-03 NOTE — Telephone Encounter (Signed)
Paperwork completed and in front reception area for pick up under his last name. Pt called and informed of form being completed and ready for pick up

## 2020-02-03 NOTE — Telephone Encounter (Signed)
Patient called to check the status of paperwork for DVM  Patient would like a call back when complete, patient states the paper work needs to be completed and seen to [facility] by 02/06/2020

## 2020-02-04 ENCOUNTER — Telehealth: Payer: Self-pay

## 2020-02-04 ENCOUNTER — Other Ambulatory Visit: Payer: Federal, State, Local not specified - PPO

## 2020-02-04 NOTE — Telephone Encounter (Signed)
Pt rescheduled

## 2020-02-04 NOTE — Telephone Encounter (Signed)
Caller states he needs to reschedule lab appt. He is having car trouble. Telephone: [phone]

## 2020-02-04 NOTE — Telephone Encounter (Signed)
Called patient to r/s lab appointment, LVM to r/s

## 2020-02-06 LAB — HM COLONOSCOPY

## 2020-02-09 ENCOUNTER — Other Ambulatory Visit: Payer: Self-pay

## 2020-02-09 ENCOUNTER — Encounter: Payer: Self-pay | Admitting: Physical Therapy

## 2020-02-09 ENCOUNTER — Ambulatory Visit: Payer: Federal, State, Local not specified - PPO | Admitting: Physical Therapy

## 2020-02-09 ENCOUNTER — Other Ambulatory Visit: Payer: Federal, State, Local not specified - PPO

## 2020-02-09 DIAGNOSIS — M546 Pain in thoracic spine: Secondary | ICD-10-CM | POA: Diagnosis not present

## 2020-02-09 DIAGNOSIS — M6281 Muscle weakness (generalized): Secondary | ICD-10-CM

## 2020-02-09 DIAGNOSIS — R252 Cramp and spasm: Secondary | ICD-10-CM

## 2020-02-09 NOTE — Therapy (Signed)
[address]. [place], [name], [address] Phone: [phone]   Fax:  [phone]  Physical Therapy Treatment  Patient Details  Name: Gregory Carr MRN: [id number] Date of Birth: 09-29-1950 Referring Provider (PT): Gregory Carr   Encounter Date: 02/09/2020   PT End of Session - 02/09/20 1519    Visit Number 2    Date for PT Re-Evaluation 04/03/20    PT Start Time 9379    PT Stop Time 1528    PT Time Calculation (min) 50 min    Activity Tolerance Patient tolerated treatment well    Behavior During Therapy Gregory Carr for tasks assessed/performed           Past Medical History:  Diagnosis Date  . Abdominal pain, left lower quadrant 01/17/2015  . BACK PAIN, CHRONIC 11/11/2007   Qualifier: Diagnosis of  By: Gregory Pulling MD, Gregory Carr    . BARRETTS ESOPHAGUS 11/11/2007   Qualifier: Diagnosis of  By: Gregory Pulling MD, Gregory Carr    . BPH (benign prostatic hyperplasia) 09/13/2012  . BPH (benign prostatic hyperplasia) 09/13/2012   Follows with Gregory Carr   . ERECTILE DYSFUNCTION 08/16/2006   Qualifier: Diagnosis of  By: Gregory Gash MD, Gregory Carr    . GERD 08/16/2006   Qualifier: Diagnosis of  By: Gregory Gash MD, Gregory Carr    . [place] SYNDROME 01/22/2008   Qualifier: Diagnosis of  By: Gregory Pulling MD, Gregory Carr    . GLUCOSE INTOLERANCE 12/11/2007   Qualifier: Diagnosis of  By: Gregory Pulling MD, Gregory Carr     . Grief reaction 11/07/2015  . HTN (hypertension) 03/24/2011  . HYPERLIPIDEMIA 08/16/2006   Qualifier: Diagnosis of  By: Gregory Gash MD, [place]    . Onychomycosis 09/13/2012  . Other anxiety states 04/08/2007   Qualifier: Diagnosis of  By: Gregory Gash MD, [place]    . Preventative health care 09/29/2013  . Renal cell carcinoma (Gregory Carr) 12/15/2011   S/p left nephrectomy Follows with Gregory Carr   . Right hip pain 07/21/2016    History reviewed. No pertinent surgical history.  There were no vitals filed for this visit.   Subjective Assessment - 02/09/20 1440     Subjective Pt reports his back is still hurting; thinks exercises are helping some    Currently in Pain? Yes    Pain Score 5     Pain Location Back    Pain Orientation Mid                             Gregory Carr Adult PT Treatment/Exercise - 02/09/20 0001      Lumbar Exercises: Stretches   Gastroc Stretch Right;1 rep;Left;20 seconds      Lumbar Exercises: Aerobic   Recumbent Bike L4 x 6 min      Lumbar Exercises: Machines for Strengthening   Leg Press 50# 2x15 BLE    Other Lumbar Machine Exercise rows and lats 25# 2x15; AR press 15# CW/CCW x10 B    Other Lumbar Machine Exercise chest press 15# 2x15; standing shoulder ext 10# 2x15      Lumbar Exercises: Standing   Heel Raises 15 reps    Other Standing Lumbar Exercises yellow ball raise overhead 2x10    Other Standing Lumbar Exercises red scap stab 3 ways x5 B      Modalities   Modalities Moist Heat;Electrical Stimulation      Moist Heat Therapy   Number Minutes Moist Heat 12 Minutes    Moist Heat Location Lumbar Spine  lumbar and thoracic     Electrical Stimulation   Electrical Stimulation Location lumbar    Electrical Stimulation Action IFC    Electrical Stimulation Parameters supine    Electrical Stimulation Goals Pain                    PT Short Term Goals - 02/09/20 1521      PT SHORT TERM GOAL #1   Title Pt will be I with initial HEP    Status Achieved             PT Long Term Goals - 02/02/20 1350      PT LONG TERM GOAL #1   Title Pt will be I with advanced HEP    Time 4    Period Weeks    Status New    Target Date 03/01/20      PT LONG TERM GOAL #2   Title Pt will report 50% reduction in thoracic pain    Time 4    Period Weeks    Status New    Target Date 03/01/20      PT LONG TERM GOAL #3   Title Pt will report able to sit/stand for >30 min with </=1/10 thoracic pain    Time 4    Period Weeks    Status New    Target Date 03/01/20                 Plan -  02/09/20 1520    Clinical Impression Statement Pt tolerated progression to TE well. No complaints of increased thoracic/LBP with machine interventions. Some cuing for form with leg press and rows. Visible core weakness and shaking with standing shoulder ext and AR press. Pt did have some LBP with yellow ball raise exercise. Pt reports relief from heat and estim.    PT Treatment/Interventions ADLs/Self Care Home Management;Electrical Stimulation;Iontophoresis 4mg /ml Dexamethasone;Moist Heat;Neuromuscular re-education;Therapeutic exercise;Therapeutic activities;Patient/family education;Manual techniques;Dry needling;Taping    PT Next Visit Plan review HEP, scap stab, core ex's, thoracic/lumbar flexibility, manual/modalities as indicated    Consulted and Agree with Plan of Care Patient           Patient will benefit from skilled therapeutic intervention in order to improve the following deficits and impairments:  Increased muscle spasms, Pain, Hypomobility, Impaired flexibility, Decreased strength, Postural dysfunction  Visit Diagnosis: Pain in thoracic spine  Muscle weakness (generalized)  Cramp and spasm     Problem List Patient Active Problem List   Diagnosis Date Noted  . Diarrhea 01/30/2020  . GAD (generalized anxiety disorder) 05/24/2018  . Leg cramps 05/13/2018  . Umbilical hernia without obstruction and without gangrene 05/13/2018  . Right hip pain 07/21/2016  . Grief reaction 11/07/2015  . Motion sickness 01/17/2015  . Abdominal pain, left lower quadrant 01/17/2015  . Hyperlipidemia 01/26/2014  . Preventative health care 09/29/2013  . Hallux limitus of left foot 11/08/2012  . Nail deformity 11/08/2012  . Onychomycosis 09/13/2012  . BPH (benign prostatic hyperplasia) 09/13/2012  . Renal cell carcinoma ([place]) 12/15/2011  . FATIGUE 10/04/2009  . MYALGIA 10/12/2008  . 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 GLUCOSE INTOLERANCE 12/11/2007  . OTHER DECREASED WHITE  BLOOD CELL COUNT 12/11/2007  . Jaundice, non-neonatal 12/11/2007  . BARRETTS ESOPHAGUS 11/11/2007  . BACK PAIN, CHRONIC 11/11/2007  . Inguinal hernia 05/13/2007  . OTHER ANXIETY STATES 04/08/2007  . APHTHOUS ULCERS 04/08/2007  . Essential hypertension 08/16/2006  . ERECTILE DYSFUNCTION 08/16/2006  . Attention deficit hyperactivity disorder (  ADHD) 08/16/2006  . GERD 08/16/2006  . LOW BACK PAIN 08/16/2006  . INSOMNIA 08/16/2006   Amador Cunas, PT, DPT Donald Prose [place] Feick 02/09/2020, 3:22 PM  [place]. [place], [name], [address] Phone: [phone]   Fax:  [phone]  Name: Gregory Carr MRN: [id number] Date of Birth: 01-02-1951

## 2020-02-10 ENCOUNTER — Other Ambulatory Visit: Payer: Federal, State, Local not specified - PPO

## 2020-02-10 ENCOUNTER — Ambulatory Visit (INDEPENDENT_AMBULATORY_CARE_PROVIDER_SITE_OTHER): Payer: Federal, State, Local not specified - PPO | Admitting: *Deleted

## 2020-02-10 DIAGNOSIS — Z23 Encounter for immunization: Secondary | ICD-10-CM | POA: Diagnosis not present

## 2020-02-10 NOTE — Progress Notes (Signed)
Patient here for high dose flu vaccine.  Vaccine given in right deltoid and patient tolerated well.   

## 2020-02-12 ENCOUNTER — Ambulatory Visit: Payer: Federal, State, Local not specified - PPO | Admitting: Physical Therapy

## 2020-02-13 ENCOUNTER — Other Ambulatory Visit: Payer: Self-pay

## 2020-02-13 ENCOUNTER — Telehealth: Payer: Self-pay | Admitting: Physician Assistant

## 2020-02-13 MED ORDER — ZALEPLON 10 MG PO CAPS: ORAL_CAPSULE | ORAL | 2 refills | Status: DC

## 2020-02-13 NOTE — Telephone Encounter (Signed)
Pt called and needs a refill of his sonata sent to the [facility] on [address]

## 2020-02-13 NOTE — Telephone Encounter (Signed)
Pt called and said that he needs a new script of sonata sent to the [facility] on [address]

## 2020-02-13 NOTE — Telephone Encounter (Signed)
Last refill 01/20/2020 Pended will fill date of 02/17/2020 for Helene Kelp to review and send

## 2020-02-16 ENCOUNTER — Encounter: Payer: Self-pay | Admitting: Physical Therapy

## 2020-02-16 ENCOUNTER — Ambulatory Visit: Payer: Federal, State, Local not specified - PPO | Admitting: Physical Therapy

## 2020-02-16 ENCOUNTER — Other Ambulatory Visit: Payer: Self-pay

## 2020-02-16 DIAGNOSIS — R252 Cramp and spasm: Secondary | ICD-10-CM

## 2020-02-16 DIAGNOSIS — M6281 Muscle weakness (generalized): Secondary | ICD-10-CM

## 2020-02-16 DIAGNOSIS — M546 Pain in thoracic spine: Secondary | ICD-10-CM

## 2020-02-16 NOTE — Therapy (Signed)
[place] [place], [name], [address] Phone: [phone]   Fax:  [phone]  Physical Therapy Treatment  Patient Details  Name: Gregory Carr MRN: [id number] Date of Birth: Feb 18, 1951 Referring Provider (PT): Greta Doom   Encounter Date: 02/16/2020   PT End of Session - 02/16/20 1523    Visit Number 3    Date for PT Re-Evaluation 04/03/20    PT Start Time 1440    PT Stop Time 1523    PT Time Calculation (min) 43 min    Activity Tolerance Patient tolerated treatment well    Behavior During Therapy [facility] for tasks assessed/performed           Past Medical History:  Diagnosis Date  . Abdominal pain, left lower quadrant 01/17/2015  . BACK PAIN, CHRONIC 11/11/2007   Qualifier: Diagnosis of  By: Redmond Pulling MD, Frann Rider    . BARRETTS ESOPHAGUS 11/11/2007   Qualifier: Diagnosis of  By: Redmond Pulling MD, Frann Rider    . BPH (benign prostatic hyperplasia) 09/13/2012  . BPH (benign prostatic hyperplasia) 09/13/2012   Follows with [facility]   . ERECTILE DYSFUNCTION 08/16/2006   Qualifier: Diagnosis of  By: Cletus Gash MD, [place]    . GERD 08/16/2006   Qualifier: Diagnosis of  By: Cletus Gash MD, [place]    . [place] SYNDROME 01/22/2008   Qualifier: Diagnosis of  By: Redmond Pulling MD, Frann Rider    . GLUCOSE INTOLERANCE 12/11/2007   Qualifier: Diagnosis of  By: Redmond Pulling MD, Frann Rider     . Grief reaction 11/07/2015  . HTN (hypertension) 03/24/2011  . HYPERLIPIDEMIA 08/16/2006   Qualifier: Diagnosis of  By: Cletus Gash MD, [place]    . Onychomycosis 09/13/2012  . Other anxiety states 04/08/2007   Qualifier: Diagnosis of  By: Cletus Gash MD, [place]    . Preventative health care 09/29/2013  . Renal cell carcinoma ([place]) 12/15/2011   S/p left nephrectomy Follows with [facility]   . Right hip pain 07/21/2016    History reviewed. No pertinent surgical history.  There were no vitals filed for this visit.   Subjective Assessment - 02/16/20 1446     Subjective Pt reports lower back feels a little bruised, unsure why    Currently in Pain? Yes    Pain Score 6     Pain Location Back                             [facility] Adult PT Treatment/Exercise - 02/16/20 0001      Lumbar Exercises: Aerobic   Recumbent Bike L 3-4 x 6 min      Lumbar Exercises: Machines for Strengthening   Cybex Lumbar Extension black TB 2x15    Cybex Knee Extension 10# 2x10    Cybex Knee Flexion 20# 2x10    Leg Press 50# 2x15 BLE; calf raises 50# 2x15 BLE    Other Lumbar Machine Exercise rows and lats 25# 2x15; AR press 15#  2x10 B    Other Lumbar Machine Exercise chest press 15# 2x15; standing shoulder ext 10# 2x15      Lumbar Exercises: Standing   Heel Raises 15 reps    Other Standing Lumbar Exercises yellow ball raise overhead 2x10    Other Standing Lumbar Exercises red scap stab 3 ways x5 B                    PT Short Term Goals - 02/16/20 1524  PT SHORT TERM GOAL #1   Title Pt will be I with initial HEP    Status Achieved             PT Long Term Goals - 02/16/20 1524      PT LONG TERM GOAL #1   Title Pt will be I with advanced HEP    Time 4    Period Weeks    Status On-going      PT LONG TERM GOAL #2   Title Pt will report 50% reduction in thoracic pain    Time 4    Period Weeks    Status Partially Met      PT LONG TERM GOAL #3   Title Pt will report able to sit/stand for >30 min with </=1/10 thoracic pain    Time 4    Period Weeks    Status Partially Met                 Plan - 02/16/20 1523    Clinical Impression Statement Pt tolerated progression of TE well; no complaints of increased back pain with any interventions. Visible core weakness and shaking with scap stab ex's and core ex's; required cues to avoid compensations with these ex's. Continue to progress core and LE strengthening.    PT Treatment/Interventions ADLs/Self Care Home Management;Electrical Stimulation;Iontophoresis 4mg /ml  Dexamethasone;Moist Heat;Neuromuscular re-education;Therapeutic exercise;Therapeutic activities;Patient/family education;Manual techniques;Dry needling;Taping    PT Next Visit Plan scap stab, core ex's, thoracic/lumbar flexibility, manual/modalities as indicated    Consulted and Agree with Plan of Care Patient           Patient will benefit from skilled therapeutic intervention in order to improve the following deficits and impairments:  Increased muscle spasms, Pain, Hypomobility, Impaired flexibility, Decreased strength, Postural dysfunction  Visit Diagnosis: Pain in thoracic spine  Muscle weakness (generalized)  Cramp and spasm     Problem List Patient Active Problem List   Diagnosis Date Noted  . Diarrhea 01/30/2020  . GAD (generalized anxiety disorder) 05/24/2018  . Leg cramps 05/13/2018  . Umbilical hernia without obstruction and without gangrene 05/13/2018  . Right hip pain 07/21/2016  . Grief reaction 11/07/2015  . Motion sickness 01/17/2015  . Abdominal pain, left lower quadrant 01/17/2015  . Hyperlipidemia 01/26/2014  . Preventative health care 09/29/2013  . Hallux limitus of left foot 11/08/2012  . Nail deformity 11/08/2012  . Onychomycosis 09/13/2012  . BPH (benign prostatic hyperplasia) 09/13/2012  . Renal cell carcinoma ([place]) 12/15/2011  . FATIGUE 10/04/2009  . MYALGIA 10/12/2008  . 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 GLUCOSE INTOLERANCE 12/11/2007  . OTHER DECREASED WHITE BLOOD CELL COUNT 12/11/2007  . Jaundice, non-neonatal 12/11/2007  . BARRETTS ESOPHAGUS 11/11/2007  . BACK PAIN, CHRONIC 11/11/2007  . Inguinal hernia 05/13/2007  . OTHER ANXIETY STATES 04/08/2007  . APHTHOUS ULCERS 04/08/2007  . Essential hypertension 08/16/2006  . ERECTILE DYSFUNCTION 08/16/2006  . Attention deficit hyperactivity disorder (ADHD) 08/16/2006  . GERD 08/16/2006  . LOW BACK PAIN 08/16/2006  . INSOMNIA 08/16/2006   Amador Cunas, PT, DPT Donald Prose  Munirah Doerner 02/16/2020, 3:25 PM  [place]. [place], [name], [address] Phone: [phone]   Fax:  [phone]  Name: ANTONINO NIENHUIS MRN: [id number] Date of Birth: Jun 21, 1950

## 2020-02-17 ENCOUNTER — Other Ambulatory Visit (INDEPENDENT_AMBULATORY_CARE_PROVIDER_SITE_OTHER): Payer: Federal, State, Local not specified - PPO

## 2020-02-17 DIAGNOSIS — E782 Mixed hyperlipidemia: Secondary | ICD-10-CM

## 2020-02-17 DIAGNOSIS — E739 Lactose intolerance, unspecified: Secondary | ICD-10-CM

## 2020-02-17 DIAGNOSIS — R1032 Left lower quadrant pain: Secondary | ICD-10-CM

## 2020-02-17 DIAGNOSIS — I10 Essential (primary) hypertension: Secondary | ICD-10-CM

## 2020-02-18 ENCOUNTER — Other Ambulatory Visit: Payer: Self-pay | Admitting: Physician Assistant

## 2020-02-18 LAB — COMPREHENSIVE METABOLIC PANEL
AG Ratio: 1.7 (calc) (ref 1.0–2.5)
ALT: 18 U/L (ref 9–46)
AST: 17 U/L (ref 10–35)
Albumin: 4 g/dL (ref 3.6–5.1)
Alkaline phosphatase (APISO): 78 U/L (ref 35–144)
BUN/Creatinine Ratio: 26 (calc) — ABNORMAL HIGH (ref 6–22)
BUN: 27 mg/dL — ABNORMAL HIGH (ref 7–25)
CO2: 30 mmol/L (ref 20–32)
Calcium: 9.5 mg/dL (ref 8.6–10.3)
Chloride: 102 mmol/L (ref 98–110)
Creat: 1.04 mg/dL (ref 0.70–1.25)
Globulin: 2.4 g/dL (calc) (ref 1.9–3.7)
Glucose, Bld: 82 mg/dL (ref 65–99)
Potassium: 4.3 mmol/L (ref 3.5–5.3)
Sodium: 142 mmol/L (ref 135–146)
Total Bilirubin: 0.6 mg/dL (ref 0.2–1.2)
Total Protein: 6.4 g/dL (ref 6.1–8.1)

## 2020-02-18 LAB — TSH: TSH: 2.37 mIU/L (ref 0.40–4.50)

## 2020-02-18 LAB — CBC WITH DIFFERENTIAL/PLATELET
Absolute Monocytes: 673 cells/uL (ref 200–950)
Basophils Absolute: 17 cells/uL (ref 0–200)
Basophils Relative: 0.3 %
Eosinophils Absolute: 99 cells/uL (ref 15–500)
Eosinophils Relative: 1.7 %
HCT: 43.9 % (ref 38.5–50.0)
Hemoglobin: 14.4 g/dL (ref 13.2–17.1)
Lymphs Abs: 1578 cells/uL (ref 850–3900)
MCH: 30.8 pg (ref 27.0–33.0)
MCHC: 32.8 g/dL (ref 32.0–36.0)
MCV: 93.8 fL (ref 80.0–100.0)
MPV: 10.4 fL (ref 7.5–12.5)
Monocytes Relative: 11.6 %
Neutro Abs: 3434 cells/uL (ref 1500–7800)
Neutrophils Relative %: 59.2 %
Platelets: 206 10*3/uL (ref 140–400)
RBC: 4.68 10*6/uL (ref 4.20–5.80)
RDW: 12.7 % (ref 11.0–15.0)
Total Lymphocyte: 27.2 %
WBC: 5.8 10*3/uL (ref 3.8–10.8)

## 2020-02-18 LAB — HEMOGLOBIN A1C
Hgb A1c MFr Bld: 5.5 % of total Hgb (ref <5.7)
Mean Plasma Glucose: 111 (calc)
eAG (mmol/L): 6.2 (calc)

## 2020-02-18 LAB — LIPID PANEL
Cholesterol: 148 mg/dL (ref <200)
HDL: 56 mg/dL (ref >=40)
LDL Cholesterol (Calc): 68 mg/dL (calc)
Non-HDL Cholesterol (Calc): 92 mg/dL (calc) (ref <130)
Total CHOL/HDL Ratio: 2.6 (calc) (ref <5.0)
Triglycerides: 161 mg/dL — ABNORMAL HIGH (ref <150)

## 2020-02-18 NOTE — Progress Notes (Signed)
Pt is aware of labs results

## 2020-02-19 ENCOUNTER — Encounter: Payer: Self-pay | Admitting: Physical Therapy

## 2020-02-19 ENCOUNTER — Other Ambulatory Visit: Payer: Self-pay

## 2020-02-19 ENCOUNTER — Ambulatory Visit: Payer: Federal, State, Local not specified - PPO | Admitting: Physical Therapy

## 2020-02-19 DIAGNOSIS — M546 Pain in thoracic spine: Secondary | ICD-10-CM | POA: Diagnosis not present

## 2020-02-19 DIAGNOSIS — R252 Cramp and spasm: Secondary | ICD-10-CM

## 2020-02-19 DIAGNOSIS — M6281 Muscle weakness (generalized): Secondary | ICD-10-CM

## 2020-02-19 NOTE — Therapy (Signed)
[place] [place], [name], [address] Phone: [phone]   Fax:  [phone]  Physical Therapy Treatment  Patient Details  Name: Gregory Carr MRN: [id number] Date of Birth: 1950-11-24 Referring Provider (PT): Greta Doom   Encounter Date: 02/19/2020   PT End of Session - 02/19/20 1527    Visit Number 4    Date for PT Re-Evaluation 04/03/20    PT Start Time 8280    PT Stop Time 1527    PT Time Calculation (min) 44 min    Activity Tolerance Patient tolerated treatment well    Behavior During Therapy [facility] for tasks assessed/performed           Past Medical History:  Diagnosis Date  . Abdominal pain, left lower quadrant 01/17/2015  . BACK PAIN, CHRONIC 11/11/2007   Qualifier: Diagnosis of  By: Redmond Pulling MD, Frann Rider    . BARRETTS ESOPHAGUS 11/11/2007   Qualifier: Diagnosis of  By: Redmond Pulling MD, Frann Rider    . BPH (benign prostatic hyperplasia) 09/13/2012  . BPH (benign prostatic hyperplasia) 09/13/2012   Follows with [facility]   . ERECTILE DYSFUNCTION 08/16/2006   Qualifier: Diagnosis of  By: Cletus Gash MD, [place]    . GERD 08/16/2006   Qualifier: Diagnosis of  By: Cletus Gash MD, [place]    . [place] SYNDROME 01/22/2008   Qualifier: Diagnosis of  By: Redmond Pulling MD, Frann Rider    . GLUCOSE INTOLERANCE 12/11/2007   Qualifier: Diagnosis of  By: Redmond Pulling MD, Frann Rider     . Grief reaction 11/07/2015  . HTN (hypertension) 03/24/2011  . HYPERLIPIDEMIA 08/16/2006   Qualifier: Diagnosis of  By: Cletus Gash MD, [place]    . Onychomycosis 09/13/2012  . Other anxiety states 04/08/2007   Qualifier: Diagnosis of  By: Cletus Gash MD, [place]    . Preventative health care 09/29/2013  . Renal cell carcinoma ([place]) 12/15/2011   S/p left nephrectomy Follows with [facility]   . Right hip pain 07/21/2016    History reviewed. No pertinent surgical history.  There were no vitals filed for this visit.   Subjective Assessment - 02/19/20 1445     Subjective Pt reports back feels much better overall    Currently in Pain? Yes    Pain Score 5     Pain Location Back    Pain Orientation Mid                             [facility] Adult PT Treatment/Exercise - 02/19/20 0001      Lumbar Exercises: Aerobic   UBE (Upper Arm Bike) L3 3 fwd/3bkwd    Recumbent Bike L5 x 5 min      Lumbar Exercises: Machines for Strengthening   Cybex Knee Extension 15# 2x10    Cybex Knee Flexion 35# 2x10    Leg Press 50# 2x15 BLE; calf raises 50# 2x15 BLE    Other Lumbar Machine Exercise rows and lats 25# 2x15; AR press 15#  2x10 B    Other Lumbar Machine Exercise chest press 15# 2x15; standing shoulder ext 10# 2x15      Lumbar Exercises: Standing   Other Standing Lumbar Exercises yellow ball raise overhead 2x10    Other Standing Lumbar Exercises green scap stab 3 ways x 5 B; green ball shoulder ER x10 B                    PT Short Term Goals - 02/16/20  87      PT SHORT TERM GOAL #1   Title Pt will be I with initial HEP    Status Achieved             PT Long Term Goals - 02/16/20 1524      PT LONG TERM GOAL #1   Title Pt will be I with advanced HEP    Time 4    Period Weeks    Status On-going      PT LONG TERM GOAL #2   Title Pt will report 50% reduction in thoracic pain    Time 4    Period Weeks    Status Partially Met      PT LONG TERM GOAL #3   Title Pt will report able to sit/stand for >30 min with </=1/10 thoracic pain    Time 4    Period Weeks    Status Partially Met                 Plan - 02/19/20 1528    Clinical Impression Statement Pt tolerated progression of TE well; no complaints of increased pain with interventions. Pt still demos core weakness with standing AR press and standing shoulder extensions. Cuing for form with these ex's. Pt reports he is progressing with less thoracic pain during daily activities.    PT Treatment/Interventions ADLs/Self Care Home Management;Electrical  Stimulation;Iontophoresis 4mg /ml Dexamethasone;Moist Heat;Neuromuscular re-education;Therapeutic exercise;Therapeutic activities;Patient/family education;Manual techniques;Dry needling;Taping    PT Next Visit Plan scap stab, core ex's, thoracic/lumbar flexibility, manual/modalities as indicated    Consulted and Agree with Plan of Care Patient           Patient will benefit from skilled therapeutic intervention in order to improve the following deficits and impairments:  Increased muscle spasms, Pain, Hypomobility, Impaired flexibility, Decreased strength, Postural dysfunction  Visit Diagnosis: Pain in thoracic spine  Muscle weakness (generalized)  Cramp and spasm     Problem List Patient Active Problem List   Diagnosis Date Noted  . Diarrhea 01/30/2020  . GAD (generalized anxiety disorder) 05/24/2018  . Leg cramps 05/13/2018  . Umbilical hernia without obstruction and without gangrene 05/13/2018  . Right hip pain 07/21/2016  . Grief reaction 11/07/2015  . Motion sickness 01/17/2015  . Abdominal pain, left lower quadrant 01/17/2015  . Hyperlipidemia 01/26/2014  . Preventative health care 09/29/2013  . Hallux limitus of left foot 11/08/2012  . Nail deformity 11/08/2012  . Onychomycosis 09/13/2012  . BPH (benign prostatic hyperplasia) 09/13/2012  . Renal cell carcinoma ([place]) 12/15/2011  . FATIGUE 10/04/2009  . MYALGIA 10/12/2008  . 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 GLUCOSE INTOLERANCE 12/11/2007  . OTHER DECREASED WHITE BLOOD CELL COUNT 12/11/2007  . Jaundice, non-neonatal 12/11/2007  . BARRETTS ESOPHAGUS 11/11/2007  . BACK PAIN, CHRONIC 11/11/2007  . Inguinal hernia 05/13/2007  . OTHER ANXIETY STATES 04/08/2007  . APHTHOUS ULCERS 04/08/2007  . Essential hypertension 08/16/2006  . ERECTILE DYSFUNCTION 08/16/2006  . Attention deficit hyperactivity disorder (ADHD) 08/16/2006  . GERD 08/16/2006  . LOW BACK PAIN 08/16/2006  . INSOMNIA 08/16/2006   Amador Cunas, PT, DPT Donald Prose Ashtynn Berke 02/19/2020, 3:29 PM  [place]. [place], [name], [address] Phone: [phone]   Fax:  [phone]  Name: Gregory Carr MRN: [id number] Date of Birth: [birth year]

## 2020-02-23 ENCOUNTER — Ambulatory Visit
Payer: Federal, State, Local not specified - PPO | Attending: Physical Medicine and Rehabilitation | Admitting: Physical Therapy

## 2020-02-23 ENCOUNTER — Other Ambulatory Visit: Payer: Self-pay

## 2020-02-23 ENCOUNTER — Encounter: Payer: Self-pay | Admitting: Physical Therapy

## 2020-02-23 DIAGNOSIS — M546 Pain in thoracic spine: Secondary | ICD-10-CM | POA: Insufficient documentation

## 2020-02-23 DIAGNOSIS — M6281 Muscle weakness (generalized): Secondary | ICD-10-CM | POA: Diagnosis present

## 2020-02-23 DIAGNOSIS — R252 Cramp and spasm: Secondary | ICD-10-CM | POA: Insufficient documentation

## 2020-02-23 NOTE — Therapy (Signed)
[address]. [place], [name], [address] Phone: [phone]   Fax:  [phone]  Physical Therapy Treatment  Patient Details  Name: Gregory Carr MRN: [id number] Date of Birth: 08-25-1950 Referring Provider (PT): [place]   Encounter Date: 02/23/2020   PT End of Session - 02/23/20 1509    Visit Number 5    Date for PT Re-Evaluation 04/03/20    PT Start Time 1430    PT Stop Time 1515    PT Time Calculation (min) 45 min    Activity Tolerance Patient tolerated treatment well    Behavior During Therapy [facility] for tasks assessed/performed           Past Medical History:  Diagnosis Date  . Abdominal pain, left lower quadrant 01/17/2015  . BACK PAIN, CHRONIC 11/11/2007   Qualifier: Diagnosis of  By: Redmond Pulling MD, Frann Rider    . BARRETTS ESOPHAGUS 11/11/2007   Qualifier: Diagnosis of  By: Redmond Pulling MD, Frann Rider    . BPH (benign prostatic hyperplasia) 09/13/2012  . BPH (benign prostatic hyperplasia) 09/13/2012   Follows with [facility]   . ERECTILE DYSFUNCTION 08/16/2006   Qualifier: Diagnosis of  By: Cletus Gash MD, [place]    . GERD 08/16/2006   Qualifier: Diagnosis of  By: Cletus Gash MD, [place]    . [place] SYNDROME 01/22/2008   Qualifier: Diagnosis of  By: Redmond Pulling MD, Frann Rider    . GLUCOSE INTOLERANCE 12/11/2007   Qualifier: Diagnosis of  By: Redmond Pulling MD, Frann Rider     . Grief reaction 11/07/2015  . HTN (hypertension) 03/24/2011  . HYPERLIPIDEMIA 08/16/2006   Qualifier: Diagnosis of  By: Cletus Gash MD, [place]    . Onychomycosis 09/13/2012  . Other anxiety states 04/08/2007   Qualifier: Diagnosis of  By: Cletus Gash MD, [place]    . Preventative health care 09/29/2013  . Renal cell carcinoma ([place]) 12/15/2011   S/p left nephrectomy Follows with [facility]   . Right hip pain 07/21/2016    History reviewed. No pertinent surgical history.  There were no vitals filed for this visit.   Subjective Assessment - 02/23/20 1430     Subjective Back is a little better sine starting PT    Currently in Pain? Yes    Pain Score 5     Pain Location Back                             [facility] Adult PT Treatment/Exercise - 02/23/20 0001      Lumbar Exercises: Aerobic   UBE (Upper Arm Bike) L2 2 fwd/2bkwd    Nustep L5 x 6 min       Lumbar Exercises: Machines for Strengthening   Cybex Knee Extension 15# 2x10    Cybex Knee Flexion 35# 2x10    Other Lumbar Machine Exercise rows and lats 35# 2x10    Other Lumbar Machine Exercise chest press 20# 2x10      Lumbar Exercises: Standing   Other Standing Lumbar Exercises AR press 25lb x10 each, [place] arm pull downs 25lb 2x10     Other Standing Lumbar Exercises Hip Ext 10lb 2x10 each       Lumbar Exercises: Seated   Sit to Stand 10 reps   x2, OHP with 10lb                    PT Short Term Goals - 02/16/20 1524      PT SHORT TERM  GOAL #1   Title Pt will be I with initial HEP    Status Achieved             PT Long Term Goals - 02/23/20 1511      PT LONG TERM GOAL #1   Title Pt will be I with advanced HEP      PT LONG TERM GOAL #2   Title Pt will report 50% reduction in thoracic pain    Status Partially Met      PT LONG TERM GOAL #3   Title Pt will report able to sit/stand for >30 min with </=1/10 thoracic pain    Status Partially Met                 Plan - 02/23/20 1512    Clinical Impression Statement Pt did well with a progressed treatment session. Cues needed for pacing with sit to stands. Core weakness remains with anti rotational presses. Tactile cue of posture needed with hip extension, he did report that the ext felt good on his lower back. Increase resistance tolerated with seated rows and lats.    Personal Factors and Comorbidities Time since onset of injury/illness/exacerbation    Examination-Activity Limitations Sit;Stand    Examination-Participation Restrictions Community Activity;Interpersonal Relationship     Stability/Clinical Decision Making Stable/Uncomplicated    Rehab Potential Good    PT Frequency 2x / week    PT Treatment/Interventions ADLs/Self Care Home Management;Electrical Stimulation;Iontophoresis 4mg /ml Dexamethasone;Moist Heat;Neuromuscular re-education;Therapeutic exercise;Therapeutic activities;Patient/family education;Manual techniques;Dry needling;Taping    PT Next Visit Plan scap stab, core ex's, thoracic/lumbar flexibility, manual/modalities as indicated           Patient will benefit from skilled therapeutic intervention in order to improve the following deficits and impairments:  Increased muscle spasms, Pain, Hypomobility, Impaired flexibility, Decreased strength, Postural dysfunction  Visit Diagnosis: Cramp and spasm  Muscle weakness (generalized)  Pain in thoracic spine     Problem List Patient Active Problem List   Diagnosis Date Noted  . Diarrhea 01/30/2020  . GAD (generalized anxiety disorder) 05/24/2018  . Leg cramps 05/13/2018  . Umbilical hernia without obstruction and without gangrene 05/13/2018  . Right hip pain 07/21/2016  . Grief reaction 11/07/2015  . Motion sickness 01/17/2015  . Abdominal pain, left lower quadrant 01/17/2015  . Hyperlipidemia 01/26/2014  . Preventative health care 09/29/2013  . Hallux limitus of left foot 11/08/2012  . Nail deformity 11/08/2012  . Onychomycosis 09/13/2012  . BPH (benign prostatic hyperplasia) 09/13/2012  . Renal cell carcinoma ([place]) 12/15/2011  . FATIGUE 10/04/2009  . MYALGIA 10/12/2008  . 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 GLUCOSE INTOLERANCE 12/11/2007  . OTHER DECREASED WHITE BLOOD CELL COUNT 12/11/2007  . Jaundice, non-neonatal 12/11/2007  . BARRETTS ESOPHAGUS 11/11/2007  . BACK PAIN, CHRONIC 11/11/2007  . Inguinal hernia 05/13/2007  . OTHER ANXIETY STATES 04/08/2007  . APHTHOUS ULCERS 04/08/2007  . Essential hypertension 08/16/2006  . ERECTILE DYSFUNCTION 08/16/2006  . Attention  deficit hyperactivity disorder (ADHD) 08/16/2006  . GERD 08/16/2006  . LOW BACK PAIN 08/16/2006  . INSOMNIA 08/16/2006    Scot Jun, PTA 02/23/2020, 3:34 PM  [place]. [place], [name], [address] Phone: [phone]   Fax:  [phone]  Name: Gregory Carr MRN: [id number] Date of Birth: July 19, 1950

## 2020-02-26 ENCOUNTER — Ambulatory Visit: Payer: Federal, State, Local not specified - PPO | Admitting: Physical Therapy

## 2020-03-03 ENCOUNTER — Other Ambulatory Visit: Payer: Self-pay | Admitting: Psychiatry

## 2020-03-03 ENCOUNTER — Telehealth: Payer: Self-pay | Admitting: Physician Assistant

## 2020-03-03 MED ORDER — AMPHETAMINE-DEXTROAMPHETAMINE 20 MG PO TABS
20.0000 mg | ORAL_TABLET | Freq: Three times a day (TID) | ORAL | 0 refills | Status: DC
Start: 2020-03-31 — End: 2020-04-12

## 2020-03-03 MED ORDER — AMPHETAMINE-DEXTROAMPHETAMINE 20 MG PO TABS
20.0000 mg | ORAL_TABLET | Freq: Three times a day (TID) | ORAL | 0 refills | Status: DC
Start: 2020-03-03 — End: 2020-04-12

## 2020-03-03 NOTE — Telephone Encounter (Signed)
Patient's next appt is 12/20. Needs refill on amphetamine salts called to [facility] on [address], [place]. [place].

## 2020-03-03 NOTE — Telephone Encounter (Signed)
sent 

## 2020-04-07 ENCOUNTER — Other Ambulatory Visit: Payer: Self-pay | Admitting: Family Medicine

## 2020-04-12 ENCOUNTER — Encounter: Payer: Self-pay | Admitting: Physician Assistant

## 2020-04-12 ENCOUNTER — Other Ambulatory Visit: Payer: Self-pay

## 2020-04-12 ENCOUNTER — Ambulatory Visit (INDEPENDENT_AMBULATORY_CARE_PROVIDER_SITE_OTHER): Payer: Federal, State, Local not specified - PPO | Admitting: Physician Assistant

## 2020-04-12 DIAGNOSIS — F902 Attention-deficit hyperactivity disorder, combined type: Secondary | ICD-10-CM

## 2020-04-12 DIAGNOSIS — F32A Depression, unspecified: Secondary | ICD-10-CM

## 2020-04-12 DIAGNOSIS — G4700 Insomnia, unspecified: Secondary | ICD-10-CM

## 2020-04-12 MED ORDER — AMPHETAMINE-DEXTROAMPHETAMINE 20 MG PO TABS: 20.0000 mg | ORAL_TABLET | Freq: Three times a day (TID) | ORAL | 0 refills | Status: DC

## 2020-04-12 MED ORDER — ZOLPIDEM TARTRATE ER 12.5 MG PO TBCR: 12.5000 mg | EXTENDED_RELEASE_TABLET | Freq: Every evening | ORAL | 0 refills | Status: DC | PRN

## 2020-04-12 NOTE — Patient Instructions (Addendum)
Stop the Sonata.

## 2020-04-12 NOTE — Progress Notes (Signed)
[facility] Med Check  Patient ID: Gregory Carr,  MRN: [id number]  PCP: Mosie Lukes, MD  Date of Evaluation: 04/12/2020 Time spent:30 minutes  Chief Complaint:  Chief Complaint    Insomnia; Depression; ADD      HISTORY/CURRENT STATUS: HPI routine med check.  Doesn't sleep well. Sonata isn't working as well as it did.  He has a harder time falling asleep than he did when he first went on the [place].  He will also wake up during the night more often than he did.  Taking the mid nocturnal dose is effective.  Denies drinking caffeine late in the evening.  No problems with mood.  He is able to enjoy things.  Energy and motivation are good.  He is trying to find a part-time job because he would like something to do.  He does chores around the house and watches TV.  Those are his main activities since he retired 8 years ago.  Denies suicidal or homicidal thoughts.  States he is able to focus well and complete tasks.  Energy and motivation are good also with the Adderall.  Patient denies increased energy with decreased need for sleep, no increased talkativeness, no racing thoughts, no impulsivity or risky behaviors, no increased spending, no increased libido, no grandiosity, no increased irritability or anger, and no hallucinations.  Denies dizziness, syncope, seizures, numbness, tingling, tremor, tics, unsteady gait, slurred speech, confusion. Denies muscle or joint pain, stiffness, or dystonia.  Individual Medical History/ Review of Systems: Changes? :No    Past medications for mental health diagnoses include: Ambien, Belsomra, Remeron,Restoril, Serzone, Cymbalta, Trazodone, Effexor XR, Sonata.   Allergies: Patient has no known allergies.  Current Medications:  Current Outpatient Medications:  .  atorvastatin (LIPITOR) 80 MG tablet, Take 1 tablet (80 mg total) by mouth daily., Disp: 90 tablet, Rfl: 0 .  Coenzyme Q10 (COQ-10) 400 MG CAPS, Take 400 mg by mouth daily.  Reported on 10/28/2015, Disp: , Rfl:  .  esomeprazole (NEXIUM) 40 MG capsule, TAKE 1 CAPSULE BY MOUTH ONCE DAILY BEFORE BREAKFAST, Disp: 90 capsule, Rfl: 0 .  lisinopril-hydrochlorothiazide (ZESTORETIC) 20-12.5 MG tablet, Take 1 tablet by mouth daily., Disp: 90 tablet, Rfl: 3 .  traZODone (DESYREL) 100 MG tablet, TAKE 1 TO 2 TABLETS BY MOUTH AT BEDTIME AS NEEDED FOR SLEEP, Disp: 180 tablet, Rfl: 1 .  zaleplon (SONATA) 10 MG capsule, Take 1 capsule by mouth at bedtime for sleep, may repeat 1 capsule prn for mid nocturnal awakening with 3 hours left to sleep, Disp: 60 capsule, Rfl: 2 .  [START ON 07/01/2020] amphetamine-dextroamphetamine (ADDERALL) 20 MG tablet, Take 1 tablet (20 mg total) by mouth 3 (three) times daily., Disp: 90 tablet, Rfl: 0 .  [START ON 06/04/2020] amphetamine-dextroamphetamine (ADDERALL) 20 MG tablet, Take 1 tablet (20 mg total) by mouth 3 (three) times daily., Disp: 90 tablet, Rfl: 0 .  [START ON 05/05/2020] amphetamine-dextroamphetamine (ADDERALL) 20 MG tablet, Take 1 tablet (20 mg total) by mouth 3 (three) times daily., Disp: 90 tablet, Rfl: 0 .  Fiber CAPS, Take by mouth daily. Reported on 10/28/2015 (Patient not taking: Reported on 04/12/2020), Disp: , Rfl:  .  morphine (MS CONTIN) 15 MG 12 hr tablet, Take 15 mg by mouth every 12 (twelve) hours., Disp: , Rfl:  .  NUCYNTA ER 100 MG 12 hr tablet, Take 2 tablets by mouth daily.  (Patient not taking: Reported on 04/12/2020), Disp: , Rfl:  .  zolpidem (AMBIEN CR) 12.5 MG CR tablet, Take 1  tablet (12.5 mg total) by mouth at bedtime as needed for sleep., Disp: 30 tablet, Rfl: 0 Medication Side Effects: none  Family Medical/ Social History: Changes? No  MENTAL HEALTH EXAM:  There were no vitals taken for this visit.There is no height or weight on file to calculate BMI.  General Appearance: Casual, Neat and Well Groomed  Eye Contact:  Good  Speech:  Clear and Coherent and Normal Rate  Volume:  Normal  Mood:  Euthymic  Affect:   Appropriate  Thought Process:  Goal Directed and Descriptions of Associations: Intact  Orientation:  Full (Time, Place, and Person)  Thought Content: Logical   Suicidal Thoughts:  No  Homicidal Thoughts:  No  Memory:  WNL  Judgement:  Good  Insight:  Good  Psychomotor Activity:  Normal  Concentration:  Concentration: Good and Attention Span: Good  Recall:  Good  Fund of Knowledge: Good  Language: Good  Assets:  Desire for Improvement  ADL's:  Intact  Cognition: WNL  Prognosis:  Good    DIAGNOSES:    ICD-10-CM   1. Insomnia, unspecified type  G47.00   2. Attention deficit hyperactivity disorder (ADHD), combined type  F90.2   3. Depression, unspecified depression type  F32.A     Receiving Psychotherapy: No    RECOMMENDATIONS:  PDMP was reviewed. I provided 30 minutes of face-to-face care during this encounter including time spent counseling for sleep hygiene. We discussed going back to Ambien or one of the other sleep medications that he has tried.  He would like to do that to see if it works better than the [employer].  He does have 1 refill left on the Sonata but I have advised him not to have it filled and explained he should stop it.  He will take the Ambien instead.  He will call in 3 to 4 weeks to let me know how the Ambien is working.  If it is working well, I will send in refills.  If not, other options may be [place], Jarold Motto, or others. Start Ambien CR 12.5 mg, 1 p.o. nightly as needed sleep. Continue Adderall 20 mg, 1 p.o. 3 times daily. Continue trazodone 100 mg, 1-2 nightly as needed sleep. Return in 6 months.  Donnal Moat, PA-C

## 2020-04-13 ENCOUNTER — Other Ambulatory Visit: Payer: Self-pay | Admitting: Family Medicine

## 2020-04-19 ENCOUNTER — Ambulatory Visit: Payer: Federal, State, Local not specified - PPO | Admitting: Medical

## 2020-04-19 ENCOUNTER — Telehealth: Payer: Federal, State, Local not specified - PPO | Admitting: Medical

## 2020-04-19 ENCOUNTER — Telehealth: Payer: Self-pay | Admitting: Family Medicine

## 2020-04-19 ENCOUNTER — Other Ambulatory Visit: Payer: Self-pay

## 2020-04-19 VITALS — BP 148/88 | HR 72 | Resp 18 | Ht 67.0 in | Wt 166.0 lb

## 2020-04-19 DIAGNOSIS — G8929 Other chronic pain: Secondary | ICD-10-CM

## 2020-04-19 DIAGNOSIS — M546 Pain in thoracic spine: Secondary | ICD-10-CM | POA: Diagnosis not present

## 2020-04-19 NOTE — Progress Notes (Signed)
Subjective:    Patient ID: Gregory Carr, male    DOB: November 13, 1950, 69 y.o.   MRN: [id number]  HPI  Virtual Visit via Telephone Note  I connected with CARZELL SALDIVAR on 04/19/20 at  4:40 PM EST by telephone and verified that I am speaking with the correct person using two identifiers.  Location: Patient: home Provider: office   I discussed the limitations, risks, security and privacy concerns of performing an evaluation and management service by telephone and the availability of in person appointments. I also discussed with the patient that there may be a patient responsible charge related to this service. The patient expressed understanding and agreed to proceed.   History of Present Illness:   Pt states he is trying to stop using morphine. Pt has been on med for 10-15 years. Pt decided to taper off and is now just taking 1 tab a day.   Pt was on this for neck pain and thoracic. Pain in neck went away but thoracic pain is daily. 2011 t spine mri shows some findings.  Pain in t spine is 8-9.  Pt wants to consider methadone or other alternative pain medication.  He states was formally in pain management years ago.  Pt has 7-8 tabs of morphine. Pt taking just one tablet in am.  This is decreased from twice daily.  Mri of tspine shows below.  T1-T2: Mild circumferential disc bulge. No spinal stenosis. Mild  bilateral foraminal involvement is stable.    T2-T3: Mild left eccentric circumferential disc bulge is mildly  increased, but no spinal stenosis results. Mild bilateral  foraminal involvement without significant stenosis.    T3-T4: Mild circumferential disc bulges increased. No stenosis.    T4-T5: Mild circumferential disc bulges stable. No stenosis.    T5-T6: Anterior disc osteophyte complex re-identified. Otherwise  negative.    T6-T7: Mild circumferential disc bulge with superimposed anterior  disc osteophyte complex has increased. No stenosis.     T7-T8: Stable mild circumferential disc bulge. No stenosis.    T8-T9: Stable mild left eccentric circumferential disc bulge. No  stenosis.    T9-T10: Incidental anterior disc osteophyte complex otherwise  negative.    T10-T11: Incidental anterior disc osteophyte complex, otherwise  negative.    T11-T12: Negative.   Observations/Objective: General-no acute distress, alert, oriented and pleasant.  Normal speech.  Assessment and Plan: You do have history of chronic thoracic back pain.  MRI does show some findings of disc bulges.  You expressed desire to get off of your morphine sulfate extended release.  You report using 1 tablet daily over the last 7 days.  You desire to get off of morphine completely.  Tolerating late afternoon and night without any pain medication.  I did talk with our pharmacist and they do not make a immediate release version.  Our pharmacist recommended potentially prescribing morphine sulfate immediate release 15 mg tablet and advising that you take one half of the immediate release tab daily.   Patient's PCP is Dr. Abner Greenspan sent her message asking her to review and authorized changing to immediate release and using one half tab daily.    Follow-up date to be determined after review with Dr. Abner Greenspan.  Esperanza Richters, PA-C   Time spent with patient today was 25  minutes which consisted of chart revdiew, discussing diagnosis, work up, treatment and documentation.  Follow Up Instructions:    I discussed the assessment and treatment plan with the patient. The patient was provided an  opportunity to ask questions and all were answered. The patient agreed with the plan and demonstrated an understanding of the instructions.   The patient was advised to call back or seek an in-person evaluation if the symptoms worsen or if the condition fails to improve as anticipated.     Esperanza Richters, PA-C    Review of Systems     Objective:   Physical  Exam        Assessment & Plan:

## 2020-04-19 NOTE — Telephone Encounter (Signed)
Gregory Carr  from physical therapy order 2 time a week with recertification  2 time a week

## 2020-04-19 NOTE — Patient Instructions (Addendum)
You do have history of chronic thoracic back pain.  MRI does show some findings of disc bulges.  You expressed desire to get off of your morphine sulfate extended release.  You report using 1 tablet daily over the last 7 days.  You desire to get off of morphine completely.  Tolerating late afternoon and night without any pain medication.  I did talk with our pharmacist and they do not make a immediate release version.  Our pharmacist recommended potentially prescribing morphine sulfate immediate release 15 mg tablet and advising that you take one half of the immediate release tab daily.   Patient's PCP is Dr. Charlett Blake sent her message asking her to review and authorized changing to immediate release and using one half tab daily.    Follow-up date to be determined after review with Dr. Charlett Blake.

## 2020-04-20 NOTE — Telephone Encounter (Signed)
LMOM for Chris w/ verbal orders.  

## 2020-04-20 NOTE — Progress Notes (Signed)
Opened to close. I saw pt yesterday.

## 2020-04-22 ENCOUNTER — Telehealth: Payer: Self-pay | Admitting: Medical

## 2020-04-22 ENCOUNTER — Encounter: Payer: Self-pay | Admitting: Medical

## 2020-04-22 NOTE — Telephone Encounter (Signed)
Opened to review 

## 2020-04-27 ENCOUNTER — Other Ambulatory Visit: Payer: Self-pay | Admitting: Family Medicine

## 2020-04-27 ENCOUNTER — Telehealth: Payer: Self-pay | Admitting: Medical

## 2020-04-27 MED ORDER — MORPHINE SULFATE ER 15 MG PO TBCR: 15.0000 mg | EXTENDED_RELEASE_TABLET | Freq: Two times a day (BID) | ORAL | 0 refills | Status: DC

## 2020-04-27 NOTE — Telephone Encounter (Signed)
Looks like the refill was sent in. Have hime give it another week on this lower dose so his body has time to acclimate then if he still wants to change the dose he needs a  VV to discuss options and strategies.

## 2020-04-27 NOTE — Telephone Encounter (Signed)
I talked with pt. His former back pain is severe/returned. He had wanted to be off narcotic but pain increased severely as he attempted to taper down. He wants to go back on former dosing. He was down to one tablet of morphine.  He has appointment with me on 11th. Dr. Abner Greenspan in office that day will consult with her. Have discussed with her already his pain med regimen and his expressed desire to get off med. Now pt wants to resume former dosing.

## 2020-04-27 NOTE — Telephone Encounter (Signed)
Patient states that his level is about a 9, he is unable to sleep and hard to sit up.  He is on 1/2 (7.5mg ) tab of morphine ER.    He does not feel that he can go without this medication and possibly need to go back up to his usual dose.

## 2020-04-27 NOTE — Telephone Encounter (Signed)
Patient states that he is running out of the medication today and needs a refill   Please advise

## 2020-04-27 NOTE — Telephone Encounter (Signed)
Would you call patient and touch base with him about his level of pain and his refill of morphine.  He has been trying to taper off of medication and recently the pain is increased.  I have plans to give him morphine immediate release and have him take 1/2 tablet a day as he was attempting to taper off narcotics completely.  By his description on my chart he appears to be in extreme pain.  He has been on 1 tablet of extended release daily over the last week or so.  I am reluctant to send in the immediate release medication.  I was thinking of him staying on morphine extended release 1 tablet daily.  Or if needed he can go back to taking 1 tablet twice daily.  Patient made effort to taper off medications by himself.  We did not write him to do so but he expressed desire to taper off.   Since he wanted to taper off did attempt to advise however appears not going smoothly.  Let me know what he says.

## 2020-04-28 NOTE — Telephone Encounter (Signed)
Thank you :)

## 2020-04-28 NOTE — Telephone Encounter (Signed)
I talked to pt yesterday afternoon. After discussion he wanted to get back on former regimen.   I am going to see him next week.

## 2020-04-28 NOTE — Telephone Encounter (Signed)
I accidentally sent to Dr. Abner Greenspan can you review the message from yesterday when I spoke with him.   Sorry.

## 2020-05-03 ENCOUNTER — Other Ambulatory Visit: Payer: Self-pay

## 2020-05-04 ENCOUNTER — Other Ambulatory Visit: Payer: Self-pay

## 2020-05-04 ENCOUNTER — Ambulatory Visit: Payer: Federal, State, Local not specified - PPO | Admitting: Medical

## 2020-05-04 VITALS — BP 148/88 | HR 78 | Temp 98.4°F | Resp 18 | Ht 67.0 in | Wt 170.0 lb

## 2020-05-04 DIAGNOSIS — G8929 Other chronic pain: Secondary | ICD-10-CM

## 2020-05-04 DIAGNOSIS — I10 Essential (primary) hypertension: Secondary | ICD-10-CM

## 2020-05-04 DIAGNOSIS — M546 Pain in thoracic spine: Secondary | ICD-10-CM | POA: Diagnosis not present

## 2020-05-04 DIAGNOSIS — Z79899 Other long term (current) drug therapy: Secondary | ICD-10-CM | POA: Diagnosis not present

## 2020-05-04 LAB — COMPREHENSIVE METABOLIC PANEL
ALT: 23 U/L (ref 0–53)
AST: 18 U/L (ref 0–37)
Albumin: 4.4 g/dL (ref 3.5–5.2)
Alkaline Phosphatase: 78 U/L (ref 39–117)
BUN: 26 mg/dL — ABNORMAL HIGH (ref 6–23)
CO2: 33 mEq/L — ABNORMAL HIGH (ref 19–32)
Calcium: 9.5 mg/dL (ref 8.4–10.5)
Chloride: 103 mEq/L (ref 96–112)
Creatinine, Ser: 0.96 mg/dL (ref 0.40–1.50)
GFR: 81.49 mL/min (ref >60.00)
Glucose, Bld: 108 mg/dL — ABNORMAL HIGH (ref 70–99)
Potassium: 4.5 mEq/L (ref 3.5–5.1)
Sodium: 139 mEq/L (ref 135–145)
Total Bilirubin: 0.8 mg/dL (ref 0.2–1.2)
Total Protein: 6.6 g/dL (ref 6.0–8.3)

## 2020-05-04 MED ORDER — MORPHINE SULFATE ER 15 MG PO TBCR: 15.0000 mg | EXTENDED_RELEASE_TABLET | Freq: Two times a day (BID) | ORAL | 0 refills | Status: DC

## 2020-05-04 NOTE — Patient Instructions (Addendum)
For your chronic back pain thoracic area will refer you to neurosurgeon. Might need to order mri before appointment  If prior mri not adequate. Refilling your morphine today. Pharmacy can fill when you are due.  Controlled med contract and uds done today.  Your blood pressure is possibly related to level of chronic pain.  Continue BP medication.  We will check your metabolic panel today.  I would ask  that she check blood pressure at home.  Would like blood pressure to be less than 140/90.  If not then would need to make BP med adjustment.  Follow up as regularly scheduled with pcp or as needed.

## 2020-05-04 NOTE — Progress Notes (Signed)
Subjective:    Patient ID: ALGOT Carr, male    DOB: 1950-06-30, 70 y.o.   MRN: [id number]  HPI  Pt in for chronic back pain in t spine area. Pt has pain for 20 years. Pt wanted to be off meds and wanted to be off meds. See prior office visit notes.   Tapering off of meds did not go well and he wanted to get back on former dose of morphine.  Pt states pain is now tolerable. He states level is 7/10 but able to sleep.  His last mri showed.   MRI THORACIC SPINE WITHOUT CONTRAST    Technique: Multiplanar and multiecho pulse sequences of the  thoracic spine were obtained without intravenous contrast.    Comparison: Cervical MRI from the same day. Thoracic MRI  11/16/2007.    Findings: Stable central cystic area partially re-identified within  the left kidney. Other visualized upper abdominal viscera are  stable within normal limits. Visualized thoracic viscera are  within normal limits.    Stable thoracic vertebral body height and alignment. No marrow  edema or evidence of acute osseous abnormality.    Spinal cord signal is within normal limits at all visualized  levels. Conus medullaris at T12-L1.    T1-T2: Mild circumferential disc bulge. No spinal stenosis. Mild  bilateral foraminal involvement is stable.    T2-T3: Mild left eccentric circumferential disc bulge is mildly  increased, but no spinal stenosis results. Mild bilateral  foraminal involvement without significant stenosis.    T3-T4: Mild circumferential disc bulges increased. No stenosis.    T4-T5: Mild circumferential disc bulges stable. No stenosis.    T5-T6: Anterior disc osteophyte complex re-identified. Otherwise  negative.    T6-T7: Mild circumferential disc bulge with superimposed anterior  disc osteophyte complex has increased. No stenosis.    T7-T8: Stable mild circumferential disc bulge. No stenosis.    T8-T9: Stable mild left eccentric circumferential disc bulge.  No  stenosis.    T9-T10: Incidental anterior disc osteophyte complex otherwise  negative.    T10-T11: Incidental anterior disc osteophyte complex, otherwise  negative.    T11-T12: Negative.  Pt is on morphine ms contin15 mg 12 hours.    Review of Systems  Constitutional: Negative for chills, fatigue and fever.  Respiratory: Negative for cough, chest tightness, shortness of breath and wheezing.   Cardiovascular: Negative for chest pain and palpitations.  Gastrointestinal: Negative for abdominal pain.  Musculoskeletal: Positive for back pain. Negative for neck pain.  Skin: Negative for rash.  Neurological: Negative for dizziness and light-headedness.  Hematological: Negative for adenopathy. Does not bruise/bleed easily.  Psychiatric/Behavioral: Negative for behavioral problems, confusion, hallucinations and suicidal ideas. The patient is not nervous/anxious.     Past Medical History:  Diagnosis Date  . Abdominal pain, left lower quadrant 01/17/2015  . BACK PAIN, CHRONIC 11/11/2007   Qualifier: Diagnosis of  By: Redmond Pulling MD, Frann Rider    . BARRETTS ESOPHAGUS 11/11/2007   Qualifier: Diagnosis of  By: Redmond Pulling MD, Frann Rider    . BPH (benign prostatic hyperplasia) 09/13/2012  . BPH (benign prostatic hyperplasia) 09/13/2012   Follows with [facility]   . ERECTILE DYSFUNCTION 08/16/2006   Qualifier: Diagnosis of  By: Cletus Gash MD, [place]    . GERD 08/16/2006   Qualifier: Diagnosis of  By: Cletus Gash MD, [place]    . [place] SYNDROME 01/22/2008   Qualifier: Diagnosis of  By: Redmond Pulling MD, Frann Rider    . GLUCOSE INTOLERANCE 12/11/2007   Qualifier: Diagnosis of  By:  Redmond Pulling MD, Frann Rider     . Grief reaction 11/07/2015  . HTN (hypertension) 03/24/2011  . HYPERLIPIDEMIA 08/16/2006   Qualifier: Diagnosis of  By: Cletus Gash MD, [place]    . Onychomycosis 09/13/2012  . Other anxiety states 04/08/2007   Qualifier: Diagnosis of  By: Cletus Gash MD, [place]    . Preventative health care 09/29/2013  . 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 Right hip pain 07/21/2016     Social History   Socioeconomic History  . Marital status: Widowed    Spouse name: Not on file  . Number of children: Not on file  . Years of education: Not on file  . Highest education level: Not on file  Occupational History  . Not on file  Tobacco Use  . Smoking status: Former Smoker    Types: Cigarettes    Quit date: 08/23/1975    Years since quitting: 44.7  . Smokeless tobacco: Never Used  Substance and Sexual Activity  . Alcohol use: Yes    Alcohol/week: 1.0 - 2.0 standard drink    Types: 1 - 2 Cans of beer per week  . Drug use: No  . Sexual activity: Yes    Comment: lives with wife, no dietary restrictions, regular exercise  Other Topics Concern  . Not on file  Social History Narrative   Jewish   Social Determinants of Health   Financial Resource Strain: Not on file  Food Insecurity: Not on file  Transportation Needs: Not on file  Physical Activity: Not on file  Stress: Not on file  Social Connections: Not on file  Intimate Partner Violence: Not on file    No past surgical history on file.  Family History  Problem Relation Age of Onset  . Dementia Mother   . Cancer Daughter        brain cancer, glioblastoma  . Hypertension Maternal Grandfather   . Heart disease Maternal Grandfather        MI  . Stroke Paternal Grandfather   . Arthritis Sister     No Known Allergies  Current Outpatient Medications on File Prior to Visit  Medication Sig Dispense Refill  . [START ON 07/01/2020] amphetamine-dextroamphetamine (ADDERALL) 20 MG tablet Take 1 tablet (20 mg total) by mouth 3 (three) times daily. 90 tablet 0  . [START ON 06/04/2020] amphetamine-dextroamphetamine (ADDERALL) 20 MG tablet Take 1 tablet (20 mg total) by mouth 3 (three) times daily. 90 tablet 0  . [START ON 05/05/2020] amphetamine-dextroamphetamine (ADDERALL) 20 MG tablet Take 1 tablet (20  mg total) by mouth 3 (three) times daily. 90 tablet 0  . atorvastatin (LIPITOR) 80 MG tablet TAKE 1 TABLET BY MOUTH EVERY DAY 90 tablet 0  . Coenzyme Q10 (COQ-10) 400 MG CAPS Take 400 mg by mouth daily. Reported on 10/28/2015    . esomeprazole (NEXIUM) 40 MG capsule TAKE 1 CAPSULE BY MOUTH ONCE DAILY BEFORE BREAKFAST 90 capsule 0  . Fiber CAPS Take by mouth daily. Reported on 10/28/2015    . lisinopril-hydrochlorothiazide (ZESTORETIC) 20-12.5 MG tablet Take 1 tablet by mouth daily. 90 tablet 3  . NUCYNTA ER 100 MG 12 hr tablet Take 2 tablets by mouth daily.    . traZODone (DESYREL) 100 MG tablet TAKE 1 TO 2 TABLETS BY MOUTH AT BEDTIME AS NEEDED FOR SLEEP 180 tablet 1  . zaleplon (SONATA) 10 MG capsule Take 1 capsule by mouth at bedtime for sleep, may repeat 1 capsule prn  for mid nocturnal awakening with 3 hours left to sleep 60 capsule 2  . zolpidem (AMBIEN CR) 12.5 MG CR tablet Take 1 tablet (12.5 mg total) by mouth at bedtime as needed for sleep. 30 tablet 0   No current facility-administered medications on file prior to visit.    BP (!) 148/88 (BP Location: Right Arm, Patient Position: Sitting, Cuff Size: Normal)   Pulse 78   Temp 98.4 F (36.9 C) (Oral)   Resp 18   Ht 5\' 7"  (1.702 m)   Wt 170 lb (77.1 kg)   SpO2 99%   BMI 26.63 kg/m       Objective:   Physical Exam  General Appearance- Not in acute distress.    Chest and Lung Exam Auscultation: Breath sounds:-Normal. Clear even and unlabored. Adventitious sounds:- No Adventitious sounds.  Cardiovascular Auscultation:Rythm - Regular, rate and rythm. Heart Sounds -Normal heart sounds.  Abdomen Inspection:-Inspection Normal.  Palpation/Perucssion: Palpation and Percussion of the abdomen reveal- Non Tender, No Rebound tenderness, No rigidity(Guarding) and No Palpable abdominal masses.  Liver:-Normal.  Spleen:- Normal.   Back No Mid lumbar spine tenderness to palpation. Pain on lateral movements and flexion/extension of  the spine.  Lower ext neurologic  L5-S1 sensation intact bilaterally. Normal patellar reflexes bilaterally. No foot drop bilaterally.  Thoracid spine- mid area level of scapula has constant pain.      Assessment & Plan:  For your chronic back pain thoracic area will refer you to neurosurgeon. Might need to order mri before appointment  If prior mri not adequate. Refilling your morphine today. Pharmacy can fill when you are due.  Controlled med contract and uds done today.  Your blood pressure is possibly related to level of chronic pain.  Continue BP medication.  We will check your metabolic panel today.  I would ask  that she check blood pressure at home.  Would like blood pressure to be less than 140/90.  If not then would need to make BP med adjustment  Follow up as regularly scheduled with pcp or as needed.

## 2020-05-07 LAB — DRUG MONITORING, PANEL 8 WITH CONFIRMATION, URINE
6 Acetylmorphine: NEGATIVE ng/mL (ref <10)
Alcohol Metabolites: NEGATIVE ng/mL
Amphetamine: 7157 ng/mL — ABNORMAL HIGH (ref <250)
Amphetamines: POSITIVE ng/mL — AB (ref <500)
Benzodiazepines: NEGATIVE ng/mL (ref <100)
Buprenorphine, Urine: NEGATIVE ng/mL (ref <5)
Cocaine Metabolite: NEGATIVE ng/mL (ref <150)
Codeine: NEGATIVE ng/mL (ref <50)
Creatinine: 65.9 mg/dL
Hydrocodone: NEGATIVE ng/mL (ref <50)
Hydromorphone: 71 ng/mL — ABNORMAL HIGH (ref <50)
MDMA: NEGATIVE ng/mL (ref <500)
Marijuana Metabolite: NEGATIVE ng/mL (ref <20)
Methamphetamine: NEGATIVE ng/mL (ref <250)
Morphine: 9516 ng/mL — ABNORMAL HIGH (ref <50)
Norhydrocodone: NEGATIVE ng/mL (ref <50)
Opiates: POSITIVE ng/mL — AB (ref <100)
Oxidant: NEGATIVE ug/mL
Oxycodone: NEGATIVE ng/mL (ref <100)
pH: 5.8 (ref 4.5–9.0)

## 2020-05-07 LAB — DM TEMPLATE

## 2020-05-14 ENCOUNTER — Telehealth: Payer: Self-pay | Admitting: Physician Assistant

## 2020-05-14 ENCOUNTER — Other Ambulatory Visit: Payer: Self-pay | Admitting: Physician Assistant

## 2020-05-14 MED ORDER — ZALEPLON 10 MG PO CAPS: ORAL_CAPSULE | ORAL | 2 refills | Status: DC

## 2020-05-14 NOTE — Telephone Encounter (Signed)
Pt called and would like a refill on his sonata to be sent to the [facility] on [address]. He said the [place] didn't work

## 2020-05-14 NOTE — Telephone Encounter (Signed)
Prescription for Sonata was sent.

## 2020-05-20 ENCOUNTER — Encounter: Payer: Self-pay | Admitting: Medical

## 2020-06-06 ENCOUNTER — Other Ambulatory Visit: Payer: Self-pay | Admitting: Medical

## 2020-06-07 MED ORDER — MORPHINE SULFATE ER 15 MG PO TBCR: 15.0000 mg | EXTENDED_RELEASE_TABLET | Freq: Two times a day (BID) | ORAL | 0 refills | Status: DC

## 2020-06-07 NOTE — Telephone Encounter (Signed)
Requesting: morphine Contract:  UDS: 05/04/20 Last Visit: 05/04/20 Next Visit: 05/04/20 Last Refill: 05/04/20  Please Advise

## 2020-06-25 ENCOUNTER — Telehealth: Payer: Self-pay

## 2020-06-25 NOTE — Telephone Encounter (Signed)
Prior Authorization submitted and approved for AMPHETAMINE-DEXTROAMPHETAMINE 20 MG TABS #90/30 DAY effective 05/26/2020-06/25/2021 with [employer]

## 2020-07-05 ENCOUNTER — Other Ambulatory Visit: Payer: Self-pay | Admitting: Family Medicine

## 2020-07-07 ENCOUNTER — Other Ambulatory Visit: Payer: Self-pay | Admitting: Family Medicine

## 2020-07-07 MED ORDER — MORPHINE SULFATE ER 15 MG PO TBCR: 15.0000 mg | EXTENDED_RELEASE_TABLET | Freq: Two times a day (BID) | ORAL | 0 refills | Status: DC

## 2020-07-07 MED ORDER — ESOMEPRAZOLE MAGNESIUM 40 MG PO CPDR: 40.0000 mg | DELAYED_RELEASE_CAPSULE | Freq: Every day | ORAL | 0 refills | Status: DC

## 2020-07-07 NOTE — Telephone Encounter (Signed)
Requesting: morphine 15mg  Contract: 05/04/2020 UDS: 05/04/2020 Last Visit: 05/04/2020 Next Visit: 08/05/2020 Last Refill: 06/07/2020,  #60, 0 RF  Please Advise

## 2020-07-08 NOTE — Telephone Encounter (Signed)
Rx was sent 07-07-2020

## 2020-07-08 NOTE — Telephone Encounter (Signed)
Can you deny this- you already sent it in.

## 2020-07-27 ENCOUNTER — Other Ambulatory Visit: Payer: Self-pay | Admitting: Physician Assistant

## 2020-07-27 ENCOUNTER — Telehealth: Payer: Self-pay | Admitting: Physician Assistant

## 2020-07-27 MED ORDER — AMPHETAMINE-DEXTROAMPHETAMINE 20 MG PO TABS: 20.0000 mg | ORAL_TABLET | Freq: Three times a day (TID) | ORAL | 0 refills | Status: DC

## 2020-07-27 NOTE — Telephone Encounter (Signed)
Please review

## 2020-07-27 NOTE — Telephone Encounter (Signed)
Next visit is 10/11/20. Requesting refill on Amphetamines Dextro called to:  [place], [place]. Phone:  [phone]  Fax:  [phone]

## 2020-07-27 NOTE — Telephone Encounter (Signed)
Prescription sent

## 2020-08-05 ENCOUNTER — Other Ambulatory Visit: Payer: Self-pay

## 2020-08-05 ENCOUNTER — Ambulatory Visit (HOSPITAL_BASED_OUTPATIENT_CLINIC_OR_DEPARTMENT_OTHER)
Admission: RE | Admit: 2020-08-05 | Discharge: 2020-08-05 | Disposition: A | Discharge status: Home / Self Care | Payer: Federal, State, Local not specified - PPO | Source: Ambulatory Visit | Attending: Family Medicine | Admitting: Family Medicine

## 2020-08-05 ENCOUNTER — Ambulatory Visit (INDEPENDENT_AMBULATORY_CARE_PROVIDER_SITE_OTHER): Payer: Federal, State, Local not specified - PPO | Admitting: Family Medicine

## 2020-08-05 ENCOUNTER — Ambulatory Visit: Payer: Federal, State, Local not specified - PPO | Attending: Internal Medicine

## 2020-08-05 ENCOUNTER — Encounter: Payer: Self-pay | Admitting: Family Medicine

## 2020-08-05 VITALS — BP 122/70 | HR 76 | Temp 98.4°F | Resp 16 | Ht 67.0 in | Wt 165.4 lb

## 2020-08-05 DIAGNOSIS — E782 Mixed hyperlipidemia: Secondary | ICD-10-CM | POA: Diagnosis not present

## 2020-08-05 DIAGNOSIS — N400 Enlarged prostate without lower urinary tract symptoms: Secondary | ICD-10-CM

## 2020-08-05 DIAGNOSIS — I10 Essential (primary) hypertension: Secondary | ICD-10-CM | POA: Diagnosis not present

## 2020-08-05 DIAGNOSIS — E739 Lactose intolerance, unspecified: Secondary | ICD-10-CM | POA: Diagnosis not present

## 2020-08-05 DIAGNOSIS — Z23 Encounter for immunization: Secondary | ICD-10-CM

## 2020-08-05 DIAGNOSIS — Z0000 Encounter for general adult medical examination without abnormal findings: Secondary | ICD-10-CM | POA: Diagnosis not present

## 2020-08-05 DIAGNOSIS — M546 Pain in thoracic spine: Secondary | ICD-10-CM | POA: Diagnosis not present

## 2020-08-05 LAB — CBC WITH DIFFERENTIAL/PLATELET
Basophils Absolute: 0 10*3/uL (ref 0.0–0.1)
Basophils Relative: 0.4 % (ref 0.0–3.0)
Eosinophils Absolute: 0.1 10*3/uL (ref 0.0–0.7)
Eosinophils Relative: 0.9 % (ref 0.0–5.0)
HCT: 41 % (ref 39.0–52.0)
Hemoglobin: 14.2 g/dL (ref 13.0–17.0)
Lymphocytes Relative: 15.5 % (ref 12.0–46.0)
Lymphs Abs: 1.3 10*3/uL (ref 0.7–4.0)
MCHC: 34.6 g/dL (ref 30.0–36.0)
MCV: 90.6 fl (ref 78.0–100.0)
Monocytes Absolute: 1 10*3/uL (ref 0.1–1.0)
Monocytes Relative: 12.4 % — ABNORMAL HIGH (ref 3.0–12.0)
Neutro Abs: 5.8 10*3/uL (ref 1.4–7.7)
Neutrophils Relative %: 70.8 % (ref 43.0–77.0)
Platelets: 214 10*3/uL (ref 150.0–400.0)
RBC: 4.53 Mil/uL (ref 4.22–5.81)
RDW: 13.4 % (ref 11.5–15.5)
WBC: 8.1 10*3/uL (ref 4.0–10.5)

## 2020-08-05 LAB — LIPID PANEL
Cholesterol: 125 mg/dL (ref 0–200)
HDL: 54.3 mg/dL (ref >39.00)
LDL Cholesterol: 52 mg/dL (ref 0–99)
NonHDL: 71.18
Total CHOL/HDL Ratio: 2
Triglycerides: 94 mg/dL (ref 0.0–149.0)
VLDL: 18.8 mg/dL (ref 0.0–40.0)

## 2020-08-05 LAB — COMPREHENSIVE METABOLIC PANEL
ALT: 23 U/L (ref 0–53)
AST: 21 U/L (ref 0–37)
Albumin: 4.1 g/dL (ref 3.5–5.2)
Alkaline Phosphatase: 77 U/L (ref 39–117)
BUN: 30 mg/dL — ABNORMAL HIGH (ref 6–23)
CO2: 32 mEq/L (ref 19–32)
Calcium: 9.5 mg/dL (ref 8.4–10.5)
Chloride: 103 mEq/L (ref 96–112)
Creatinine, Ser: 1.05 mg/dL (ref 0.40–1.50)
GFR: 73.05 mL/min (ref >60.00)
Glucose, Bld: 89 mg/dL (ref 70–99)
Potassium: 4.5 mEq/L (ref 3.5–5.1)
Sodium: 141 mEq/L (ref 135–145)
Total Bilirubin: 1.3 mg/dL — ABNORMAL HIGH (ref 0.2–1.2)
Total Protein: 6.6 g/dL (ref 6.0–8.3)

## 2020-08-05 LAB — TSH: TSH: 2.63 u[IU]/mL (ref 0.35–4.50)

## 2020-08-05 MED ORDER — TAMSULOSIN HCL 0.4 MG PO CAPS: 0.4000 mg | ORAL_CAPSULE | Freq: Every day | ORAL | 3 refills | Status: DC

## 2020-08-05 MED ORDER — MORPHINE SULFATE ER 15 MG PO TBCR: 15.0000 mg | EXTENDED_RELEASE_TABLET | Freq: Two times a day (BID) | ORAL | 0 refills | Status: DC

## 2020-08-05 NOTE — Patient Instructions (Addendum)
Melatonin 3-5 mg at bedtime Magnesium Glycinate 200-400 mg at bedtime  Preventive Care 69 Years and Older, Male Preventive care refers to lifestyle choices and visits with your health care provider that can promote health and wellness. This includes:  A yearly physical exam. This is also called an annual wellness visit.  Regular dental and eye exams.  Immunizations.  Screening for certain conditions.  Healthy lifestyle choices, such as: ? Eating a healthy diet. ? Getting regular exercise. ? Not using drugs or products that contain nicotine and tobacco. ? Limiting alcohol use. What can I expect for my preventive care visit? Physical exam Your health care provider will check your:  Height and weight. These may be used to calculate your BMI (body mass index). BMI is a measurement that tells if you are at a healthy weight.  Heart rate and blood pressure.  Body temperature.  Skin for abnormal spots. Counseling Your health care provider may ask you questions about your:  Past medical problems.  Family's medical history.  Alcohol, tobacco, and drug use.  Emotional well-being.  Home life and relationship well-being.  Sexual activity.  Diet, exercise, and sleep habits.  History of falls.  Memory and ability to understand (cognition).  Work and work Statistician.  Access to firearms. What immunizations do I need? Vaccines are usually given at various ages, according to a schedule. Your health care provider will recommend vaccines for you based on your age, medical history, and lifestyle or other factors, such as travel or where you work.   What tests do I need? Blood tests  Lipid and cholesterol levels. These may be checked every 5 years, or more often depending on your overall health.  Hepatitis C test.  Hepatitis B test. Screening  Lung cancer screening. You may have this screening every year starting at age 55 if you have a 30-pack-year history of smoking and  currently smoke or have quit within the past 15 years.  Colorectal cancer screening. ? All adults should have this screening starting at age 40 and continuing until age 84. ? Your health care provider may recommend screening at age 56 if you are at increased risk. ? You will have tests every 1-10 years, depending on your results and the type of screening test.  Prostate cancer screening. Recommendations will vary depending on your family history and other risks.  Genital exam to check for testicular cancer or hernias.  Diabetes screening. ? This is done by checking your blood sugar (glucose) after you have not eaten for a while (fasting). ? You may have this done every 1-3 years.  Abdominal aortic aneurysm (AAA) screening. You may need this if you are a current or former smoker.  STD (sexually transmitted disease) testing, if you are at risk. Follow these instructions at home: Eating and drinking  Eat a diet that includes fresh fruits and vegetables, whole grains, lean protein, and low-fat dairy products. Limit your intake of foods with high amounts of sugar, saturated fats, and salt.  Take vitamin and mineral supplements as recommended by your health care provider.  Do not drink alcohol if your health care provider tells you not to drink.  If you drink alcohol: ? Limit how much you have to 0-2 drinks a day. ? Be aware of how much alcohol is in your drink. In the [place] one drink equals one 12 oz bottle of beer (355 mL), one 5 oz glass of wine (148 mL), or one 1 oz glass of hard liquor (  44 mL).   Lifestyle  Take daily care of your teeth and gums. Brush your teeth every morning and night with fluoride toothpaste. Floss one time each day.  Stay active. Exercise for at least 30 minutes 5 or more days each week.  Do not use any products that contain nicotine or tobacco, such as cigarettes, e-cigarettes, and chewing tobacco. If you need help quitting, ask your health care  provider.  Do not use drugs.  If you are sexually active, practice safe sex. Use a condom or other form of protection to prevent STIs (sexually transmitted infections).  Talk with your health care provider about taking a low-dose aspirin or statin.  Find healthy ways to cope with stress, such as: ? Meditation, yoga, or listening to music. ? Journaling. ? Talking to a trusted person. ? Spending time with friends and family. Safety  Always wear your seat belt while driving or riding in a vehicle.  Do not drive: ? If you have been drinking alcohol. Do not ride with someone who has been drinking. ? When you are tired or distracted. ? While texting.  Wear a helmet and other protective equipment during sports activities.  If you have firearms in your house, make sure you follow all gun safety procedures. What's next?  Visit your health care provider once a year for an annual wellness visit.  Ask your health care provider how often you should have your eyes and teeth checked.  Stay up to date on all vaccines. This information is not intended to replace advice given to you by your health care provider. Make sure you discuss any questions you have with your health care provider. Document Revised: 01/07/2019 Document Reviewed: 04/04/2018 Elsevier Patient Education  2021 [employer].

## 2020-08-05 NOTE — Assessment & Plan Note (Signed)
Follows with Dr Dorina Hoyer they are monitoring his elevated PSA, notes the Flomax helps his hesitancy but he is noting some leakage he is encouraged to discuss with urology

## 2020-08-05 NOTE — Progress Notes (Signed)
Patient ID: Gregory Carr, male    DOB: April 04, 1951  Age: 70 y.o. MRN: [id number]    Subjective:  Subjective  HPI [name] presents for presents for comprehensive physical exam today and follow up on management of chronic concerns. He reports that he is feeling well and has no recent sicknesses other than his back issues. He denies chest pain, SOB, fever, abdominal pain, cough, chills, sore throat, dysuria, HA, or N/VD. He states that due to his worsening back pain that is localized to the upper back near the neck, he was recommended back surgery. He states that he has problems with leakage when urinating that might be due to his enlarged prostate. He states that the medication flomax helps relieve the symptoms. He states that his sleep medication does not help is sleep as much as it could. He expresses concern regarding his current hernia as it is sometimes painful but not enough to warrant any actions.  Review of Systems  Constitutional: Negative for chills, fatigue and fever.  HENT: Negative for congestion, rhinorrhea, sinus pressure, sinus pain and sore throat.   Eyes: Negative for pain.  Respiratory: Negative for cough and shortness of breath.   Cardiovascular: Negative for chest pain, palpitations and leg swelling.  Gastrointestinal: Negative for abdominal pain, blood in stool, diarrhea, nausea and vomiting.  Genitourinary: Negative for flank pain, frequency and penile pain.       (+) urinary leakage  Musculoskeletal: Positive for back pain (Upper back).  Neurological: Negative for headaches.    History Past Medical History:  Diagnosis Date  . Abdominal pain, left lower quadrant 01/17/2015  . BACK PAIN, CHRONIC 11/11/2007   Qualifier: Diagnosis of  By: Redmond Pulling MD, Frann Rider    . BARRETTS ESOPHAGUS 11/11/2007   Qualifier: Diagnosis of  By: Redmond Pulling MD, Frann Rider    . BPH (benign prostatic hyperplasia) 09/13/2012  . BPH (benign prostatic hyperplasia) 09/13/2012   Follows with  [facility]   . ERECTILE DYSFUNCTION 08/16/2006   Qualifier: Diagnosis of  By: Cletus Gash MD, [place]    . GERD 08/16/2006   Qualifier: Diagnosis of  By: Cletus Gash MD, [place]    . [place] SYNDROME 01/22/2008   Qualifier: Diagnosis of  By: Redmond Pulling MD, Frann Rider    . GLUCOSE INTOLERANCE 12/11/2007   Qualifier: Diagnosis of  By: Redmond Pulling MD, Frann Rider     . Grief reaction 11/07/2015  . HTN (hypertension) 03/24/2011  . HYPERLIPIDEMIA 08/16/2006   Qualifier: Diagnosis of  By: Cletus Gash MD, [place]    . Onychomycosis 09/13/2012  . Other anxiety states 04/08/2007   Qualifier: Diagnosis of  By: Cletus Gash MD, [place]    . Preventative health care 09/29/2013  . Renal cell carcinoma ([place]) 12/15/2011   S/p left nephrectomy Follows with [facility]   . Right hip pain 07/21/2016    He has no past surgical history on file.   His family history includes Arthritis in his sister; Cancer in his daughter; Dementia in his mother; Heart disease in his maternal grandfather; Hypertension in his maternal grandfather; Stroke in his paternal grandfather.He reports that he quit smoking about 44 years ago. His smoking use included cigarettes. He has never used smokeless tobacco. He reports current alcohol use of about 1.0 - 2.0 standard drink of alcohol per week. He reports that he does not use drugs.  Current Outpatient Medications on File Prior to Visit  Medication Sig Dispense Refill  . [START ON 09/24/2020] amphetamine-dextroamphetamine (ADDERALL) 20 MG tablet Take 1 tablet (20 mg total) by  mouth 3 (three) times daily. 90 tablet 0  . [START ON 08/25/2020] amphetamine-dextroamphetamine (ADDERALL) 20 MG tablet Take 1 tablet (20 mg total) by mouth 3 (three) times daily. 90 tablet 0  . amphetamine-dextroamphetamine (ADDERALL) 20 MG tablet Take 1 tablet (20 mg total) by mouth 3 (three) times daily. 90 tablet 0  . atorvastatin (LIPITOR) 80 MG tablet TAKE 1 TABLET BY MOUTH EVERY DAY 90 tablet 0  . Coenzyme Q10 (COQ-10) 400 MG CAPS Take  400 mg by mouth daily. Reported on 10/28/2015    . esomeprazole (NEXIUM) 40 MG capsule Take 1 capsule (40 mg total) by mouth daily. 90 capsule 0  . Fiber CAPS Take by mouth daily. Reported on 10/28/2015    . lisinopril-hydrochlorothiazide (ZESTORETIC) 20-12.5 MG tablet Take 1 tablet by mouth daily. 90 tablet 3  . NUCYNTA ER 100 MG 12 hr tablet Take 2 tablets by mouth daily.    . traZODone (DESYREL) 100 MG tablet TAKE 1 TO 2 TABLETS BY MOUTH AT BEDTIME AS NEEDED FOR SLEEP 180 tablet 1  . zaleplon (SONATA) 10 MG capsule Take 1 capsule by mouth at bedtime for sleep, may repeat 1 capsule prn for mid nocturnal awakening with 3 hours left to sleep 60 capsule 2   No current facility-administered medications on file prior to visit.     Objective:  Objective  Physical Exam Constitutional:      General: He is not in acute distress.    Appearance: Normal appearance. He is not ill-appearing or toxic-appearing.  HENT:     Head: Normocephalic and atraumatic.     Right Ear: Tympanic membrane, ear canal and external ear normal.     Left Ear: Tympanic membrane, ear canal and external ear normal.     Nose: No congestion or rhinorrhea.  Eyes:     Extraocular Movements: Extraocular movements intact.     Right eye: No nystagmus.     Left eye: No nystagmus.     Pupils: Pupils are equal, round, and reactive to light.  Cardiovascular:     Rate and Rhythm: Normal rate and regular rhythm.     Pulses: Normal pulses.     Heart sounds: Normal heart sounds. No murmur heard.   Pulmonary:     Effort: Pulmonary effort is normal. No respiratory distress.     Breath sounds: Normal breath sounds. No wheezing, rhonchi or rales.  Abdominal:     General: Bowel sounds are normal.     Palpations: Abdomen is soft. There is no mass.     Tenderness: There is no abdominal tenderness. There is no guarding.     Hernia: A hernia is present.  Musculoskeletal:        General: Normal range of motion.     Cervical back: Normal  range of motion and neck supple.  Skin:    General: Skin is warm and dry.  Neurological:     Mental Status: He is alert and oriented to person, place, and time.  Psychiatric:        Behavior: Behavior normal.    BP 122/70   Pulse 76   Temp 98.4 F (36.9 C)   Resp 16   Ht 5\' 7"  (1.702 m)   Wt 165 lb 6.4 oz (75 kg)   SpO2 95%   BMI 25.91 kg/m  Wt Readings from Last 3 Encounters:  08/05/20 165 lb 6.4 oz (75 kg)  05/04/20 170 lb (77.1 kg)  04/19/20 166 lb (75.3 kg)  Lab Results  Component Value Date   WBC 8.1 08/05/2020   HGB 14.2 08/05/2020   HCT 41.0 08/05/2020   PLT 214.0 08/05/2020   GLUCOSE 89 08/05/2020   CHOL 125 08/05/2020   TRIG 94.0 08/05/2020   HDL 54.30 08/05/2020   LDLDIRECT 82.0 07/31/2014   LDLCALC 52 08/05/2020   ALT 23 08/05/2020   AST 21 08/05/2020   NA 141 08/05/2020   K 4.5 08/05/2020   CL 103 08/05/2020   CREATININE 1.05 08/05/2020   BUN 30 (H) 08/05/2020   CO2 32 08/05/2020   TSH 2.63 08/05/2020   PSA 4.37 (H) 07/31/2014   HGBA1C 5.5 02/17/2020    No results found.   Assessment & Plan:  Plan    Meds ordered this encounter  Medications  . tamsulosin (FLOMAX) 0.4 MG CAPS capsule    Sig: Take 1 capsule (0.4 mg total) by mouth daily.    Dispense:  30 capsule    Refill:  3  . morphine (MS CONTIN) 15 MG 12 hr tablet    Sig: Take 1 tablet (15 mg total) by mouth every 12 (twelve) hours. Fill when due. Not early.    Dispense:  60 tablet    Refill:  0    Problem List Items Addressed This Visit    GLUCOSE INTOLERANCE   Relevant Orders   Hemoglobin A1c   Essential hypertension    Well controlled, no changes to meds. Encouraged heart healthy diet such as the DASH diet and exercise as tolerated.       Relevant Orders   TSH (Completed)   Thoracic back pain    Encouraged moist heat and gentle stretching as tolerated. May try NSAIDs and prescription meds as directed and report if symptoms worsen or seek immediate care. Refill on  Morphine Xray of spine ordered      Relevant Medications   morphine (MS CONTIN) 15 MG 12 hr tablet   Other Relevant Orders   DG Thoracic Spine 2 View (Completed)   BPH (benign prostatic hyperplasia)    Follows with Dr Dorina Hoyer they are monitoring his elevated PSA, notes the Flomax helps his hesitancy but he is noting some leakage he is encouraged to discuss with urology      Relevant Medications   tamsulosin (FLOMAX) 0.4 MG CAPS capsule   Preventative health care - Primary    Patient encouraged to maintain heart healthy diet, regular exercise, adequate sleep. Consider daily probiotics. Take medications as prescribed. Labs ordered and reviewed. Colonoscopy 2021.       Relevant Orders   Comprehensive metabolic panel (Completed)   CBC with Differential/Platelet (Completed)   Lipid panel (Completed)   Hyperlipidemia    Tolerating statin, encouraged heart healthy diet, avoid trans fats, minimize simple carbs and saturated fats. Increase exercise as tolerated      Relevant Orders   Comprehensive metabolic panel (Completed)   CBC with Differential/Platelet (Completed)   Lipid panel (Completed)    Other Visit Diagnoses    Need for pneumococcal vaccination       Relevant Orders   Pneumococcal conjugate vaccine 20-valent (Prevnar 20) (Completed)     Colonoscopy: Last completed on 02/06/2020, results were ,   Follow-up: Return in about 3 months (around 11/04/2020).   I,David Hanna,acting as a scribe for Penni Homans, MD.,have documented all relevant documentation on the behalf of Penni Homans, MD,as directed by  Penni Homans, MD while in the presence of Penni Homans, MD.  I, Mosie Lukes, MD personally  performed the services described in this documentation. All medical record entries made by the scribe were at my direction and in my presence. I have reviewed the chart and agree that the record reflects my personal performance and is accurate and complete

## 2020-08-05 NOTE — Assessment & Plan Note (Addendum)
Encouraged moist heat and gentle stretching as tolerated. May try NSAIDs and prescription meds as directed and report if symptoms worsen or seek immediate care. Refill on Morphine Xray of spine ordered

## 2020-08-05 NOTE — Assessment & Plan Note (Signed)
Tolerating statin, encouraged heart healthy diet, avoid trans fats, minimize simple carbs and saturated fats. Increase exercise as tolerated 

## 2020-08-05 NOTE — Assessment & Plan Note (Signed)
Patient encouraged to maintain heart healthy diet, regular exercise, adequate sleep. Consider daily probiotics. Take medications as prescribed. Labs ordered and reviewed. Colonoscopy 2021.

## 2020-08-05 NOTE — Assessment & Plan Note (Signed)
Well controlled, no changes to meds. Encouraged heart healthy diet such as the DASH diet and exercise as tolerated.  °

## 2020-08-05 NOTE — Progress Notes (Signed)
   Covid-19 Vaccination Clinic  Name:  Gregory Carr    MRN: [id number] DOB: Sep 23, 1950  08/05/2020  [name] was observed post Covid-19 immunization for 15 minutes without incident. He was provided with Vaccine Information Sheet and instruction to access the V-Safe system.   [name] was instructed to call 911 with any severe reactions post vaccine: Marland Kitchen Difficulty breathing  . Swelling of face and throat  . A fast heartbeat  . A bad rash all over body  . Dizziness and weakness   Immunizations Administered    Name Date Dose VIS Date Route   PFIZER Comrnaty(Gray TOP) Covid-19 Vaccine 08/05/2020 10:33 AM 0.3 mL 04/01/2020 Intramuscular   Manufacturer: [employer], [employer]   Lot: [id number]   NDC: [phone]

## 2020-08-09 ENCOUNTER — Other Ambulatory Visit (HOSPITAL_BASED_OUTPATIENT_CLINIC_OR_DEPARTMENT_OTHER): Payer: Self-pay

## 2020-08-09 MED ORDER — PFIZER-BIONT COVID-19 VAC-TRIS 30 MCG/0.3ML IM SUSP
INTRAMUSCULAR | 0 refills | Status: DC
  Filled 2020-08-09: qty 0.3, 1d supply, fill #0

## 2020-08-11 ENCOUNTER — Other Ambulatory Visit: Payer: Self-pay

## 2020-08-11 MED ORDER — ZALEPLON 10 MG PO CAPS: ORAL_CAPSULE | ORAL | 1 refills | Status: DC

## 2020-08-13 ENCOUNTER — Other Ambulatory Visit: Payer: Self-pay

## 2020-08-13 ENCOUNTER — Telehealth: Payer: Self-pay | Admitting: Physician Assistant

## 2020-08-13 MED ORDER — ZALEPLON 10 MG PO CAPS: ORAL_CAPSULE | ORAL | 1 refills | Status: DC

## 2020-08-13 NOTE — Telephone Encounter (Signed)
Rx pended

## 2020-08-13 NOTE — Telephone Encounter (Signed)
Pt called and said that the sonata 10 mg is not at the pharmacy. It looks like on 08/11/20 it went to print instead of being escribed. Please send this ASAP as pt is out

## 2020-09-01 ENCOUNTER — Other Ambulatory Visit: Payer: Self-pay | Admitting: Family Medicine

## 2020-09-05 ENCOUNTER — Other Ambulatory Visit: Payer: Self-pay | Admitting: Family Medicine

## 2020-09-06 MED ORDER — MORPHINE SULFATE ER 15 MG PO TBCR: 15.0000 mg | EXTENDED_RELEASE_TABLET | Freq: Two times a day (BID) | ORAL | 0 refills | Status: DC

## 2020-09-06 NOTE — Telephone Encounter (Signed)
Requesting: morphine 15mg  12hr tablet Contract: None UDS: 05/04/2020 Last Visit: 08/05/2020 Next Visit: 12/09/20 Last Refill: 08/05/2020 #60 and 0RF Pt sig: 1 tab q12hr   Please Advise

## 2020-10-05 ENCOUNTER — Other Ambulatory Visit: Payer: Self-pay | Admitting: Family Medicine

## 2020-10-05 ENCOUNTER — Other Ambulatory Visit: Payer: Self-pay

## 2020-10-05 ENCOUNTER — Telehealth: Payer: Self-pay | Admitting: Physician Assistant

## 2020-10-05 MED ORDER — AMPHETAMINE-DEXTROAMPHETAMINE 20 MG PO TABS: 20.0000 mg | ORAL_TABLET | Freq: Three times a day (TID) | ORAL | 0 refills | Status: DC

## 2020-10-05 MED ORDER — MORPHINE SULFATE ER 15 MG PO TBCR: 15.0000 mg | EXTENDED_RELEASE_TABLET | Freq: Two times a day (BID) | ORAL | 0 refills | Status: DC

## 2020-10-05 MED ORDER — ESOMEPRAZOLE MAGNESIUM 40 MG PO CPDR: 40.0000 mg | DELAYED_RELEASE_CAPSULE | Freq: Every day | ORAL | 0 refills | Status: DC

## 2020-10-05 NOTE — Telephone Encounter (Signed)
Requesting: ms contin Contract: none UDS: 05/04/20 Last Visit: 08/05/20 Next Visit: 12/09/20 Last Refill: 09/06/20  Please Advise

## 2020-10-05 NOTE — Telephone Encounter (Signed)
pended

## 2020-10-05 NOTE — Telephone Encounter (Signed)
Next appt is 10/11/20. Requesting refill on Adderall 20 mg called to:  [facility], [place]  Phone:  [phone]  Fax:  [phone]

## 2020-10-06 ENCOUNTER — Telehealth: Payer: Self-pay

## 2020-10-06 ENCOUNTER — Other Ambulatory Visit: Payer: Self-pay | Admitting: Family Medicine

## 2020-10-06 ENCOUNTER — Telehealth: Payer: Self-pay | Admitting: *Deleted

## 2020-10-06 DIAGNOSIS — M25551 Pain in right hip: Secondary | ICD-10-CM

## 2020-10-06 DIAGNOSIS — G8929 Other chronic pain: Secondary | ICD-10-CM

## 2020-10-06 MED ORDER — MORPHINE SULFATE ER 15 MG PO TBCR: 15.0000 mg | EXTENDED_RELEASE_TABLET | Freq: Two times a day (BID) | ORAL | 0 refills | Status: DC

## 2020-10-06 NOTE — Telephone Encounter (Signed)
Pharmacy called would like documented plan on how to get pt off morphine.

## 2020-10-06 NOTE — Telephone Encounter (Signed)
Contact Type Call Who Is Calling Pharmacy Call Type Pharmacy Send to RN Chief Complaint Paging or Request for Consult Reason for Call Request to speak to Physician Initial Comment Caller states she has a question about a prescription sent into the pharmacy Pharmacy Name [facility] Pharmacist Name [place], Brooke Number [phone] Translation No Disp. Time Eilene Ghazi Time) Disposition Final User 10/05/2020 6:39:15 PM Clinical Call Yes Mancel Bale, RN, [place]

## 2020-10-07 NOTE — Telephone Encounter (Signed)
Called pt and pt is scheduled

## 2020-10-11 ENCOUNTER — Encounter: Payer: Self-pay | Admitting: Physician Assistant

## 2020-10-11 ENCOUNTER — Other Ambulatory Visit: Payer: Self-pay

## 2020-10-11 ENCOUNTER — Ambulatory Visit (INDEPENDENT_AMBULATORY_CARE_PROVIDER_SITE_OTHER): Payer: Federal, State, Local not specified - PPO | Admitting: Physician Assistant

## 2020-10-11 DIAGNOSIS — G4700 Insomnia, unspecified: Secondary | ICD-10-CM | POA: Diagnosis not present

## 2020-10-11 DIAGNOSIS — F902 Attention-deficit hyperactivity disorder, combined type: Secondary | ICD-10-CM

## 2020-10-11 MED ORDER — AMPHETAMINE-DEXTROAMPHETAMINE 20 MG PO TABS: 20.0000 mg | ORAL_TABLET | Freq: Three times a day (TID) | ORAL | 0 refills | Status: DC

## 2020-10-11 MED ORDER — TRAZODONE HCL 100 MG PO TABS: ORAL_TABLET | ORAL | 1 refills | Status: DC

## 2020-10-11 MED ORDER — ZALEPLON 10 MG PO CAPS: ORAL_CAPSULE | ORAL | 5 refills | Status: DC

## 2020-10-11 NOTE — Progress Notes (Signed)
[facility] Med Check  Patient ID: RED MANDT,  MRN: [id number]  PCP: Mosie Lukes, MD  Date of Evaluation: 10/11/2020 Time spent:20 minutes  Chief Complaint:  Chief Complaint   Anxiety; Depression; Insomnia; Follow-up      HISTORY/CURRENT STATUS: HPI routine med check.  He is doing well.  Energy and motivation are good.  He is able to enjoy things.  Not isolating.  Does not cry easily.  He feels like his medications are working well.  No suicidal or homicidal thoughts.  He does sleep well as long as he takes the [place] and trazodone.  If he does not take those he has trouble both falling asleep and staying asleep.  With them, he feels rested when he gets up and go on about his day.  He is retired so gets up anytime he wants and does what ever he would like to do.  States that attention is good without easy distractibility.  Able to focus on things and finish tasks to completion.  With the Adderall, he is able to stay on task and get things done, with the energy he needs.  Patient denies increased energy with decreased need for sleep, no increased talkativeness, no racing thoughts, no impulsivity or risky behaviors, no increased spending, no increased libido, no grandiosity, no increased irritability or anger, and no hallucinations.  Denies dizziness, syncope, seizures, numbness, tingling, tremor, tics, unsteady gait, slurred speech, confusion. Denies muscle or joint pain, stiffness, or dystonia.  Individual Medical History/ Review of Systems: Changes? :No    Past medications for mental health diagnoses include: Ambien, Belsomra, Remeron, Restoril, Serzone, Cymbalta, Trazodone, Effexor XR, Sonata.   Allergies: Patient has no known allergies.  Current Medications:  Current Outpatient Medications:    atorvastatin (LIPITOR) 80 MG tablet, TAKE 1 TABLET BY MOUTH EVERY DAY, Disp: 90 tablet, Rfl: 0   Coenzyme Q10 (COQ-10) 400 MG CAPS, Take 400 mg by mouth daily. Reported on  10/28/2015, Disp: , Rfl:    esomeprazole (NEXIUM) 40 MG capsule, Take 1 capsule (40 mg total) by mouth daily., Disp: 90 capsule, Rfl: 0   lisinopril-hydrochlorothiazide (ZESTORETIC) 20-12.5 MG tablet, Take 1 tablet by mouth daily., Disp: 90 tablet, Rfl: 3   morphine (MS CONTIN) 15 MG 12 hr tablet, Take 1 tablet (15 mg total) by mouth every 12 (twelve) hours. Fill when due. Not early., Disp: 60 tablet, Rfl: 0   tamsulosin (FLOMAX) 0.4 MG CAPS capsule, Take 1 capsule (0.4 mg total) by mouth daily., Disp: 30 capsule, Rfl: 3   [START ON 01/01/2021] amphetamine-dextroamphetamine (ADDERALL) 20 MG tablet, Take 1 tablet (20 mg total) by mouth 3 (three) times daily., Disp: 90 tablet, Rfl: 0   [START ON 12/03/2020] amphetamine-dextroamphetamine (ADDERALL) 20 MG tablet, Take 1 tablet (20 mg total) by mouth 3 (three) times daily., Disp: 90 tablet, Rfl: 0   [START ON 11/03/2020] amphetamine-dextroamphetamine (ADDERALL) 20 MG tablet, Take 1 tablet (20 mg total) by mouth 3 (three) times daily., Disp: 90 tablet, Rfl: 0   COVID-19 mRNA Vac-TriS, Pfizer, (PFIZER-BIONT COVID-19 VAC-TRIS) SUSP injection, Inject into the muscle., Disp: 0.3 mL, Rfl: 0   Fiber CAPS, Take by mouth daily. Reported on 10/28/2015 (Patient not taking: Reported on 10/11/2020), Disp: , Rfl:    NUCYNTA ER 100 MG 12 hr tablet, Take 2 tablets by mouth daily. (Patient not taking: Reported on 10/11/2020), Disp: , Rfl:    traZODone (DESYREL) 100 MG tablet, TAKE 1 TO 2 TABLETS BY MOUTH AT BEDTIME AS NEEDED FOR SLEEP,  Disp: 180 tablet, Rfl: 1   zaleplon (SONATA) 10 MG capsule, Take 1 capsule by mouth at bedtime for sleep, may repeat 1 capsule prn for mid nocturnal awakening with 3 hours left to sleep, Disp: 60 capsule, Rfl: 5 Medication Side Effects: none  Family Medical/ Social History: Changes? No  MENTAL HEALTH EXAM:  There were no vitals taken for this visit.There is no height or weight on file to calculate BMI.  General Appearance: Casual, Neat and Well  Groomed  Eye Contact:  Good  Speech:  Clear and Coherent and Normal Rate  Volume:  Normal  Mood:  Euthymic  Affect:  Appropriate  Thought Process:  Goal Directed and Descriptions of Associations: Intact  Orientation:  Full (Time, Place, and Person)  Thought Content: Logical   Suicidal Thoughts:  No  Homicidal Thoughts:  No  Memory:  WNL  Judgement:  Good  Insight:  Good  Psychomotor Activity:  Normal  Concentration:  Concentration: Good and Attention Span: Good  Recall:  Good  Fund of Knowledge: Good  Language: Good  Assets:  Desire for Improvement  ADL's:  Intact  Cognition: WNL  Prognosis:  Good    DIAGNOSES:    ICD-10-CM   1. Insomnia, unspecified type  G47.00     2. Attention deficit hyperactivity disorder (ADHD), combined type  F90.2        Receiving Psychotherapy: No    RECOMMENDATIONS:  PDMP was reviewed. I provided 20 minutes of face to face time during this encounter, including time spent before and after the visit in records review, medical decision making, and charting.  Continue Sonata 10 mg, 1 p.o. nightly as needed.  May repeat 1 as needed for mid nocturnal awakening as long as he has 3 hours left to sleep. continue Adderall 20 mg, 1 p.o. 3 times daily. Continue trazodone 100 mg, 1-2 nightly as needed sleep. Return in 6 months.  Donnal Moat, PA-C

## 2020-11-04 ENCOUNTER — Other Ambulatory Visit: Payer: Self-pay | Admitting: Family Medicine

## 2020-11-04 DIAGNOSIS — G8929 Other chronic pain: Secondary | ICD-10-CM

## 2020-11-04 DIAGNOSIS — M25551 Pain in right hip: Secondary | ICD-10-CM

## 2020-11-04 DIAGNOSIS — M546 Pain in thoracic spine: Secondary | ICD-10-CM

## 2020-11-04 DIAGNOSIS — M545 Low back pain, unspecified: Secondary | ICD-10-CM

## 2020-11-05 MED ORDER — MORPHINE SULFATE ER 15 MG PO TBCR: 15.0000 mg | EXTENDED_RELEASE_TABLET | Freq: Two times a day (BID) | ORAL | 0 refills | Status: DC

## 2020-11-05 NOTE — Telephone Encounter (Signed)
Requesting: ms contin Contract: none UDS: none Last Visit: 08/05/20 Next Visit: 11/15/20 Last Refill: 10/06/20  Please Advise

## 2020-11-15 ENCOUNTER — Telehealth (INDEPENDENT_AMBULATORY_CARE_PROVIDER_SITE_OTHER): Payer: Federal, State, Local not specified - PPO | Admitting: Family Medicine

## 2020-11-15 ENCOUNTER — Other Ambulatory Visit: Payer: Self-pay

## 2020-11-15 DIAGNOSIS — M549 Dorsalgia, unspecified: Secondary | ICD-10-CM | POA: Diagnosis not present

## 2020-11-15 DIAGNOSIS — M545 Low back pain, unspecified: Secondary | ICD-10-CM

## 2020-11-15 DIAGNOSIS — G4700 Insomnia, unspecified: Secondary | ICD-10-CM | POA: Diagnosis not present

## 2020-11-15 NOTE — Patient Instructions (Signed)
Melatonin 2-10 mg   Magnesium Glycinate 200 to 400 mg  L Tryptophan  Encouraged good sleep hygiene such as dark, quiet room. No blue/green glowing lights such as computer screens in bedroom. No alcohol or stimulants in evening. Cut down on caffeine as able. Regular exercise is helpful but not just prior to bed time.

## 2020-11-15 NOTE — Progress Notes (Signed)
MyChart Video Visit    Virtual Visit via Video Note   This visit type was conducted due to national recommendations for restrictions regarding the COVID-19 Pandemic (e.g. social distancing) in an effort to limit this patient's exposure and mitigate transmission in our community. This patient is at least at moderate risk for complications without adequate follow up. This format is felt to be most appropriate for this patient at this time. Physical exam was limited by quality of the video and audio technology used for the visit. S Chism, CMA was able to get the patient set up on a video visit.  Patient location: Home Patient and provider in visit Provider location: Office  I discussed the limitations of evaluation and management by telemedicine and the availability of in person appointments. The patient expressed understanding and agreed to proceed.  Visit Date:11/15/2020  Today's healthcare provider: Penni Homans, MD      Subjective:    Patient ID: Gregory Carr, male    DOB: 1950/06/19, 70 y.o.   MRN: [id number]  No chief complaint on file.   HPI Patient is in today for a video visit for reevaluation of his worsening back pain, he is interested in seeing neurosurgery again but he needs imaging first before he can go back. His pain is low and mid back. No recent fall or trauma, he notes being stressed and anxious due to pain. Denies CP/palp/SOB/HA/congestion/fevers/GI or GU c/o. Taking meds as prescribed   Past Medical History:  Diagnosis Date   Abdominal pain, left lower quadrant 01/17/2015   BACK PAIN, CHRONIC 11/11/2007   Qualifier: Diagnosis of  By: Redmond Pulling MD, Gwenyth [place]ra ESOPHAGUS 11/11/2007   Qualifier: Diagnosis of  By: Redmond Pulling MD, [place]     BPH (benign prostatic hyperplasia) 09/13/2012   BPH (benign prostatic hyperplasia) 09/13/2012   Follows with [facility]    ERECTILE DYSFUNCTION 08/16/2006   Qualifier: Diagnosis of  By: Cletus Gash MD, [place]      GERD 08/16/2006   Qualifier: Diagnosis of  By: Cletus Gash MD, Leodis [place] SYNDROME 01/22/2008   Qualifier: Diagnosis of  By: Redmond Pulling MD, [place]     GLUCOSE INTOLERANCE 12/11/2007   Qualifier: Diagnosis of  By: Redmond Pulling MD, [place]      Grief reaction 11/07/2015   HTN (hypertension) 03/24/2011   HYPERLIPIDEMIA 08/16/2006   Qualifier: Diagnosis of  By: Cletus Gash MD, [place]     Onychomycosis 09/13/2012   Other anxiety states 04/08/2007   Qualifier: Diagnosis of  By: Cletus Gash MD, [place]     Preventative health care 09/29/2013   Renal cell carcinoma ([place]) 12/15/2011   S/p left nephrectomy Follows with [facility]    Right hip pain 07/21/2016    No past surgical history on file.  Family History  Problem Relation Age of Onset   Dementia Mother    Cancer Daughter        brain cancer, glioblastoma   Hypertension Maternal Grandfather    Heart disease Maternal Grandfather        MI   Stroke Paternal Grandfather    Arthritis Sister     Social History   Socioeconomic History   Marital status: Widowed    Spouse name: Not on file   Number of children: Not on file   Years of education: Not on file   Highest education level: Not on file  Occupational History   Not on file  Tobacco Use   Smoking status: Former  Types: Cigarettes    Quit date: 08/23/1975    Years since quitting: 45.2   Smokeless tobacco: Never  Substance and Sexual Activity   Alcohol use: Yes    Alcohol/week: 1.0 - 2.0 standard drink    Types: 1 - 2 Cans of beer per week   Drug use: No   Sexual activity: Yes    Comment: lives with wife, no dietary restrictions, regular exercise  Other Topics Concern   Not on file  Social History Narrative   Jewish   Social Determinants of Health   Financial Resource Strain: Not on file  Food Insecurity: Not on file  Transportation Needs: Not on file  Physical Activity: Not on file  Stress: Not on file  Social Connections: Not on file  Intimate Partner Violence: Not on  file    Outpatient Medications Prior to Visit  Medication Sig Dispense Refill   [START ON 01/01/2021] amphetamine-dextroamphetamine (ADDERALL) 20 MG tablet Take 1 tablet (20 mg total) by mouth 3 (three) times daily. 90 tablet 0   [START ON 12/03/2020] amphetamine-dextroamphetamine (ADDERALL) 20 MG tablet Take 1 tablet (20 mg total) by mouth 3 (three) times daily. 90 tablet 0   amphetamine-dextroamphetamine (ADDERALL) 20 MG tablet Take 1 tablet (20 mg total) by mouth 3 (three) times daily. 90 tablet 0   atorvastatin (LIPITOR) 80 MG tablet TAKE 1 TABLET BY MOUTH EVERY DAY 90 tablet 0   Coenzyme Q10 (COQ-10) 400 MG CAPS Take 400 mg by mouth daily. Reported on 10/28/2015     COVID-19 mRNA Vac-TriS, Pfizer, (PFIZER-BIONT COVID-19 VAC-TRIS) SUSP injection Inject into the muscle. 0.3 mL 0   esomeprazole (NEXIUM) 40 MG capsule Take 1 capsule (40 mg total) by mouth daily. 90 capsule 0   lisinopril-hydrochlorothiazide (ZESTORETIC) 20-12.5 MG tablet Take 1 tablet by mouth daily. 90 tablet 3   morphine (MS CONTIN) 15 MG 12 hr tablet Take 1 tablet (15 mg total) by mouth every 12 (twelve) hours. Fill when due. Not early. 60 tablet 0   traZODone (DESYREL) 100 MG tablet TAKE 1 TO 2 TABLETS BY MOUTH AT BEDTIME AS NEEDED FOR SLEEP 180 tablet 1   zaleplon (SONATA) 10 MG capsule Take 1 capsule by mouth at bedtime for sleep, may repeat 1 capsule prn for mid nocturnal awakening with 3 hours left to sleep 60 capsule 5   Fiber CAPS Take by mouth daily. Reported on 10/28/2015 (Patient not taking: No sig reported)     tamsulosin (FLOMAX) 0.4 MG CAPS capsule Take 1 capsule (0.4 mg total) by mouth daily. 30 capsule 3   NUCYNTA ER 100 MG 12 hr tablet Take 2 tablets by mouth daily. (Patient not taking: No sig reported)     No facility-administered medications prior to visit.    No Known Allergies  Review of Systems  Constitutional:  Positive for malaise/fatigue. Negative for fever.  HENT:  Negative for congestion.   Eyes:   Negative for blurred vision.  Respiratory:  Negative for shortness of breath.   Cardiovascular:  Negative for chest pain, palpitations and leg swelling.  Gastrointestinal:  Negative for abdominal pain, blood in stool and nausea.  Genitourinary:  Negative for dysuria and frequency.  Musculoskeletal:  Positive for back pain. Negative for falls.  Skin:  Negative for rash.  Neurological:  Negative for dizziness, loss of consciousness and headaches.  Endo/Heme/Allergies:  Negative for environmental allergies.  Psychiatric/Behavioral:  Negative for depression. The patient is nervous/anxious.       Objective:    Physical  Exam Constitutional:      General: He is not in acute distress.    Appearance: Normal appearance. He is not ill-appearing or toxic-appearing.  HENT:     Head: Normocephalic and atraumatic.     Right Ear: External ear normal.     Left Ear: External ear normal.     Nose: Nose normal.  Eyes:     General:        Right eye: No discharge.        Left eye: No discharge.  Pulmonary:     Effort: Pulmonary effort is normal.  Skin:    Findings: No rash.  Neurological:     Mental Status: He is alert and oriented to person, place, and time.  Psychiatric:        Behavior: Behavior normal.    There were no vitals taken for this visit. Wt Readings from Last 3 Encounters:  08/05/20 165 lb 6.4 oz (75 kg)  05/04/20 170 lb (77.1 kg)  04/19/20 166 lb (75.3 kg)    Diabetic Foot Exam - Simple   No data filed    Lab Results  Component Value Date   WBC 8.1 08/05/2020   HGB 14.2 08/05/2020   HCT 41.0 08/05/2020   PLT 214.0 08/05/2020   GLUCOSE 89 08/05/2020   CHOL 125 08/05/2020   TRIG 94.0 08/05/2020   HDL 54.30 08/05/2020   LDLDIRECT 82.0 07/31/2014   LDLCALC 52 08/05/2020   ALT 23 08/05/2020   AST 21 08/05/2020   NA 141 08/05/2020   K 4.5 08/05/2020   CL 103 08/05/2020   CREATININE 1.05 08/05/2020   BUN 30 (H) 08/05/2020   CO2 32 08/05/2020   TSH 2.63 08/05/2020    PSA 4.37 (H) 07/31/2014   HGBA1C 5.5 02/17/2020    Lab Results  Component Value Date   TSH 2.63 08/05/2020   Lab Results  Component Value Date   WBC 8.1 08/05/2020   HGB 14.2 08/05/2020   HCT 41.0 08/05/2020   MCV 90.6 08/05/2020   PLT 214.0 08/05/2020   Lab Results  Component Value Date   NA 141 08/05/2020   K 4.5 08/05/2020   CO2 32 08/05/2020   GLUCOSE 89 08/05/2020   BUN 30 (H) 08/05/2020   CREATININE 1.05 08/05/2020   BILITOT 1.3 (H) 08/05/2020   ALKPHOS 77 08/05/2020   AST 21 08/05/2020   ALT 23 08/05/2020   PROT 6.6 08/05/2020   ALBUMIN 4.1 08/05/2020   CALCIUM 9.5 08/05/2020   ANIONGAP 10 01/06/2020   GFR 73.05 08/05/2020   Lab Results  Component Value Date   CHOL 125 08/05/2020   Lab Results  Component Value Date   HDL 54.30 08/05/2020   Lab Results  Component Value Date   LDLCALC 52 08/05/2020   Lab Results  Component Value Date   TRIG 94.0 08/05/2020   Lab Results  Component Value Date   CHOLHDL 2 08/05/2020   Lab Results  Component Value Date   HGBA1C 5.5 02/17/2020       Assessment & Plan:   Problem List Items Addressed This Visit     LOW BACK PAIN - Primary    He struggles with worsening low and mid back pain, he has had surgery in the past and is bad enough to consider again. He was previously seen by Dr Ellene Route of neurosurgery in the past. We have set up xrays and once those are done we can proceed with MRI in preparations to get him  back to neurosurgeon for evaluation. Continue pain meds for now.        Relevant Orders   DG Lumbar Spine 2-3 Views   INSOMNIA    Encouraged good sleep hygiene such as dark, quiet room. No blue/green glowing lights such as computer screens in bedroom. No alcohol or stimulants in evening. Cut down on caffeine as able. Regular exercise is helpful but not just prior to bed time. He is encouraged to decrease Sonata to 10 mg and then try and stop. Will work with psychiatry.        Other Visit  Diagnoses     Mid back pain       Relevant Orders   DG Thoracic Spine 2 View        No orders of the defined types were placed in this encounter.   I discussed the assessment and treatment plan with the patient. The patient was provided an opportunity to ask questions and all were answered. The patient agreed with the plan and demonstrated an understanding of the instructions.   The patient was advised to call back or seek an in-person evaluation if the symptoms worsen or if the condition fails to improve as anticipated.  I provided 20 minutes of face-to-face time during this encounter.   Penni Homans, MD [facility] [phone] (phone) [phone] (fax)  [place]

## 2020-11-17 NOTE — Assessment & Plan Note (Signed)
Encouraged good sleep hygiene such as dark, quiet room. No blue/green glowing lights such as computer screens in bedroom. No alcohol or stimulants in evening. Cut down on caffeine as able. Regular exercise is helpful but not just prior to bed time. He is encouraged to decrease Sonata to 10 mg and then try and stop. Will work with psychiatry.

## 2020-11-17 NOTE — Assessment & Plan Note (Signed)
He notes he has increased anxiety due to pain but he believes he is handling it.

## 2020-11-17 NOTE — Assessment & Plan Note (Signed)
He struggles with worsening low and mid back pain, he has had surgery in the past and is bad enough to consider again. He was previously seen by Dr Ellene Route of neurosurgery in the past. We have set up xrays and once those are done we can proceed with MRI in preparations to get him back to neurosurgeon for evaluation. Continue pain meds for now.

## 2020-11-19 ENCOUNTER — Telehealth: Payer: Self-pay

## 2020-11-19 NOTE — Telephone Encounter (Signed)
Prior Authorization submitted and approved for ZALEPLON 10 MG #180/90 DAY effective 10/20/2020-11/19/2021 with BCBC/FEP/[employer] ID# [id number]

## 2020-11-21 ENCOUNTER — Other Ambulatory Visit: Payer: Self-pay | Admitting: Family Medicine

## 2020-11-21 DIAGNOSIS — I10 Essential (primary) hypertension: Secondary | ICD-10-CM

## 2020-12-04 ENCOUNTER — Other Ambulatory Visit: Payer: Self-pay | Admitting: Family Medicine

## 2020-12-05 ENCOUNTER — Encounter: Payer: Self-pay | Admitting: Family Medicine

## 2020-12-05 DIAGNOSIS — M545 Low back pain, unspecified: Secondary | ICD-10-CM

## 2020-12-05 DIAGNOSIS — M25551 Pain in right hip: Secondary | ICD-10-CM

## 2020-12-05 DIAGNOSIS — G8929 Other chronic pain: Secondary | ICD-10-CM

## 2020-12-05 DIAGNOSIS — M546 Pain in thoracic spine: Secondary | ICD-10-CM

## 2020-12-06 ENCOUNTER — Telehealth: Payer: Self-pay

## 2020-12-06 MED ORDER — MORPHINE SULFATE ER 15 MG PO TBCR: 15.0000 mg | EXTENDED_RELEASE_TABLET | Freq: Two times a day (BID) | ORAL | 0 refills | Status: DC

## 2020-12-06 NOTE — Telephone Encounter (Signed)
Pt called concerned abt not being able to send a refill request via his mychart for morphine.  He was in a bit of a panic stating he was going to run out of the medication today and needed it s as soon as possible.  I do see where a message has been sent requesting a refill to PCP.  This is just in follow up to that request.

## 2020-12-06 NOTE — Telephone Encounter (Signed)
Pt's medications were sent in

## 2020-12-06 NOTE — Telephone Encounter (Signed)
Requesting: morphine '15mg'$  Contract: None UDS: 05/04/2020 Last Visit: 11/15/2020 Next Visit: 02/21/2021 Last Refill: 11/05/2020 #60 and 0RF  Please Advise

## 2020-12-09 ENCOUNTER — Ambulatory Visit: Payer: Federal, State, Local not specified - PPO | Admitting: Family Medicine

## 2020-12-13 ENCOUNTER — Telehealth: Payer: Self-pay | Admitting: Physician Assistant

## 2020-12-13 ENCOUNTER — Other Ambulatory Visit: Payer: Self-pay

## 2020-12-13 MED ORDER — TRAZODONE HCL 100 MG PO TABS
ORAL_TABLET | ORAL | 0 refills | Status: DC
Start: 2020-12-13 — End: 2021-04-11

## 2020-12-13 NOTE — Telephone Encounter (Signed)
Next appt is 04/11/21. Requesting refill on Trazodone called to:  [facility], [place].      Phone:  [phone]  Fax:  [phone]   Per pt request delete the other four pharmacies showing in his chart but keep [facility] above.

## 2020-12-13 NOTE — Telephone Encounter (Signed)
Rx sent 

## 2020-12-28 ENCOUNTER — Other Ambulatory Visit (HOSPITAL_BASED_OUTPATIENT_CLINIC_OR_DEPARTMENT_OTHER): Payer: Self-pay

## 2021-01-05 ENCOUNTER — Other Ambulatory Visit: Payer: Self-pay | Admitting: Family Medicine

## 2021-01-05 DIAGNOSIS — M546 Pain in thoracic spine: Secondary | ICD-10-CM

## 2021-01-05 DIAGNOSIS — G8929 Other chronic pain: Secondary | ICD-10-CM

## 2021-01-05 DIAGNOSIS — M25551 Pain in right hip: Secondary | ICD-10-CM

## 2021-01-05 MED ORDER — MORPHINE SULFATE ER 15 MG PO TBCR: 15.0000 mg | EXTENDED_RELEASE_TABLET | Freq: Two times a day (BID) | ORAL | 0 refills | Status: DC

## 2021-01-05 NOTE — Telephone Encounter (Signed)
Refill just sent can you cancel this one.  It is a duplicate.

## 2021-01-05 NOTE — Telephone Encounter (Signed)
Requesting: morphine  Contract: 05/04/20 UDS: 05/04/20 Last Visit:11/15/20 Next Visit: 02/21/21 Last Refill: 12/06/20  Please Advise

## 2021-01-09 ENCOUNTER — Other Ambulatory Visit: Payer: Self-pay | Admitting: Family Medicine

## 2021-01-10 ENCOUNTER — Telehealth: Payer: Self-pay | Admitting: Family Medicine

## 2021-01-10 ENCOUNTER — Other Ambulatory Visit: Payer: Self-pay | Admitting: Family Medicine

## 2021-01-10 DIAGNOSIS — M545 Low back pain, unspecified: Secondary | ICD-10-CM

## 2021-01-10 MED ORDER — ESOMEPRAZOLE MAGNESIUM 40 MG PO CPDR: 40.0000 mg | DELAYED_RELEASE_CAPSULE | Freq: Every day | ORAL | 0 refills | Status: DC

## 2021-01-10 NOTE — Telephone Encounter (Signed)
Please advise 

## 2021-01-10 NOTE — Telephone Encounter (Signed)
Pt came in office stating spoke with provider in July and that provider mention was putting an order for his lower back xrays, pt went to imaging downstairs and they indicated there was no orders. Pt would like order for xrays on his lower back area. Please advise. Any question contact pt at [phone].

## 2021-01-11 NOTE — Telephone Encounter (Signed)
Patient notified orders are in.

## 2021-01-24 ENCOUNTER — Telehealth: Payer: Self-pay | Admitting: Physician Assistant

## 2021-01-24 NOTE — Telephone Encounter (Signed)
Pt called for refill on Adderall.  He takes 20mg .  After calling 10-15 pharmacies, he found that the [facility] on [name] has 10mg  in stock. Pls refill or advise pt.

## 2021-01-24 NOTE — Telephone Encounter (Signed)
Please review

## 2021-01-25 ENCOUNTER — Other Ambulatory Visit: Payer: Self-pay | Admitting: Physician Assistant

## 2021-01-25 MED ORDER — AMPHETAMINE-DEXTROAMPHETAMINE 10 MG PO TABS: 20.0000 mg | ORAL_TABLET | Freq: Three times a day (TID) | ORAL | 0 refills | Status: DC

## 2021-01-25 NOTE — Telephone Encounter (Signed)
Prescription for Adderall 10 mg, 2 p.o. 3 times a day was [facility] [address].

## 2021-02-04 ENCOUNTER — Other Ambulatory Visit: Payer: Self-pay

## 2021-02-04 ENCOUNTER — Other Ambulatory Visit: Payer: Self-pay | Admitting: Family Medicine

## 2021-02-04 ENCOUNTER — Ambulatory Visit (HOSPITAL_BASED_OUTPATIENT_CLINIC_OR_DEPARTMENT_OTHER)
Admission: RE | Admit: 2021-02-04 | Discharge: 2021-02-04 | Disposition: A | Discharge status: Home / Self Care | Payer: Federal, State, Local not specified - PPO | Source: Ambulatory Visit | Attending: Family Medicine | Admitting: Family Medicine

## 2021-02-04 DIAGNOSIS — M545 Low back pain, unspecified: Secondary | ICD-10-CM | POA: Insufficient documentation

## 2021-02-04 DIAGNOSIS — M546 Pain in thoracic spine: Secondary | ICD-10-CM

## 2021-02-04 DIAGNOSIS — G8929 Other chronic pain: Secondary | ICD-10-CM

## 2021-02-04 DIAGNOSIS — M25551 Pain in right hip: Secondary | ICD-10-CM

## 2021-02-04 MED ORDER — MORPHINE SULFATE ER 15 MG PO TBCR: 15.0000 mg | EXTENDED_RELEASE_TABLET | Freq: Two times a day (BID) | ORAL | 0 refills | Status: DC

## 2021-02-04 NOTE — Telephone Encounter (Signed)
Requesting: morphine Contract: 05/04/20 UDS: 05/04/20 Last Visit: 11/15/20 Next Visit: 02/21/21 Last Refill: 01/05/21  Please Advise

## 2021-02-08 ENCOUNTER — Other Ambulatory Visit: Payer: Self-pay | Admitting: Urology

## 2021-02-08 DIAGNOSIS — R972 Elevated prostate specific antigen [PSA]: Secondary | ICD-10-CM

## 2021-02-21 ENCOUNTER — Encounter: Payer: Self-pay | Admitting: Family Medicine

## 2021-02-21 ENCOUNTER — Telehealth (INDEPENDENT_AMBULATORY_CARE_PROVIDER_SITE_OTHER): Payer: Federal, State, Local not specified - PPO | Admitting: Family Medicine

## 2021-02-21 ENCOUNTER — Other Ambulatory Visit: Payer: Self-pay

## 2021-02-21 ENCOUNTER — Telehealth: Payer: Self-pay

## 2021-02-21 VITALS — Ht 66.0 in | Wt 160.0 lb

## 2021-02-21 DIAGNOSIS — M549 Dorsalgia, unspecified: Secondary | ICD-10-CM | POA: Diagnosis not present

## 2021-02-21 DIAGNOSIS — I10 Essential (primary) hypertension: Secondary | ICD-10-CM | POA: Diagnosis not present

## 2021-02-21 DIAGNOSIS — M545 Low back pain, unspecified: Secondary | ICD-10-CM | POA: Diagnosis not present

## 2021-02-21 DIAGNOSIS — F902 Attention-deficit hyperactivity disorder, combined type: Secondary | ICD-10-CM

## 2021-02-21 DIAGNOSIS — M546 Pain in thoracic spine: Secondary | ICD-10-CM | POA: Diagnosis not present

## 2021-02-21 NOTE — Assessment & Plan Note (Addendum)
Persistent and worsening pain, xray shows significant degenerative changes and some anterolisthesis. Will proceed with CT of lumbar spine. No change in meds now. Consider referral to ortho vs neurosurgery after imaging. He does note some urinary incontinence lately and is following with urology

## 2021-02-21 NOTE — Assessment & Plan Note (Signed)
This is worsening and while the pain meds help some the pain is getting more intense and he is interested in further imaging and interventions. He had an xray which showed worsening degenerative changes. He had surgery and hardware already in his surgical spine so we will proceed with CT scan of thoracic spine

## 2021-02-21 NOTE — Telephone Encounter (Signed)
Called to get pt scheduled for a CPE in 6 m per provider.  There was no answer and left a message to call back.   63m CPE appt is around May 2023

## 2021-02-21 NOTE — Progress Notes (Signed)
MyChart Video Visit    Virtual Visit via Video Note   This visit type was conducted due to national recommendations for restrictions regarding the COVID-19 Pandemic (e.g. social distancing) in an effort to limit this patient's exposure and mitigate transmission in our community. This patient is at least at moderate risk for complications without adequate follow up. This format is felt to be most appropriate for this patient at this time. Physical exam was limited by quality of the video and audio technology used for the visit. CMA, Guadelupe Sabin was able to get the patient set up on a video visit.  Patient location: Home Patient and provider in visit Provider location: Office  I discussed the limitations of evaluation and management by telemedicine and the availability of in person appointments. The patient expressed understanding and agreed to proceed.  Visit Date: 02/21/2021  Today's healthcare provider: Penni Homans, MD    Subjective:    Patient ID: Gregory Carr, male    DOB: 05/18/1950, 70 y.o.   MRN: [id number]  Chief Complaint  Patient presents with   Medical Management of Chronic Issues    3 month follow up visit.    Back Pain    Mid and lower back pain. Ongoing issue and it has been getting worse the last month.     HPI Patient is in today for a video visit and 3 month f/u.  He reports that even though his back pain is responding to his medication, it is still not 100% resolved. Based on a recent x-ray, the disks are degenerating. He mentions that the pain is worse in the middle of his back. He denies bowel problems.   He checks his blood pressure at home sometimes. He mentions that he is able to tell when his blood pressure is higj  Past Medical History:  Diagnosis Date   Abdominal pain, left lower quadrant 01/17/2015   BACK PAIN, CHRONIC 11/11/2007   Qualifier: Diagnosis of  By: Redmond Pulling MD, Gwenyth Allegra ESOPHAGUS 11/11/2007   Qualifier: Diagnosis of  By:  Redmond Pulling MD, [place]     BPH (benign prostatic hyperplasia) 09/13/2012   BPH (benign prostatic hyperplasia) 09/13/2012   Follows with [facility]    ERECTILE DYSFUNCTION 08/16/2006   Qualifier: Diagnosis of  By: Cletus Gash MD, [place]     GERD 08/16/2006   Qualifier: Diagnosis of  By: Cletus Gash MD, Leodis [place] SYNDROME 01/22/2008   Qualifier: Diagnosis of  By: Redmond Pulling MD, [place]     GLUCOSE INTOLERANCE 12/11/2007   Qualifier: Diagnosis of  By: Redmond Pulling MD, [place]      Grief reaction 11/07/2015   HTN (hypertension) 03/24/2011   HYPERLIPIDEMIA 08/16/2006   Qualifier: Diagnosis of  By: Cletus Gash MD, [place]     Onychomycosis 09/13/2012   Other anxiety states 04/08/2007   Qualifier: Diagnosis of  By: Cletus Gash MD, [place]     Preventative health care 09/29/2013   Renal cell carcinoma ([place]) 12/15/2011   S/p left nephrectomy Follows with [facility]    Right hip pain 07/21/2016    No past surgical history on file.  Family History  Problem Relation Age of Onset   Dementia Mother    Cancer Daughter        brain cancer, glioblastoma   Hypertension Maternal Grandfather    Heart disease Maternal Grandfather        MI   Stroke Paternal Grandfather    Arthritis Sister     Social  History   Socioeconomic History   Marital status: Widowed    Spouse name: Not on file   Number of children: Not on file   Years of education: Not on file   Highest education level: Not on file  Occupational History   Not on file  Tobacco Use   Smoking status: Former    Types: Cigarettes    Quit date: 08/23/1975    Years since quitting: 45.5   Smokeless tobacco: Never  Substance and Sexual Activity   Alcohol use: Yes    Alcohol/week: 1.0 - 2.0 standard drink    Types: 1 - 2 Cans of beer per week   Drug use: No   Sexual activity: Yes    Comment: lives with wife, no dietary restrictions, regular exercise  Other Topics Concern   Not on file  Social History Narrative   Jewish   Social Determinants of  Health   Financial Resource Strain: Not on file  Food Insecurity: Not on file  Transportation Needs: Not on file  Physical Activity: Not on file  Stress: Not on file  Social Connections: Not on file  Intimate Partner Violence: Not on file    Outpatient Medications Prior to Visit  Medication Sig Dispense Refill   amphetamine-dextroamphetamine (ADDERALL) 10 MG tablet Take 2 tablets (20 mg total) by mouth 3 (three) times daily. 180 tablet 0   atorvastatin (LIPITOR) 80 MG tablet TAKE 1 TABLET BY MOUTH EVERY DAY 90 tablet 0   Coenzyme Q10 (COQ-10) 400 MG CAPS Take 400 mg by mouth daily. Reported on 10/28/2015     esomeprazole (NEXIUM) 40 MG capsule Take 1 capsule (40 mg total) by mouth daily. 90 capsule 0   lisinopril-hydrochlorothiazide (ZESTORETIC) 20-12.5 MG tablet Take 1 tablet by mouth once daily 90 tablet 1   morphine (MS CONTIN) 15 MG 12 hr tablet Take 1 tablet (15 mg total) by mouth every 12 (twelve) hours. Fill when due. Not early. 60 tablet 0   traZODone (DESYREL) 100 MG tablet TAKE 1 TO 2 TABLETS BY MOUTH AT BEDTIME AS NEEDED FOR SLEEP 180 tablet 0   zaleplon (SONATA) 10 MG capsule Take 1 capsule by mouth at bedtime for sleep, may repeat 1 capsule prn for mid nocturnal awakening with 3 hours left to sleep 60 capsule 5   alfuzosin (UROXATRAL) 10 MG 24 hr tablet Take 10 mg by mouth daily.     amphetamine-dextroamphetamine (ADDERALL) 20 MG tablet Take 1 tablet (20 mg total) by mouth 3 (three) times daily. (Patient not taking: Reported on 02/21/2021) 90 tablet 0   amphetamine-dextroamphetamine (ADDERALL) 20 MG tablet Take 1 tablet (20 mg total) by mouth 3 (three) times daily. (Patient not taking: Reported on 02/21/2021) 90 tablet 0   amphetamine-dextroamphetamine (ADDERALL) 20 MG tablet Take 1 tablet (20 mg total) by mouth 3 (three) times daily. (Patient not taking: Reported on 02/21/2021) 90 tablet 0   COVID-19 mRNA Vac-TriS, Pfizer, (PFIZER-BIONT COVID-19 VAC-TRIS) SUSP injection Inject  into the muscle. 0.3 mL 0   Fiber CAPS Take by mouth daily. Reported on 10/28/2015 (Patient not taking: No sig reported)     tamsulosin (FLOMAX) 0.4 MG CAPS capsule Take 1 capsule (0.4 mg total) by mouth daily. (Patient not taking: Reported on 02/21/2021) 30 capsule 3   No facility-administered medications prior to visit.    No Known Allergies  Review of Systems  Musculoskeletal:  Positive for back pain (middle).      Objective:    Physical Exam Constitutional:  General: He is not in acute distress.    Appearance: Normal appearance.  Neurological:     Mental Status: He is alert.  Psychiatric:        Mood and Affect: Mood normal.        Behavior: Behavior normal.    Ht 5\' 6"  (1.676 m)   Wt 160 lb (72.6 kg)   BMI 25.82 kg/m  Wt Readings from Last 3 Encounters:  02/21/21 160 lb (72.6 kg)  08/05/20 165 lb 6.4 oz (75 kg)  05/04/20 170 lb (77.1 kg)    Diabetic Foot Exam - Simple   No data filed    Lab Results  Component Value Date   WBC 8.1 08/05/2020   HGB 14.2 08/05/2020   HCT 41.0 08/05/2020   PLT 214.0 08/05/2020   GLUCOSE 89 08/05/2020   CHOL 125 08/05/2020   TRIG 94.0 08/05/2020   HDL 54.30 08/05/2020   LDLDIRECT 82.0 07/31/2014   LDLCALC 52 08/05/2020   ALT 23 08/05/2020   AST 21 08/05/2020   NA 141 08/05/2020   K 4.5 08/05/2020   CL 103 08/05/2020   CREATININE 1.05 08/05/2020   BUN 30 (H) 08/05/2020   CO2 32 08/05/2020   TSH 2.63 08/05/2020   PSA 4.37 (H) 07/31/2014   HGBA1C 5.5 02/17/2020    Lab Results  Component Value Date   TSH 2.63 08/05/2020   Lab Results  Component Value Date   WBC 8.1 08/05/2020   HGB 14.2 08/05/2020   HCT 41.0 08/05/2020   MCV 90.6 08/05/2020   PLT 214.0 08/05/2020   Lab Results  Component Value Date   NA 141 08/05/2020   K 4.5 08/05/2020   CO2 32 08/05/2020   GLUCOSE 89 08/05/2020   BUN 30 (H) 08/05/2020   CREATININE 1.05 08/05/2020   BILITOT 1.3 (H) 08/05/2020   ALKPHOS 77 08/05/2020   AST 21  08/05/2020   ALT 23 08/05/2020   PROT 6.6 08/05/2020   ALBUMIN 4.1 08/05/2020   CALCIUM 9.5 08/05/2020   ANIONGAP 10 01/06/2020   GFR 73.05 08/05/2020   Lab Results  Component Value Date   CHOL 125 08/05/2020   Lab Results  Component Value Date   HDL 54.30 08/05/2020   Lab Results  Component Value Date   LDLCALC 52 08/05/2020   Lab Results  Component Value Date   TRIG 94.0 08/05/2020   Lab Results  Component Value Date   CHOLHDL 2 08/05/2020   Lab Results  Component Value Date   HGBA1C 5.5 02/17/2020       Assessment & Plan:   Problem List Items Addressed This Visit     Essential hypertension    Monitor and report any concerns, no changes to meds. Encouraged heart healthy diet such as the DASH diet and exercise as tolerated.       Attention deficit hyperactivity disorder (ADHD)    Doing well on current meds.       LOW BACK PAIN    Persistent and worsening pain, xray shows significant degenerative changes and some anterolisthesis. Will proceed with CT of lumbar spine. No change in meds now. Consider referral to ortho vs neurosurgery after imaging.       Relevant Orders   CT THORACIC SPINE W CONTRAST   CT Lumbar Spine W Contrast   Thoracic back pain    This is worsening and while the pain meds help some the pain is getting more intense and he is interested in further imaging and  interventions. He had an xray which showed worsening degenerative changes. He had surgery and hardware already in his surgical spine so we will proceed with CT scan of thoracic spine      Other Visit Diagnoses     Mid back pain    -  Primary   Relevant Orders   CT THORACIC SPINE W CONTRAST        No orders of the defined types were placed in this encounter.   I discussed the assessment and treatment plan with the patient. The patient was provided an opportunity to ask questions and all were answered. The patient agreed with the plan and demonstrated an understanding of the  instructions.   The patient was advised to call back or seek an in-person evaluation if the symptoms worsen or if the condition fails to improve as anticipated.  I provided 20 minutes of face-to-face time during this encounter.   I,Zite Okoli,acting as a Education administrator for Penni Homans, MD.,have documented all relevant documentation on the behalf of Penni Homans, MD,as directed by  Penni Homans, MD while in the presence of Penni Homans, MD.   I, Mosie Lukes, MD personally performed the services described in this documentation. All medical record entries made by the scribe were at my direction and in my presence. I have reviewed the chart and agree that the record reflects my personal performance and is accurate and complete     Penni Homans, MD [facility] [place] (phone) [phone] (fax)  [place]

## 2021-02-21 NOTE — Assessment & Plan Note (Signed)
Doing well on current meds.

## 2021-02-21 NOTE — Assessment & Plan Note (Signed)
Monitor and report any concerns, no changes to meds. Encouraged heart healthy diet such as the DASH diet and exercise as tolerated.  ?

## 2021-02-22 ENCOUNTER — Telehealth: Payer: Self-pay | Admitting: *Deleted

## 2021-02-22 DIAGNOSIS — E739 Lactose intolerance, unspecified: Secondary | ICD-10-CM

## 2021-02-22 DIAGNOSIS — I10 Essential (primary) hypertension: Secondary | ICD-10-CM

## 2021-02-22 DIAGNOSIS — E782 Mixed hyperlipidemia: Secondary | ICD-10-CM

## 2021-02-22 DIAGNOSIS — E7439 Other disorders of intestinal carbohydrate absorption: Secondary | ICD-10-CM

## 2021-02-22 NOTE — Telephone Encounter (Signed)
-----   Message from Mosie Lukes, MD sent at 02/22/2021 12:56 PM EDT ----- Regarding: RE: Patient needs labs Please set him up with the same labs he did in April 2022 and let him know. Then let radiology know when his labs are done. thanks ----- Message ----- From: Antonieta Loveless Sent: 02/22/2021  10:34 AM EDT To: Mosie Lukes, MD Subject: Patient needs labs                             Hi Dr. Charlett Blake,  Patient needs to have current labs drawn before scheduling his MRI. Can you please let me know when he's completed and I will get him on the schedule asap.   Thanks Cecille Rubin

## 2021-02-23 ENCOUNTER — Telehealth (INDEPENDENT_AMBULATORY_CARE_PROVIDER_SITE_OTHER): Payer: Federal, State, Local not specified - PPO | Admitting: Registered Nurse

## 2021-02-23 ENCOUNTER — Other Ambulatory Visit: Payer: Self-pay

## 2021-02-23 ENCOUNTER — Encounter: Payer: Self-pay | Admitting: Registered Nurse

## 2021-02-23 ENCOUNTER — Other Ambulatory Visit: Payer: Self-pay | Admitting: Family Medicine

## 2021-02-23 VITALS — Wt 160.0 lb

## 2021-02-23 DIAGNOSIS — J029 Acute pharyngitis, unspecified: Secondary | ICD-10-CM

## 2021-02-23 DIAGNOSIS — R0981 Nasal congestion: Secondary | ICD-10-CM | POA: Diagnosis not present

## 2021-02-23 DIAGNOSIS — J988 Other specified respiratory disorders: Secondary | ICD-10-CM | POA: Diagnosis not present

## 2021-02-23 DIAGNOSIS — R051 Acute cough: Secondary | ICD-10-CM | POA: Diagnosis not present

## 2021-02-23 DIAGNOSIS — B9689 Other specified bacterial agents as the cause of diseases classified elsewhere: Secondary | ICD-10-CM

## 2021-02-23 DIAGNOSIS — M545 Low back pain, unspecified: Secondary | ICD-10-CM

## 2021-02-23 MED ORDER — AMOXICILLIN-POT CLAVULANATE 875-125 MG PO TABS: 1.0000 | ORAL_TABLET | Freq: Two times a day (BID) | ORAL | 0 refills | Status: DC

## 2021-02-23 MED ORDER — AZELASTINE HCL 0.1 % NA SOLN
1.0000 | Freq: Two times a day (BID) | NASAL | 12 refills | Status: AC
Start: 2021-02-23 — End: ?

## 2021-02-23 MED ORDER — DM-GUAIFENESIN ER 30-600 MG PO TB12: 1.0000 | ORAL_TABLET | Freq: Two times a day (BID) | ORAL | 0 refills | Status: DC

## 2021-02-23 MED ORDER — CHLORASEPTIC 1.4 % MT LIQD: 1.0000 | OROMUCOSAL | 0 refills | Status: DC | PRN

## 2021-02-23 NOTE — Progress Notes (Signed)
Telemedicine Encounter- SOAP NOTE Established Patient  This video encounter was conducted with the patient's (or proxy's) verbal consent via audio telecommunications: yes/no: Yes Patient was instructed to have this encounter in a suitably private space; and to only have persons present to whom they give permission to participate. In addition, patient identity was confirmed by use of name plus two identifiers (DOB and address).  I discussed the limitations, risks, security and privacy concerns of performing an evaluation and management service by telephone and the availability of in person appointments. I also discussed with the patient that there may be a patient responsible charge related to this service. The patient expressed understanding and agreed to proceed.  I spent a total of 16 minutes talking with the patient or their proxy.  Patient at home Provider in office  Participants: Kathrin Ruddy, NP and Merleen Milliner  Chief Complaint  Patient presents with   Sore Throat    Patient states he has been congested , sore throat, coughing up mucus and just feeling bad for 4 days. Patient states he has not taken any medications at this time, but has took a covid test that was negative.    Subjective   Gregory Carr is a 70 y.o. established patient. Video visit today for sore throat  HPI Onset 4-5 days ago Start with sore throat, lower respiratory congestion worse at night. Some pain with swallowing. No dysphagia or choking.  No shob, doe, chest pain, trouble breathing  Some coughing, productive when he does.  Some sinus congesiton, nose congestion.  No sick contacts  Negative COVID test    Patient Active Problem List   Diagnosis Date Noted   Diarrhea 01/30/2020   GAD (generalized anxiety disorder) 05/24/2018   Leg cramps 24/40/1027   Umbilical hernia without obstruction and without gangrene 05/13/2018   Right hip pain 07/21/2016   Grief reaction 11/07/2015   Abdominal  pain, left lower quadrant 01/17/2015   Hyperlipidemia 01/26/2014   Preventative health care 09/29/2013   Hallux limitus of left foot 11/08/2012   Onychomycosis 09/13/2012   BPH (benign prostatic hyperplasia) 09/13/2012   Renal cell carcinoma ([place]) 12/15/2011   FATIGUE 10/04/2009   MYALGIA 10/12/2008   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   GLUCOSE INTOLERANCE 12/11/2007   OTHER DECREASED WHITE BLOOD CELL COUNT 12/11/2007   Jaundice, non-neonatal 12/11/2007   BARRETTS ESOPHAGUS 11/11/2007   Thoracic back pain 11/11/2007   Inguinal hernia 05/13/2007   OTHER ANXIETY STATES 04/08/2007   APHTHOUS ULCERS 04/08/2007   Essential hypertension 08/16/2006   ERECTILE DYSFUNCTION 08/16/2006   Attention deficit hyperactivity disorder (ADHD) 08/16/2006   GERD 08/16/2006   LOW BACK PAIN 08/16/2006   INSOMNIA 08/16/2006    Past Medical History:  Diagnosis Date   Abdominal pain, left lower quadrant 01/17/2015   BACK PAIN, CHRONIC 11/11/2007   Qualifier: Diagnosis of  By: Redmond Pulling MD, Gwenyth Allegra ESOPHAGUS 11/11/2007   Qualifier: Diagnosis of  By: Redmond Pulling MD, [place]     BPH (benign prostatic hyperplasia) 09/13/2012   BPH (benign prostatic hyperplasia) 09/13/2012   Follows with [facility]    ERECTILE DYSFUNCTION 08/16/2006   Qualifier: Diagnosis of  By: Cletus Gash MD, [place]     GERD 08/16/2006   Qualifier: Diagnosis of  By: Cletus Gash MD, Leodis [place] SYNDROME 01/22/2008   Qualifier: Diagnosis of  By: Redmond Pulling MD, [place]     GLUCOSE INTOLERANCE 12/11/2007   Qualifier: Diagnosis of  By: Redmond Pulling  MD, [place]ann [place]      Grief reaction 11/07/2015   HTN (hypertension) 03/24/2011   HYPERLIPIDEMIA 08/16/2006   Qualifier: Diagnosis of  By: Cletus Gash MD, [place]     Onychomycosis 09/13/2012   Other anxiety states 04/08/2007   Qualifier: Diagnosis of  By: Cletus Gash MD, [place]     Preventative health care 09/29/2013   Renal cell carcinoma ([place]) 12/15/2011   S/p left nephrectomy Follows with  [facility]    Right hip pain 07/21/2016    Current Outpatient Medications  Medication Sig Dispense Refill   amoxicillin-clavulanate (AUGMENTIN) 875-125 MG tablet Take 1 tablet by mouth 2 (two) times daily. 20 tablet 0   azelastine (ASTELIN) 0.1 % nasal spray Place 1 spray into both nostrils 2 (two) times daily. Use in each nostril as directed 30 mL 12   dextromethorphan-guaiFENesin (MUCINEX DM) 30-600 MG 12hr tablet Take 1 tablet by mouth 2 (two) times daily. 20 tablet 0   phenol (CHLORASEPTIC) 1.4 % LIQD Use as directed 1 spray in the mouth or throat as needed for throat irritation / pain. 29 mL 0   alfuzosin (UROXATRAL) 10 MG 24 hr tablet Take 10 mg by mouth daily.     amphetamine-dextroamphetamine (ADDERALL) 10 MG tablet Take 2 tablets (20 mg total) by mouth 3 (three) times daily. 180 tablet 0   amphetamine-dextroamphetamine (ADDERALL) 20 MG tablet Take 1 tablet (20 mg total) by mouth 3 (three) times daily. (Patient not taking: Reported on 02/21/2021) 90 tablet 0   amphetamine-dextroamphetamine (ADDERALL) 20 MG tablet Take 1 tablet (20 mg total) by mouth 3 (three) times daily. (Patient not taking: Reported on 02/21/2021) 90 tablet 0   amphetamine-dextroamphetamine (ADDERALL) 20 MG tablet Take 1 tablet (20 mg total) by mouth 3 (three) times daily. (Patient not taking: Reported on 02/21/2021) 90 tablet 0   atorvastatin (LIPITOR) 80 MG tablet TAKE 1 TABLET BY MOUTH EVERY DAY 90 tablet 0   Coenzyme Q10 (COQ-10) 400 MG CAPS Take 400 mg by mouth daily. Reported on 10/28/2015     esomeprazole (NEXIUM) 40 MG capsule Take 1 capsule (40 mg total) by mouth daily. 90 capsule 0   lisinopril-hydrochlorothiazide (ZESTORETIC) 20-12.5 MG tablet Take 1 tablet by mouth once daily 90 tablet 1   morphine (MS CONTIN) 15 MG 12 hr tablet Take 1 tablet (15 mg total) by mouth every 12 (twelve) hours. Fill when due. Not early. 60 tablet 0   traZODone (DESYREL) 100 MG tablet TAKE 1 TO 2 TABLETS BY MOUTH AT BEDTIME AS  NEEDED FOR SLEEP 180 tablet 0   zaleplon (SONATA) 10 MG capsule Take 1 capsule by mouth at bedtime for sleep, may repeat 1 capsule prn for mid nocturnal awakening with 3 hours left to sleep 60 capsule 5   No current facility-administered medications for this visit.    No Known Allergies  Social History   Socioeconomic History   Marital status: Widowed    Spouse name: Not on file   Number of children: Not on file   Years of education: Not on file   Highest education level: Not on file  Occupational History   Not on file  Tobacco Use   Smoking status: Former    Types: Cigarettes    Quit date: 08/23/1975    Years since quitting: 45.5   Smokeless tobacco: Never  Substance and Sexual Activity   Alcohol use: Yes    Alcohol/week: 1.0 - 2.0 standard drink    Types: 1 - 2 Cans of beer per week  Drug use: No   Sexual activity: Yes    Comment: lives with wife, no dietary restrictions, regular exercise  Other Topics Concern   Not on file  Social History Narrative   Jewish   Social Determinants of Health   Financial Resource Strain: Not on file  Food Insecurity: Not on file  Transportation Needs: Not on file  Physical Activity: Not on file  Stress: Not on file  Social Connections: Not on file  Intimate Partner Violence: Not on file    ROS  Objective   Vitals as reported by the patient: Today's Vitals   02/23/21 1335  Weight: 160 lb (72.6 kg)    Piers was seen today for sore throat.  Diagnoses and all orders for this visit:  Bacterial respiratory infection -     amoxicillin-clavulanate (AUGMENTIN) 875-125 MG tablet; Take 1 tablet by mouth 2 (two) times daily.  Acute cough -     dextromethorphan-guaiFENesin (MUCINEX DM) 30-600 MG 12hr tablet; Take 1 tablet by mouth 2 (two) times daily.  Sore throat -     phenol (CHLORASEPTIC) 1.4 % LIQD; Use as directed 1 spray in the mouth or throat as needed for throat irritation / pain.  Nasal congestion -     azelastine  (ASTELIN) 0.1 % nasal spray; Place 1 spray into both nostrils 2 (two) times daily. Use in each nostril as directed   PLAN Supportive care as above. Discussed nonpharm If worsening or failing to improve in 2 days, start augmentin as above. ER precautions discussed and given in visit summary. Patient encouraged to call clinic with any questions, comments, or concerns.  I discussed the assessment and treatment plan with the patient. The patient was provided an opportunity to ask questions and all were answered. The patient agreed with the plan and demonstrated an understanding of the instructions.   The patient was advised to call back or seek an in-person evaluation if the symptoms worsen or if the condition fails to improve as anticipated.  I provided 16 minutes of face-to-face time during this encounter.  Maximiano Coss, NP

## 2021-02-23 NOTE — Telephone Encounter (Signed)
Called patient to set up lab only appointment, but had to leave message to call back.  Also patient needs to be set up for cpe around 08/2021.  Future orders placed.

## 2021-02-23 NOTE — Patient Instructions (Addendum)
Mr. Villamar -   Doristine Devoid to speak with you.  Start azelastine nasal spray, mucinex-dm, and chloraseptic spray for relief. Hydrate well, rest up, and get routine meals.   If no improvement in 2-3 days, start augmentin twice daily for 10 days.   If any alarming symptoms arise, or if: breathing gets suddenly worse, you spike a fever that will not come down, you have vomiting or diarrhea that won't stop, or any other major red flags, please seek in person assessment with ER or Urgent Care.  Thank you, hope you feel better soon,  Rich     If you have lab work done today you will be contacted with your lab results within the next 2 weeks.  If you have not heard from [place] then please contact us. The fastest way to get your results is to register for My Chart.   IF you received an x-ray today, you will receive an invoice from [facility]. Please contact [facility] at [phone] with questions or concerns regarding your invoice.   IF you received labwork today, you will receive an invoice from [place]. Please contact [facility] at [phone] with questions or concerns regarding your invoice.   Our billing staff will not be able to assist you with questions regarding bills from these companies.  You will be contacted with the lab results as soon as they are available. The fastest way to get your results is to activate your My Chart account. Instructions are located on the last page of this paperwork. If you have not heard from [place] regarding the results in 2 weeks, please contact this office.

## 2021-02-25 ENCOUNTER — Other Ambulatory Visit: Payer: Self-pay

## 2021-02-25 ENCOUNTER — Ambulatory Visit (HOSPITAL_BASED_OUTPATIENT_CLINIC_OR_DEPARTMENT_OTHER)
Admission: RE | Admit: 2021-02-25 | Discharge: 2021-02-25 | Disposition: A | Discharge status: Home / Self Care | Payer: Federal, State, Local not specified - PPO | Source: Ambulatory Visit | Attending: Family Medicine | Admitting: Family Medicine

## 2021-02-25 DIAGNOSIS — M549 Dorsalgia, unspecified: Secondary | ICD-10-CM

## 2021-02-25 DIAGNOSIS — M545 Low back pain, unspecified: Secondary | ICD-10-CM | POA: Diagnosis present

## 2021-03-06 ENCOUNTER — Other Ambulatory Visit: Payer: Self-pay | Admitting: Family Medicine

## 2021-03-06 DIAGNOSIS — M25551 Pain in right hip: Secondary | ICD-10-CM

## 2021-03-06 DIAGNOSIS — G8929 Other chronic pain: Secondary | ICD-10-CM

## 2021-03-06 DIAGNOSIS — M546 Pain in thoracic spine: Secondary | ICD-10-CM

## 2021-03-07 MED ORDER — MORPHINE SULFATE ER 15 MG PO TBCR: 15.0000 mg | EXTENDED_RELEASE_TABLET | Freq: Two times a day (BID) | ORAL | 0 refills | Status: DC

## 2021-03-07 NOTE — Telephone Encounter (Signed)
Requesting: morphine Contract: 05/04/20 UDS: 05/04/20 Last Visit: 02/21/21 Next Visit: none Last Refill: 02/04/21  Please Advise

## 2021-03-10 ENCOUNTER — Other Ambulatory Visit: Payer: Self-pay | Admitting: Family Medicine

## 2021-04-05 ENCOUNTER — Other Ambulatory Visit: Payer: Self-pay | Admitting: Family Medicine

## 2021-04-05 ENCOUNTER — Other Ambulatory Visit: Payer: Self-pay | Admitting: Physician Assistant

## 2021-04-05 DIAGNOSIS — M546 Pain in thoracic spine: Secondary | ICD-10-CM

## 2021-04-05 DIAGNOSIS — M545 Low back pain, unspecified: Secondary | ICD-10-CM

## 2021-04-05 DIAGNOSIS — M25551 Pain in right hip: Secondary | ICD-10-CM

## 2021-04-05 DIAGNOSIS — G8929 Other chronic pain: Secondary | ICD-10-CM

## 2021-04-05 MED ORDER — AMPHETAMINE-DEXTROAMPHETAMINE 20 MG PO TABS: 20.0000 mg | ORAL_TABLET | Freq: Three times a day (TID) | ORAL | 0 refills | Status: DC

## 2021-04-05 MED ORDER — MORPHINE SULFATE ER 15 MG PO TBCR: 15.0000 mg | EXTENDED_RELEASE_TABLET | Freq: Two times a day (BID) | ORAL | 0 refills | Status: DC

## 2021-04-05 NOTE — Telephone Encounter (Signed)
Pended to the requested pharmacy..

## 2021-04-05 NOTE — Telephone Encounter (Signed)
Requesting: morphine 15mg  12hr tablet Contract: None UDS: 05/04/2020  Last Visit: 02/21/2021 Next Visit: None Last Refill: 03/07/2021 #60 and 0RF  Please Advise

## 2021-04-05 NOTE — Telephone Encounter (Signed)
Pt is out of his adderall. [facility] [address] They have it in stock

## 2021-04-06 ENCOUNTER — Other Ambulatory Visit: Payer: Self-pay | Admitting: Family Medicine

## 2021-04-11 ENCOUNTER — Other Ambulatory Visit: Payer: Self-pay

## 2021-04-11 ENCOUNTER — Ambulatory Visit (INDEPENDENT_AMBULATORY_CARE_PROVIDER_SITE_OTHER): Payer: Federal, State, Local not specified - PPO | Admitting: Physician Assistant

## 2021-04-11 ENCOUNTER — Encounter: Payer: Self-pay | Admitting: Physician Assistant

## 2021-04-11 DIAGNOSIS — G4700 Insomnia, unspecified: Secondary | ICD-10-CM

## 2021-04-11 DIAGNOSIS — F902 Attention-deficit hyperactivity disorder, combined type: Secondary | ICD-10-CM

## 2021-04-11 MED ORDER — ZALEPLON 10 MG PO CAPS: ORAL_CAPSULE | ORAL | 5 refills | Status: DC

## 2021-04-11 NOTE — Progress Notes (Signed)
[facility]  Patient ID: Gregory Carr,  MRN: [id number]  PCP: Mosie Lukes, MD  Date of Evaluation: 04/11/2021 Time spent:20 minutes  Chief Complaint:  Chief Complaint   ADHD; Insomnia; Follow-up      HISTORY/CURRENT STATUS: HPI routine med check.  Gregory Carr is doing well.  The Adderall continues to help with focus and increased energy and motivation.  He is having trouble finding the 20 mg at times due to the [employer].  We have had to send in 10 mg on occasion.  The only real problem he has is trouble sleeping.  He is taking the Sonata for that and it is effective.  If he does not take it he has trouble going to sleep and staying asleep sometimes.  Patient denies loss of interest in usual activities and is able to enjoy things.  Denies decreased energy or motivation.  Appetite has not changed.  No extreme sadness, tearfulness, or feelings of hopelessness.  He is sad this time of year, missing his wife who passed away several years ago.  The holidays are hard.  But he has 2 adult daughters and they visit and help out with the loneliness.  Denies suicidal or homicidal thoughts.  Patient denies increased energy with decreased need for sleep, no increased talkativeness, no racing thoughts, no impulsivity or risky behaviors, no increased spending, no grandiosity, no increased irritability or anger, and no hallucinations.  Denies dizziness, syncope, seizures, numbness, tingling, tremor, tics, unsteady gait, slurred speech, confusion. Denies muscle or joint pain, stiffness, or dystonia.  Individual Medical History/ Review of Systems: Changes? :No    Past medications for mental health diagnoses include: Ambien, Belsomra, Remeron, Restoril, Serzone, Cymbalta, Trazodone, Effexor XR, Sonata.   Allergies: Patient has no known allergies.  Current Medications:  Current Outpatient Medications:    alfuzosin (UROXATRAL) 10 MG 24 hr tablet, Take 10 mg by mouth daily.,  Disp: , Rfl:    amphetamine-dextroamphetamine (ADDERALL) 20 MG tablet, Take 1 tablet (20 mg total) by mouth 3 (three) times daily., Disp: 90 tablet, Rfl: 0   amphetamine-dextroamphetamine (ADDERALL) 20 MG tablet, Take 1 tablet (20 mg total) by mouth 3 (three) times daily., Disp: 90 tablet, Rfl: 0   amphetamine-dextroamphetamine (ADDERALL) 20 MG tablet, Take 1 tablet (20 mg total) by mouth 3 (three) times daily., Disp: 90 tablet, Rfl: 0   [START ON 05/05/2021] amphetamine-dextroamphetamine (ADDERALL) 20 MG tablet, Take 1 tablet (20 mg total) by mouth 3 (three) times daily., Disp: 90 tablet, Rfl: 0   [START ON 06/03/2021] amphetamine-dextroamphetamine (ADDERALL) 20 MG tablet, Take 1 tablet (20 mg total) by mouth 3 (three) times daily., Disp: 90 tablet, Rfl: 0   atorvastatin (LIPITOR) 80 MG tablet, TAKE 1 TABLET BY MOUTH EVERY DAY, Disp: 90 tablet, Rfl: 1   Coenzyme Q10 (COQ-10) 400 MG CAPS, Take 400 mg by mouth daily. Reported on 10/28/2015, Disp: , Rfl:    esomeprazole (NEXIUM) 40 MG capsule, Take 1 capsule by mouth once daily, Disp: 90 capsule, Rfl: 1   lisinopril-hydrochlorothiazide (ZESTORETIC) 20-12.5 MG tablet, Take 1 tablet by mouth once daily, Disp: 90 tablet, Rfl: 1   morphine (MS CONTIN) 15 MG 12 hr tablet, Take 1 tablet (15 mg total) by mouth every 12 (twelve) hours. Fill when due. Not early., Disp: 60 tablet, Rfl: 0   amoxicillin-clavulanate (AUGMENTIN) 875-125 MG tablet, Take 1 tablet by mouth 2 (two) times daily., Disp: 20 tablet, Rfl: 0   amphetamine-dextroamphetamine (ADDERALL) 10 MG tablet, Take 2 tablets (20  mg total) by mouth 3 (three) times daily. (Patient not taking: Reported on 04/11/2021), Disp: 180 tablet, Rfl: 0   azelastine (ASTELIN) 0.1 % nasal spray, Place 1 spray into both nostrils 2 (two) times daily. Use in each nostril as directed (Patient not taking: Reported on 04/11/2021), Disp: 30 mL, Rfl: 12   dextromethorphan-guaiFENesin (MUCINEX DM) 30-600 MG 12hr tablet, Take 1 tablet  by mouth 2 (two) times daily. (Patient not taking: Reported on 04/11/2021), Disp: 20 tablet, Rfl: 0   phenol (CHLORASEPTIC) 1.4 % LIQD, Use as directed 1 spray in the mouth or throat as needed for throat irritation / pain. (Patient not taking: Reported on 04/11/2021), Disp: 29 mL, Rfl: 0   zaleplon (SONATA) 10 MG capsule, Take 1 capsule by mouth at bedtime for sleep, may repeat 1 capsule prn for mid nocturnal awakening with 3 hours left to sleep, Disp: 60 capsule, Rfl: 5 Medication Side Effects: none  Family Medical/ Social History: Changes? No  MENTAL HEALTH EXAM:  There were no vitals taken for this visit.There is no height or weight on file to calculate BMI.  General Appearance: Casual, Neat and Well Groomed  Eye Contact:  Good  Speech:  Clear and Coherent and Normal Rate  Volume:  Normal  Mood:  Euthymic  Affect:  Appropriate  Thought Process:  Goal Directed and Descriptions of Associations: Circumstantial  Orientation:  Full (Time, Place, and Person)  Thought Content: Logical   Suicidal Thoughts:  No  Homicidal Thoughts:  No  Memory:  WNL  Judgement:  Good  Insight:  Good  Psychomotor Activity:  Normal  Concentration:  Concentration: Good and Attention Span: Good  Recall:  Good  Fund of Knowledge: Good  Language: Good  Assets:  Desire for Improvement  ADL's:  Intact  Cognition: WNL  Prognosis:  Good    DIAGNOSES:    ICD-10-CM   1. Attention deficit hyperactivity disorder (ADHD), combined type  F90.2     2. Insomnia, unspecified type  G47.00         Receiving Psychotherapy: No    RECOMMENDATIONS:  PDMP was reviewed.  Last Adderall filled 04/05/2021.  Last Sonata filled 04/07/2021. I provided 20 minutes of face to face time during this encounter, including time spent before and after the visit in records review, medical decision making, counseling pertinent to today's visit, and charting.  He is doing well so no changes in treatment are necessary. Continue  Sonata 10 mg, 1 p.o. nightly as needed.  May repeat 1 as needed for mid nocturnal awakening as long as he has 3 hours left to sleep. Continue Adderall 20 mg, 1 p.o. 3 times daily. Return in 6 months.  Donnal Moat, PA-C

## 2021-04-26 ENCOUNTER — Other Ambulatory Visit: Payer: Self-pay

## 2021-04-26 ENCOUNTER — Ambulatory Visit
Admission: RE | Admit: 2021-04-26 | Discharge: 2021-04-26 | Disposition: A | Discharge status: Home / Self Care | Payer: Federal, State, Local not specified - PPO | Source: Ambulatory Visit | Attending: Urology | Admitting: Urology

## 2021-04-26 DIAGNOSIS — R972 Elevated prostate specific antigen [PSA]: Secondary | ICD-10-CM

## 2021-04-26 MED ORDER — GADOBENATE DIMEGLUMINE 529 MG/ML IV SOLN
15.0000 mL | Freq: Once | INTRAVENOUS | Status: AC | PRN
  Administered 2021-04-26: 15 mL via INTRAVENOUS

## 2021-05-05 ENCOUNTER — Other Ambulatory Visit: Payer: Self-pay | Admitting: Family Medicine

## 2021-05-05 DIAGNOSIS — M25551 Pain in right hip: Secondary | ICD-10-CM

## 2021-05-05 DIAGNOSIS — M545 Low back pain, unspecified: Secondary | ICD-10-CM

## 2021-05-05 DIAGNOSIS — G8929 Other chronic pain: Secondary | ICD-10-CM

## 2021-05-05 MED ORDER — MORPHINE SULFATE ER 15 MG PO TBCR: 15.0000 mg | EXTENDED_RELEASE_TABLET | Freq: Two times a day (BID) | ORAL | 0 refills | Status: DC

## 2021-05-05 NOTE — Telephone Encounter (Signed)
Requesting: morphine Contract: 05/04/20 UDS: 05/04/20 Last Visit: 02/21/21 Next Visit: none Last Refill: 04/05/21  Please Advise

## 2021-05-06 ENCOUNTER — Telehealth: Payer: Self-pay | Admitting: Physician Assistant

## 2021-05-06 NOTE — Telephone Encounter (Signed)
Refills at pharmacy.

## 2021-05-06 NOTE — Telephone Encounter (Signed)
Next visit is 10/11/21. Requesting refill on Adderall 20 mg called to:  [id number] [address], [place]  Phone:  [phone]  Fax:  [phone]    Patient confirmed they have Adderall 20 mg in stock.

## 2021-05-27 ENCOUNTER — Other Ambulatory Visit: Payer: Self-pay | Admitting: Family Medicine

## 2021-05-27 DIAGNOSIS — I10 Essential (primary) hypertension: Secondary | ICD-10-CM

## 2021-06-05 ENCOUNTER — Other Ambulatory Visit: Payer: Self-pay | Admitting: Family Medicine

## 2021-06-05 DIAGNOSIS — M25551 Pain in right hip: Secondary | ICD-10-CM

## 2021-06-05 DIAGNOSIS — G8929 Other chronic pain: Secondary | ICD-10-CM

## 2021-06-05 DIAGNOSIS — M545 Low back pain, unspecified: Secondary | ICD-10-CM

## 2021-06-05 DIAGNOSIS — M546 Pain in thoracic spine: Secondary | ICD-10-CM

## 2021-06-06 MED ORDER — MORPHINE SULFATE ER 15 MG PO TBCR: 15.0000 mg | EXTENDED_RELEASE_TABLET | Freq: Two times a day (BID) | ORAL | 0 refills | Status: DC

## 2021-06-06 NOTE — Telephone Encounter (Signed)
Requesting: morphine Contract:  05/04/20 UDS: 05/04/20 Last Visit: 02/03/21 Next Visit: 11/15/21 Last Refill: 05/05/21  Please Advise

## 2021-06-10 ENCOUNTER — Telehealth: Payer: Self-pay | Admitting: Physician Assistant

## 2021-06-10 NOTE — Telephone Encounter (Signed)
Pt called and said that he needs his adderall [facility] [address]

## 2021-06-10 NOTE — Telephone Encounter (Signed)
Pended.

## 2021-06-17 ENCOUNTER — Telehealth: Payer: Self-pay

## 2021-06-17 NOTE — Telephone Encounter (Signed)
Prior authorization initiated and approved thru [facility]. [employer]  [facility]  Effective 05/18/21 - 06/17/22.  Adderall 20 mg TID

## 2021-07-05 ENCOUNTER — Other Ambulatory Visit: Payer: Self-pay | Admitting: Family Medicine

## 2021-07-05 DIAGNOSIS — G8929 Other chronic pain: Secondary | ICD-10-CM

## 2021-07-05 DIAGNOSIS — M545 Low back pain, unspecified: Secondary | ICD-10-CM

## 2021-07-05 DIAGNOSIS — M25551 Pain in right hip: Secondary | ICD-10-CM

## 2021-07-05 MED ORDER — MORPHINE SULFATE ER 15 MG PO TBCR: 15.0000 mg | EXTENDED_RELEASE_TABLET | Freq: Two times a day (BID) | ORAL | 0 refills | Status: DC

## 2021-07-05 NOTE — Telephone Encounter (Signed)
Requesting: morphine '15mg'$   ?Contract: None ?UDS: 05/04/2020 ?Last Visit: 02/21/2021 ?Next Visit: 11/15/2021 ?Last Refill: 06/06/2021 #60 and 0RF ? ?Please Advise ? ?

## 2021-07-15 ENCOUNTER — Other Ambulatory Visit: Payer: Self-pay

## 2021-07-15 ENCOUNTER — Telehealth: Payer: Self-pay | Admitting: Physician Assistant

## 2021-07-15 MED ORDER — AMPHETAMINE-DEXTROAMPHETAMINE 20 MG PO TABS: 20.0000 mg | ORAL_TABLET | Freq: Three times a day (TID) | ORAL | 0 refills | Status: DC

## 2021-07-15 NOTE — Telephone Encounter (Signed)
Pended. I don't see that we sent an Rx at all for March. I told patient I would pend one today for the requested pharmacy.  ?

## 2021-07-15 NOTE — Telephone Encounter (Signed)
Next visit is 10-11-21. [name] called and said his RX for Dextramphetamine was sent to [facility] but should have been sent to: ? ?[id number] [place] [place] ?Phone:  [phone] ?Fax:  [phone] ? ? ? ?[facility] at their location.  ? ? ? ? ?

## 2021-07-18 ENCOUNTER — Other Ambulatory Visit: Payer: Self-pay | Admitting: Family Medicine

## 2021-07-22 ENCOUNTER — Other Ambulatory Visit: Payer: Self-pay

## 2021-07-22 ENCOUNTER — Telehealth: Payer: Self-pay | Admitting: Physician Assistant

## 2021-07-22 MED ORDER — AMPHETAMINE-DEXTROAMPHETAMINE 30 MG PO TABS: 30.0000 mg | ORAL_TABLET | Freq: Every day | ORAL | 0 refills | Status: DC

## 2021-07-22 NOTE — Telephone Encounter (Signed)
Pt lvm at 6:30 am that his pharmacy only has 30 mg of adderall in stock. So please cancel 20 mg script and [facility] [address].His phone number is [phone] ?

## 2021-07-22 NOTE — Telephone Encounter (Signed)
Canceled and sent new Rx ?

## 2021-07-22 NOTE — Telephone Encounter (Signed)
Please cancel the 20 mg and pend a 1 month supply of the 30 mg. Thank you

## 2021-07-22 NOTE — Telephone Encounter (Signed)
1 at breakfast and 1 around 12-1 PM.

## 2021-08-03 ENCOUNTER — Telehealth: Payer: Self-pay | Admitting: Family Medicine

## 2021-08-03 DIAGNOSIS — G8929 Other chronic pain: Secondary | ICD-10-CM

## 2021-08-03 DIAGNOSIS — M25551 Pain in right hip: Secondary | ICD-10-CM

## 2021-08-03 DIAGNOSIS — M545 Low back pain, unspecified: Secondary | ICD-10-CM

## 2021-08-03 MED ORDER — MORPHINE SULFATE ER 15 MG PO TBCR: 15.0000 mg | EXTENDED_RELEASE_TABLET | Freq: Two times a day (BID) | ORAL | 0 refills | Status: DC

## 2021-08-03 NOTE — Telephone Encounter (Signed)
Medicine consistently refilled by PCP, PDMP okay, Rx sent ?

## 2021-08-03 NOTE — Telephone Encounter (Signed)
Requesting: morphine '15mg'$  ?Contract: 05/04/20 ?UDS: 05/04/20 ?Last Visit: 02/03/21 ?Next Visit: 11/15/21 ?Last Refill: 07/05/21 ? ?Please Advise  ?

## 2021-08-04 ENCOUNTER — Ambulatory Visit: Payer: Federal, State, Local not specified - PPO | Admitting: Family Medicine

## 2021-08-05 ENCOUNTER — Ambulatory Visit: Payer: Federal, State, Local not specified - PPO | Admitting: Family Medicine

## 2021-08-05 ENCOUNTER — Encounter: Payer: Self-pay | Admitting: Family Medicine

## 2021-08-05 VITALS — BP 137/85 | HR 74 | Ht 66.0 in | Wt 166.6 lb

## 2021-08-05 DIAGNOSIS — Z79899 Other long term (current) drug therapy: Secondary | ICD-10-CM

## 2021-08-05 DIAGNOSIS — G8929 Other chronic pain: Secondary | ICD-10-CM | POA: Diagnosis not present

## 2021-08-05 DIAGNOSIS — M545 Low back pain, unspecified: Secondary | ICD-10-CM | POA: Diagnosis not present

## 2021-08-05 MED ORDER — CYCLOBENZAPRINE HCL 5 MG PO TABS
5.0000 mg | ORAL_TABLET | Freq: Three times a day (TID) | ORAL | 1 refills | Status: DC | PRN
Start: 2021-08-05 — End: 2024-03-05

## 2021-08-05 MED ORDER — PREDNISONE 10 MG (48) PO TBPK: ORAL_TABLET | Freq: Every day | ORAL | 0 refills | Status: DC

## 2021-08-05 NOTE — Patient Instructions (Signed)
Let's try a steroid taper and muscle relaxer (you have used these before, but make sure it doesn't make you too drowsy).  ?Due for urine drug screen ?

## 2021-08-05 NOTE — Progress Notes (Signed)
? ?Acute Office Visit ? ?Subjective:  ? ? Patient ID: Gregory Carr, male    DOB: 1950/12/10, 71 y.o.   MRN: [id number] ? ?CC: Back pain ? ? ?HPI ?Patient is in today for chronic low back pain.  ? ?Patient reports history of DDD with surgery about 25 years ago.  States that pain has been progressively getting worse and he has poor quality of life due to the pain.  Reports that the pain is constant throughout the day but does get worse with prolonged sitting and improves with lying down.  Pain stays around 8/10 on average, does improve slightly with his morphine but he reports " I just cannot take it anymore."  Reports the majority of the pain is in his thoracic spine and lower back.  He denies any pain to palpation, numbness, tingling, incontinence, radiation of pain.  States that he has an appointment with a spine doctor in 2 weeks to see if any further surgeries are an option. ? ?Reports that he has been doing regular home exercises prescribed by physical therapy which may help temporarily.  He does not want to go to physical therapy again.  He is already taking 15 mg of morphine twice a day with only minimal relief now. ? ? ? ? ?Past Medical History:  ?Diagnosis Date  ? Abdominal pain, left lower quadrant 01/17/2015  ? BACK PAIN, CHRONIC 11/11/2007  ? Qualifier: Diagnosis of  By: Redmond Pulling MD, Frann Rider    ? BARRETTS ESOPHAGUS 11/11/2007  ? Qualifier: Diagnosis of  By: Redmond Pulling MD, Frann Rider    ? BPH (benign prostatic hyperplasia) 09/13/2012  ? BPH (benign prostatic hyperplasia) 09/13/2012  ? Follows with [facility]   ? ERECTILE DYSFUNCTION 08/16/2006  ? Qualifier: Diagnosis of  By: Cletus Gash MD, [place]    ? GERD 08/16/2006  ? Qualifier: Diagnosis of  By: Cletus Gash MD, [place]    ? 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 Qualifier: Diagnosis of  By: Redmond Pulling MD, Frann Rider    ? GLUCOSE INTOLERANCE 12/11/2007  ? Qualifier: Diagnosis of  By: Redmond Pulling MD, Frann Rider     ? Grief reaction 11/07/2015  ? HTN (hypertension) 03/24/2011  ?  HYPERLIPIDEMIA 08/16/2006  ? Qualifier: Diagnosis of  By: Cletus Gash MD, [place]    ? Onychomycosis 09/13/2012  ? Other anxiety states 04/08/2007  ? Qualifier: Diagnosis of  By: Cletus Gash MD, [place]    ? Preventative health care 09/29/2013  ? Renal cell carcinoma ([place]) 12/15/2011  ? S/p left nephrectomy Follows with [facility]   ? Right hip pain 07/21/2016  ? ? ?No past surgical history on file. ? ?Family History  ?Problem Relation Age of Onset  ? Dementia Mother   ? Cancer Daughter   ?     brain cancer, glioblastoma  ? Hypertension Maternal Grandfather   ? Heart disease Maternal Grandfather   ?     MI  ? Stroke Paternal Grandfather   ? Arthritis Sister   ? ? ?Social History  ? ?Socioeconomic History  ? Marital status: Widowed  ?  Spouse name: Not on file  ? Number of children: Not on file  ? Years of education: Not on file  ? Highest education level: Not on file  ?Occupational History  ? Not on file  ?Tobacco Use  ? Smoking status: Former  ?  Types: Cigarettes  ?  Quit date: 08/23/1975  ?  Years since quitting: 45.9  ? Smokeless tobacco: Never  ?Substance and Sexual Activity  ? Alcohol use: Yes  ?  Alcohol/week: 1.0 - 2.0 standard drink  ?  Types: 1 - 2 Cans of beer per week  ? Drug use: No  ? Sexual activity: Yes  ?  Comment: lives with wife, no dietary restrictions, regular exercise  ?Other Topics Concern  ? Not on file  ?Social History Narrative  ? Jewish  ? ?Social Determinants of Health  ? ?Financial Resource Strain: Not on file  ?Food Insecurity: Not on file  ?Transportation Needs: Not on file  ?Physical Activity: Not on file  ?Stress: Not on file  ?Social Connections: Not on file  ?Intimate Partner Violence: Not on file  ? ? ?Outpatient Medications Prior to Visit  ?Medication Sig Dispense Refill  ? alfuzosin (UROXATRAL) 10 MG 24 hr tablet Take 10 mg by mouth daily.    ? amoxicillin-clavulanate (AUGMENTIN) 875-125 MG tablet Take 1 tablet by mouth 2 (two) times daily. 20 tablet 0  ? amphetamine-dextroamphetamine  (ADDERALL) 10 MG tablet Take 2 tablets (20 mg total) by mouth 3 (three) times daily. 180 tablet 0  ? amphetamine-dextroamphetamine (ADDERALL) 20 MG tablet Take 1 tablet (20 mg total) by mouth 3 (three) times daily. 90 tablet 0  ? amphetamine-dextroamphetamine (ADDERALL) 20 MG tablet Take 1 tablet (20 mg total) by mouth 3 (three) times daily. 90 tablet 0  ? amphetamine-dextroamphetamine (ADDERALL) 20 MG tablet Take 1 tablet (20 mg total) by mouth 3 (three) times daily. 90 tablet 0  ? amphetamine-dextroamphetamine (ADDERALL) 20 MG tablet Take 1 tablet (20 mg total) by mouth 3 (three) times daily. 90 tablet 0  ? amphetamine-dextroamphetamine (ADDERALL) 20 MG tablet Take 1 tablet (20 mg total) by mouth 3 (three) times daily. 90 tablet 0  ? amphetamine-dextroamphetamine (ADDERALL) 30 MG tablet Take 1 tablet by mouth daily. Take 1 at breakfast and 1 around 12-1 PM. 30 tablet 0  ? atorvastatin (LIPITOR) 80 MG tablet TAKE 1 TABLET BY MOUTH EVERY DAY 90 tablet 1  ? azelastine (ASTELIN) 0.1 % nasal spray Place 1 spray into both nostrils 2 (two) times daily. Use in each nostril as directed 30 mL 12  ? Coenzyme Q10 (COQ-10) 400 MG CAPS Take 400 mg by mouth daily. Reported on 10/28/2015    ? esomeprazole (NEXIUM) 40 MG capsule Take 1 capsule by mouth once daily 90 capsule 1  ? lisinopril-hydrochlorothiazide (ZESTORETIC) 20-12.5 MG tablet Take 1 tablet by mouth once daily 90 tablet 0  ? morphine (MS CONTIN) 15 MG 12 hr tablet Take 1 tablet (15 mg total) by mouth every 12 (twelve) hours. Fill when due. Not early. 60 tablet 0  ? phenol (CHLORASEPTIC) 1.4 % LIQD Use as directed 1 spray in the mouth or throat as needed for throat irritation / pain. 29 mL 0  ? zaleplon (SONATA) 10 MG capsule Take 1 capsule by mouth at bedtime for sleep, may repeat 1 capsule prn for mid nocturnal awakening with 3 hours left to sleep 60 capsule 5  ? ?No facility-administered medications prior to visit.  ? ? ?No Known Allergies ? ?Review of Systems ?All  review of systems negative except what is listed in the HPI ? ?   ?Objective:  ?  ?Physical Exam ?Vitals reviewed.  ?Constitutional:   ?   Appearance: Normal appearance. He is normal weight.  ?Musculoskeletal:     ?   General: No swelling, tenderness or signs of injury. Normal range of motion.  ?   Right lower leg: No edema.  ?   Left lower leg: No edema.  ?Skin: ?  General: Skin is warm and dry.  ?Neurological:  ?   General: No focal deficit present.  ?   Mental Status: He is alert and oriented to person, place, and time. Mental status is at baseline.  ?   Gait: Gait normal.  ?Psychiatric:     ?   Mood and Affect: Mood normal.     ?   Behavior: Behavior normal.     ?   Thought Content: Thought content normal.     ?   Judgment: Judgment normal.  ? ? ?BP 137/85   Pulse 74   Ht '5\' 6"'$  (1.676 m)   Wt 166 lb 9.6 oz (75.6 kg)   BMI 26.89 kg/m?  ?Wt Readings from Last 3 Encounters:  ?08/05/21 166 lb 9.6 oz (75.6 kg)  ?02/23/21 160 lb (72.6 kg)  ?02/21/21 160 lb (72.6 kg)  ? ? ?Health Maintenance Due  ?Topic Date Due  ? Zoster Vaccines- Shingrix (1 of 2) Never done  ? COVID-19 Vaccine (2 - Pfizer risk series) 08/26/2020  ? ? ?There are no preventive care reminders to display for this patient. ? ? ?Lab Results  ?Component Value Date  ? TSH 2.63 08/05/2020  ? ?Lab Results  ?Component Value Date  ? WBC 8.1 08/05/2020  ? HGB 14.2 08/05/2020  ? HCT 41.0 08/05/2020  ? MCV 90.6 08/05/2020  ? PLT 214.0 08/05/2020  ? ?Lab Results  ?Component Value Date  ? NA 141 08/05/2020  ? K 4.5 08/05/2020  ? CO2 32 08/05/2020  ? GLUCOSE 89 08/05/2020  ? BUN 30 (H) 08/05/2020  ? CREATININE 1.05 08/05/2020  ? BILITOT 1.3 (H) 08/05/2020  ? ALKPHOS 77 08/05/2020  ? AST 21 08/05/2020  ? ALT 23 08/05/2020  ? PROT 6.6 08/05/2020  ? ALBUMIN 4.1 08/05/2020  ? CALCIUM 9.5 08/05/2020  ? ANIONGAP 10 01/06/2020  ? GFR 73.05 08/05/2020  ? ?Lab Results  ?Component Value Date  ? CHOL 125 08/05/2020  ? ?Lab Results  ?Component Value Date  ? HDL 54.30  08/05/2020  ? ?Lab Results  ?Component Value Date  ? [place] 52 08/05/2020  ? ?Lab Results  ?Component Value Date  ? TRIG 94.0 08/05/2020  ? ?Lab Results  ?Component Value Date  ? CHOLHDL 2 08/05/2020  ? ?Lab Results  ?Component

## 2021-08-08 ENCOUNTER — Other Ambulatory Visit: Payer: Self-pay | Admitting: Physician Assistant

## 2021-08-08 ENCOUNTER — Telehealth: Payer: Self-pay | Admitting: Physician Assistant

## 2021-08-08 MED ORDER — AMPHETAMINE-DEXTROAMPHETAMINE 20 MG PO TABS: 20.0000 mg | ORAL_TABLET | Freq: Three times a day (TID) | ORAL | 0 refills | Status: DC

## 2021-08-08 NOTE — Telephone Encounter (Signed)
Pt called requesting Adderall 20 mg 3/d @ [facility]. Have in stock. Apt 6/20 ? ?

## 2021-08-08 NOTE — Telephone Encounter (Signed)
One prescription sent. ?

## 2021-08-12 ENCOUNTER — Other Ambulatory Visit: Payer: Federal, State, Local not specified - PPO

## 2021-08-19 ENCOUNTER — Other Ambulatory Visit: Payer: Federal, State, Local not specified - PPO

## 2021-08-23 ENCOUNTER — Other Ambulatory Visit: Payer: Federal, State, Local not specified - PPO

## 2021-08-23 DIAGNOSIS — Z79899 Other long term (current) drug therapy: Secondary | ICD-10-CM

## 2021-08-25 ENCOUNTER — Other Ambulatory Visit: Payer: Self-pay | Admitting: Physician Assistant

## 2021-08-25 ENCOUNTER — Other Ambulatory Visit: Payer: Self-pay | Admitting: Family Medicine

## 2021-08-25 DIAGNOSIS — I10 Essential (primary) hypertension: Secondary | ICD-10-CM

## 2021-08-26 LAB — DRUG MONITORING, PANEL 8 WITH CONFIRMATION, URINE
6 Acetylmorphine: NEGATIVE ng/mL (ref <10)
Alcohol Metabolites: NEGATIVE ng/mL (ref <500)
Amphetamine: 9898 ng/mL — ABNORMAL HIGH (ref <250)
Amphetamines: POSITIVE ng/mL — AB (ref <500)
Benzodiazepines: NEGATIVE ng/mL (ref <100)
Buprenorphine, Urine: NEGATIVE ng/mL (ref <5)
Cocaine Metabolite: NEGATIVE ng/mL (ref <150)
Codeine: NEGATIVE ng/mL (ref <50)
Creatinine: 105.5 mg/dL (ref >=20.0)
Hydrocodone: NEGATIVE ng/mL (ref <50)
Hydromorphone: 75 ng/mL — ABNORMAL HIGH (ref <50)
MDMA: NEGATIVE ng/mL (ref <500)
Marijuana Metabolite: NEGATIVE ng/mL (ref <20)
Methamphetamine: NEGATIVE ng/mL (ref <250)
Morphine: >10000 ng/mL — ABNORMAL HIGH (ref <50)
Norhydrocodone: NEGATIVE ng/mL (ref <50)
Opiates: POSITIVE ng/mL — AB (ref <100)
Oxidant: NEGATIVE ug/mL (ref <200)
Oxycodone: NEGATIVE ng/mL (ref <100)
pH: 6.8 (ref 4.5–9.0)

## 2021-08-26 LAB — DM TEMPLATE

## 2021-08-31 ENCOUNTER — Other Ambulatory Visit: Payer: Self-pay | Admitting: Physician Assistant

## 2021-09-04 ENCOUNTER — Other Ambulatory Visit: Payer: Self-pay | Admitting: Internal Medicine

## 2021-09-04 DIAGNOSIS — M545 Low back pain, unspecified: Secondary | ICD-10-CM

## 2021-09-04 DIAGNOSIS — M25551 Pain in right hip: Secondary | ICD-10-CM

## 2021-09-04 DIAGNOSIS — G8929 Other chronic pain: Secondary | ICD-10-CM

## 2021-09-05 ENCOUNTER — Telehealth: Payer: Self-pay | Admitting: Physician Assistant

## 2021-09-05 ENCOUNTER — Other Ambulatory Visit: Payer: Self-pay | Admitting: Physician Assistant

## 2021-09-05 MED ORDER — MORPHINE SULFATE ER 15 MG PO TBCR: 15.0000 mg | EXTENDED_RELEASE_TABLET | Freq: Two times a day (BID) | ORAL | 0 refills | Status: DC

## 2021-09-05 MED ORDER — TRAZODONE HCL 100 MG PO TABS: ORAL_TABLET | ORAL | 5 refills | Status: DC

## 2021-09-05 NOTE — Telephone Encounter (Signed)
Prescription sent

## 2021-09-05 NOTE — Telephone Encounter (Signed)
Pt LVM @ 1:39p for refill of Trazadone to be sent to [facility] on [address].   ? ?Next appt 6/20 ?

## 2021-09-05 NOTE — Telephone Encounter (Signed)
Requesting: morphine ?Contract:  ?UDS: 08/23/21 ?Last Visit: 05/14/20 ?Next Visit: 11/15/21 ?Last Refill: 08/03/21 ? ?Please Advise ? ?

## 2021-09-08 ENCOUNTER — Telehealth: Payer: Self-pay | Admitting: Physician Assistant

## 2021-09-08 ENCOUNTER — Other Ambulatory Visit: Payer: Self-pay | Admitting: Physician Assistant

## 2021-09-08 MED ORDER — AMPHETAMINE-DEXTROAMPHETAMINE 20 MG PO TABS: 20.0000 mg | ORAL_TABLET | Freq: Three times a day (TID) | ORAL | 0 refills | Status: DC

## 2021-09-08 NOTE — Telephone Encounter (Signed)
Pt called at 1:28 pm and said that he needs a refill on his sonata to be sent to the [facility] on [address]

## 2021-09-08 NOTE — Telephone Encounter (Signed)
Patient does have RF available of Sonata. He also asked about RF on Adderall 20 mg. He last filled 4/17. He will call back with a pharmacy.

## 2021-09-08 NOTE — Telephone Encounter (Signed)
Prescription sent

## 2021-09-08 NOTE — Telephone Encounter (Signed)
See note 09/05/2021 concerning the trazodone and Sonata.  If the [employer] is effective it is okay for him to stay on it, he should have 1 refill left.  Please confirm with him and double check if I should send a refill.  Thank you.

## 2021-09-08 NOTE — Telephone Encounter (Signed)
Pt called back.  [facility]mg'$  [facility] [address]

## 2021-09-12 ENCOUNTER — Other Ambulatory Visit: Payer: Self-pay | Admitting: Family Medicine

## 2021-10-03 ENCOUNTER — Other Ambulatory Visit: Payer: Self-pay | Admitting: Family Medicine

## 2021-10-03 DIAGNOSIS — M25551 Pain in right hip: Secondary | ICD-10-CM

## 2021-10-03 DIAGNOSIS — G8929 Other chronic pain: Secondary | ICD-10-CM

## 2021-10-03 MED ORDER — MORPHINE SULFATE ER 15 MG PO TBCR: 15.0000 mg | EXTENDED_RELEASE_TABLET | Freq: Two times a day (BID) | ORAL | 0 refills | Status: DC

## 2021-10-03 NOTE — Telephone Encounter (Signed)
Requesting: morphine '15mg'$   Contract: None UDS: 05/04/21 Last Visit: 08/05/21 w/ Lovena Le Next Visit: 11/15/21 Last Refill: 09/05/21 #60 and 0RF  Please Advise

## 2021-10-11 ENCOUNTER — Encounter: Payer: Self-pay | Admitting: Physician Assistant

## 2021-10-11 ENCOUNTER — Ambulatory Visit: Payer: Federal, State, Local not specified - PPO | Admitting: Physician Assistant

## 2021-10-11 DIAGNOSIS — F902 Attention-deficit hyperactivity disorder, combined type: Secondary | ICD-10-CM

## 2021-10-11 DIAGNOSIS — F32A Depression, unspecified: Secondary | ICD-10-CM | POA: Diagnosis not present

## 2021-10-11 DIAGNOSIS — G4700 Insomnia, unspecified: Secondary | ICD-10-CM | POA: Diagnosis not present

## 2021-10-11 MED ORDER — ZALEPLON 10 MG PO CAPS: ORAL_CAPSULE | ORAL | 5 refills | Status: DC

## 2021-10-11 MED ORDER — AMPHETAMINE-DEXTROAMPHETAMINE 20 MG PO TABS: 20.0000 mg | ORAL_TABLET | Freq: Three times a day (TID) | ORAL | 0 refills | Status: DC

## 2021-10-11 MED ORDER — TRAZODONE HCL 100 MG PO TABS: 50.0000 mg | ORAL_TABLET | Freq: Every evening | ORAL | 1 refills | Status: DC | PRN

## 2021-10-11 NOTE — Progress Notes (Signed)
[facility]  Patient ID: Gregory Carr,  MRN: [id number]  PCP: Mosie Lukes, MD  Date of Evaluation: 10/11/2021 Time spent:20 minutes  Chief Complaint:  Chief Complaint   ADHD; Insomnia; Follow-up    HISTORY/CURRENT STATUS: HPI routine med check.  Doing well.  He has had some trouble getting the Adderall because of the [employer].  But he is now getting it at [facility] and has not had problems there.  It is still working very well.  He is able to focus and get things done when he needs to.  He is retired but without a stimulant he is not able to get anything done.  He enjoys spending time with family and friends.  He has not really been anywhere since his wife died 5 years ago.  Does not want to go by his self.  Energy and motivation are good.  He does not cry easily.  Not isolating.  His daughter has moved in with him and he is enjoying that.  ADLs and personal hygiene are normal.  Appetite is normal and weight is stable.  No suicidal or homicidal thoughts.  The trazodone has helped him with depression and also helps with sleep.  He stopped drinking caffeine after 5 PM and that has helped him sleep even more.  He does need the Sonata every night or else he cannot fall asleep.  Often wakes up during the night and will take the second dose of Sonata so he can get a full night's rest.  Patient denies increased energy with decreased need for sleep, no increased talkativeness, no racing thoughts, no impulsivity or risky behaviors, no increased spending, no grandiosity, no increased irritability or anger, and no hallucinations.  Denies dizziness, syncope, seizures, numbness, tingling, tremor, tics, unsteady gait, slurred speech, confusion. Denies muscle or joint pain, stiffness, or dystonia.  Individual Medical History/ Review of Systems: Changes? :No    Past medications for mental health diagnoses include: Ambien, Belsomra, Remeron, Restoril, Serzone, Cymbalta, Trazodone,  Effexor XR, Sonata.   Allergies: Patient has no known allergies.  Current Medications:  Current Outpatient Medications:    alfuzosin (UROXATRAL) 10 MG 24 hr tablet, Take 10 mg by mouth daily., Disp: , Rfl:    atorvastatin (LIPITOR) 80 MG tablet, TAKE 1 TABLET BY MOUTH EVERY DAY, Disp: 90 tablet, Rfl: 1   Coenzyme Q10 (COQ-10) 400 MG CAPS, Take 400 mg by mouth daily. Reported on 10/28/2015, Disp: , Rfl:    esomeprazole (NEXIUM) 40 MG capsule, Take 1 capsule by mouth once daily, Disp: 90 capsule, Rfl: 0   lisinopril-hydrochlorothiazide (ZESTORETIC) 20-12.5 MG tablet, Take 1 tablet by mouth once daily, Disp: 90 tablet, Rfl: 0   morphine (MS CONTIN) 15 MG 12 hr tablet, Take 1 tablet (15 mg total) by mouth every 12 (twelve) hours. Fill when due. Not early., Disp: 60 tablet, Rfl: 0   amphetamine-dextroamphetamine (ADDERALL) 20 MG tablet, Take 1 tablet (20 mg total) by mouth 3 (three) times daily., Disp: 90 tablet, Rfl: 0   [START ON 11/09/2021] amphetamine-dextroamphetamine (ADDERALL) 20 MG tablet, Take 1 tablet (20 mg total) by mouth 3 (three) times daily., Disp: 90 tablet, Rfl: 0   [START ON 12/09/2021] amphetamine-dextroamphetamine (ADDERALL) 20 MG tablet, Take 1 tablet (20 mg total) by mouth 3 (three) times daily., Disp: 90 tablet, Rfl: 0   azelastine (ASTELIN) 0.1 % nasal spray, Place 1 spray into both nostrils 2 (two) times daily. Use in each nostril as directed (Patient not taking: Reported on  10/11/2021), Disp: 30 mL, Rfl: 12   cyclobenzaprine (FLEXERIL) 5 MG tablet, Take 1 tablet (5 mg total) by mouth 3 (three) times daily as needed for muscle spasms. (Patient not taking: Reported on 10/11/2021), Disp: 30 tablet, Rfl: 1   traZODone (DESYREL) 100 MG tablet, Take 0.5-2 tablets (50-200 mg total) by mouth at bedtime as needed for sleep., Disp: 180 tablet, Rfl: 1   zaleplon (SONATA) 10 MG capsule, Take 1 capsule by mouth at bedtime for sleep, may repeat 1 capsule prn for mid nocturnal awakening with 3  hours left to sleep, Disp: 60 capsule, Rfl: 5 Medication Side Effects: none  Family Medical/ Social History: Changes? No  MENTAL HEALTH EXAM:  There were no vitals taken for this visit.There is no height or weight on file to calculate BMI.  General Appearance: Casual, Neat and Well Groomed  Eye Contact:  Good  Speech:  Clear and Coherent and Normal Rate  Volume:  Normal  Mood:  Euthymic  Affect:  Appropriate  Thought Process:  Goal Directed and Descriptions of Associations: Circumstantial  Orientation:  Full (Time, Place, and Person)  Thought Content: Logical   Suicidal Thoughts:  No  Homicidal Thoughts:  No  Memory:  WNL  Judgement:  Good  Insight:  Good  Psychomotor Activity:  Normal  Concentration:  Concentration: Good and Attention Span: Good  Recall:  Good  Fund of Knowledge: Good  Language: Good  Assets:  Desire for Improvement Financial Resources/Insurance Housing Transportation  ADL's:  Intact  Cognition: WNL  Prognosis:  Good    DIAGNOSES:    ICD-10-CM   1. Attention deficit hyperactivity disorder (ADHD), combined type  F90.2     2. Depression, unspecified depression type  F32.A     3. Insomnia, unspecified type  G47.00       Receiving Psychotherapy: No    RECOMMENDATIONS:  PDMP was reviewed.  Last Adderall filled 09/08/2021.  Last Sonata filled 09/11/2021.  Morphine known to me. I provided 20 minutes of face to face time during this encounter, including time spent before and after the visit in records review, medical decision making, counseling pertinent to today's visit, and charting.  He is doing well so no changes will be made. Sleep hygiene discussed.  Continue Adderall 20 mg, 1 p.o. 3 times daily. Continue trazodone 100 mg, 1/2-2 p.o. nightly as needed sleep. Continue Sonata 10 mg, 1 p.o. nightly as needed.  May repeat 1 as needed for mid nocturnal awakening as long as he has 3 hours left to sleep. Return in 6 months.  Donnal Moat, PA-C

## 2021-10-16 ENCOUNTER — Other Ambulatory Visit: Payer: Self-pay | Admitting: Family Medicine

## 2021-10-17 MED ORDER — ESOMEPRAZOLE MAGNESIUM 40 MG PO CPDR: 40.0000 mg | DELAYED_RELEASE_CAPSULE | Freq: Every day | ORAL | 0 refills | Status: DC

## 2021-11-02 ENCOUNTER — Other Ambulatory Visit: Payer: Self-pay | Admitting: Family Medicine

## 2021-11-02 DIAGNOSIS — M25551 Pain in right hip: Secondary | ICD-10-CM

## 2021-11-02 DIAGNOSIS — G8929 Other chronic pain: Secondary | ICD-10-CM

## 2021-11-02 MED ORDER — MORPHINE SULFATE ER 15 MG PO TBCR: 15.0000 mg | EXTENDED_RELEASE_TABLET | Freq: Two times a day (BID) | ORAL | 0 refills | Status: DC

## 2021-11-02 NOTE — Telephone Encounter (Signed)
Requesting: morphine '15mg'$   Contract: 01/26/14 UDS: 08/23/21 Last Visit: 02/21/21 Next Visit: None Last Refill: 10/03/21 #60 and 0RF  Please Advise

## 2021-11-15 ENCOUNTER — Encounter: Payer: Federal, State, Local not specified - PPO | Admitting: Family Medicine

## 2021-11-27 ENCOUNTER — Other Ambulatory Visit: Payer: Self-pay | Admitting: Family Medicine

## 2021-11-27 DIAGNOSIS — I10 Essential (primary) hypertension: Secondary | ICD-10-CM

## 2021-12-01 ENCOUNTER — Ambulatory Visit (INDEPENDENT_AMBULATORY_CARE_PROVIDER_SITE_OTHER): Payer: Federal, State, Local not specified - PPO | Admitting: Family

## 2021-12-01 ENCOUNTER — Encounter: Payer: Self-pay | Admitting: Family

## 2021-12-01 VITALS — BP 138/84 | HR 55 | Temp 98.4°F | Resp 16 | Wt 164.0 lb

## 2021-12-01 DIAGNOSIS — F4321 Adjustment disorder with depressed mood: Secondary | ICD-10-CM

## 2021-12-01 DIAGNOSIS — E7439 Other disorders of intestinal carbohydrate absorption: Secondary | ICD-10-CM | POA: Diagnosis not present

## 2021-12-01 DIAGNOSIS — Z0000 Encounter for general adult medical examination without abnormal findings: Secondary | ICD-10-CM | POA: Diagnosis not present

## 2021-12-01 DIAGNOSIS — I10 Essential (primary) hypertension: Secondary | ICD-10-CM

## 2021-12-01 DIAGNOSIS — E739 Lactose intolerance, unspecified: Secondary | ICD-10-CM

## 2021-12-01 DIAGNOSIS — E782 Mixed hyperlipidemia: Secondary | ICD-10-CM

## 2021-12-01 LAB — LIPID PANEL
Cholesterol: 125 mg/dL (ref 0–200)
HDL: 53.6 mg/dL (ref >39.00)
LDL Cholesterol: 57 mg/dL (ref 0–99)
NonHDL: 70.94
Total CHOL/HDL Ratio: 2
Triglycerides: 68 mg/dL (ref 0.0–149.0)
VLDL: 13.6 mg/dL (ref 0.0–40.0)

## 2021-12-01 LAB — COMPREHENSIVE METABOLIC PANEL
ALT: 22 U/L (ref 0–53)
AST: 21 U/L (ref 0–37)
Albumin: 4.3 g/dL (ref 3.5–5.2)
Alkaline Phosphatase: 64 U/L (ref 39–117)
BUN: 27 mg/dL — ABNORMAL HIGH (ref 6–23)
CO2: 31 mEq/L (ref 19–32)
Calcium: 9.5 mg/dL (ref 8.4–10.5)
Chloride: 102 mEq/L (ref 96–112)
Creatinine, Ser: 1.06 mg/dL (ref 0.40–1.50)
GFR: 71.55 mL/min (ref >60.00)
Glucose, Bld: 113 mg/dL — ABNORMAL HIGH (ref 70–99)
Potassium: 4.5 mEq/L (ref 3.5–5.1)
Sodium: 139 mEq/L (ref 135–145)
Total Bilirubin: 0.9 mg/dL (ref 0.2–1.2)
Total Protein: 6.3 g/dL (ref 6.0–8.3)

## 2021-12-01 LAB — CBC WITH DIFFERENTIAL/PLATELET
Basophils Absolute: 0 10*3/uL (ref 0.0–0.1)
Basophils Relative: 0.3 % (ref 0.0–3.0)
Eosinophils Absolute: 0.1 10*3/uL (ref 0.0–0.7)
Eosinophils Relative: 1.2 % (ref 0.0–5.0)
HCT: 40.6 % (ref 39.0–52.0)
Hemoglobin: 13.8 g/dL (ref 13.0–17.0)
Lymphocytes Relative: 22.2 % (ref 12.0–46.0)
Lymphs Abs: 1 10*3/uL (ref 0.7–4.0)
MCHC: 33.9 g/dL (ref 30.0–36.0)
MCV: 94.4 fl (ref 78.0–100.0)
Monocytes Absolute: 0.4 10*3/uL (ref 0.1–1.0)
Monocytes Relative: 9.9 % (ref 3.0–12.0)
Neutro Abs: 2.9 10*3/uL (ref 1.4–7.7)
Neutrophils Relative %: 66.4 % (ref 43.0–77.0)
Platelets: 153 10*3/uL (ref 150.0–400.0)
RBC: 4.3 Mil/uL (ref 4.22–5.81)
RDW: 13.6 % (ref 11.5–15.5)
WBC: 4.3 10*3/uL (ref 4.0–10.5)

## 2021-12-01 LAB — HEMOGLOBIN A1C: Hgb A1c MFr Bld: 5.7 % (ref 4.6–6.5)

## 2021-12-01 LAB — TSH: TSH: 1.22 u[IU]/mL (ref 0.35–5.50)

## 2021-12-01 NOTE — Assessment & Plan Note (Addendum)
BP Readings from Last 3 Encounters:  12/01/21 (!) 154/82  08/05/21 137/85  08/05/20 122/70   BP mildly elevated today upon arrival but follow up bp was WNL. Continue current dose of zestoretic.

## 2021-12-01 NOTE — Assessment & Plan Note (Signed)
Due for A1C. Will check today.

## 2021-12-01 NOTE — Assessment & Plan Note (Signed)
Tolerating statin. Obtain follow up lipid panel.  Lab Results  Component Value Date   CHOL 125 08/05/2020   HDL 54.30 08/05/2020   LDLCALC 52 08/05/2020   LDLDIRECT 82.0 07/31/2014   TRIG 94.0 08/05/2020   CHOLHDL 2 08/05/2020

## 2021-12-01 NOTE — Progress Notes (Signed)
Subjective:     Patient ID: Gregory Carr, male    DOB: 1951-03-31, 71 y.o.   MRN: [id number]  Chief Complaint  Patient presents with   Annual Exam    HPI  Patient presents today for complete physical.  Immunizations: tetanus up to date Diet: needs improvement, eats a lot of frozen foods Exercise: enjoys running gym, weights Colonoscopy:  10/15/202 Vision: due Dental: up to date Health Maintenance Due  Topic Date Due   Zoster Vaccines- Shingrix (1 of 2) Never done   COVID-19 Vaccine (2 - Pfizer risk series) 08/26/2020   INFLUENZA VACCINE  11/22/2021    Past Medical History:  Diagnosis Date   Abdominal pain, left lower quadrant 01/17/2015   BACK PAIN, CHRONIC 11/11/2007   Qualifier: Diagnosis of  By: Redmond Pulling MD, Gwenyth Allegra ESOPHAGUS 11/11/2007   Qualifier: Diagnosis of  By: Redmond Pulling MD, [place]     BPH (benign prostatic hyperplasia) 09/13/2012   BPH (benign prostatic hyperplasia) 09/13/2012   Follows with [facility]    ERECTILE DYSFUNCTION 08/16/2006   Qualifier: Diagnosis of  By: Cletus Gash MD, [place]     GERD 08/16/2006   Qualifier: Diagnosis of  By: Cletus Gash MD, Leodis [place] SYNDROME 01/22/2008   Qualifier: Diagnosis of  By: Redmond Pulling MD, [place]     GLUCOSE INTOLERANCE 12/11/2007   Qualifier: Diagnosis of  By: Redmond Pulling MD, LauraLee      Grief reaction 11/07/2015   HTN (hypertension) 03/24/2011   HYPERLIPIDEMIA 08/16/2006   Qualifier: Diagnosis of  By: Cletus Gash MD, [place]     Onychomycosis 09/13/2012   Other anxiety states 04/08/2007   Qualifier: Diagnosis of  By: Cletus Gash MD, [place]     Preventative health care 09/29/2013   Renal cell carcinoma ([place]) 12/15/2011   S/p left nephrectomy Follows with [facility]    Right hip pain 07/21/2016    Past Surgical History:  Procedure Laterality Date   CERVICAL SPINE SURGERY     PARTIAL NEPHRECTOMY Left    around 2003    Family History  Problem Relation Age of Onset   Dementia Mother    Cancer  Daughter        brain cancer, glioblastoma   Hypertension Maternal Grandfather    Heart disease Maternal Grandfather        MI   Stroke Paternal Grandfather    Arthritis Sister     Social History   Socioeconomic History   Marital status: Widowed    Spouse name: Not on file   Number of children: Not on file   Years of education: Not on file   Highest education level: Not on file  Occupational History   Not on file  Tobacco Use   Smoking status: Former    Types: Cigarettes    Quit date: 08/23/1975    Years since quitting: 46.3   Smokeless tobacco: Never  Substance and Sexual Activity   Alcohol use: Yes    Alcohol/week: 1.0 - 2.0 standard drink of alcohol    Types: 1 - 2 Cans of beer per week   Drug use: No   Sexual activity: Yes    Comment: lives with wife, no dietary restrictions, regular exercise  Other Topics Concern   Not on file  Social History Narrative   Jewish   Lives alone (has a turtle and a bird)   Retired- Market researcher for [employer]   Enjoys running, gym   Social Determinants of  Health   Financial Resource Strain: Not on file  Food Insecurity: Not on file  Transportation Needs: Not on file  Physical Activity: Not on file  Stress: Not on file  Social Connections: Not on file  Intimate Partner Violence: Not on file    Outpatient Medications Prior to Visit  Medication Sig Dispense Refill   alfuzosin (UROXATRAL) 10 MG 24 hr tablet Take 10 mg by mouth daily.     amphetamine-dextroamphetamine (ADDERALL) 20 MG tablet Take 1 tablet (20 mg total) by mouth 3 (three) times daily. 90 tablet 0   amphetamine-dextroamphetamine (ADDERALL) 20 MG tablet Take 1 tablet (20 mg total) by mouth 3 (three) times daily. 90 tablet 0   [START ON 12/09/2021] amphetamine-dextroamphetamine (ADDERALL) 20 MG tablet Take 1 tablet (20 mg total) by mouth 3 (three) times daily. 90 tablet 0   atorvastatin (LIPITOR) 80 MG tablet TAKE 1 TABLET BY MOUTH EVERY DAY 90  tablet 1   azelastine (ASTELIN) 0.1 % nasal spray Place 1 spray into both nostrils 2 (two) times daily. Use in each nostril as directed 30 mL 12   Coenzyme Q10 (COQ-10) 400 MG CAPS Take 400 mg by mouth daily. Reported on 10/28/2015     cyclobenzaprine (FLEXERIL) 5 MG tablet Take 1 tablet (5 mg total) by mouth 3 (three) times daily as needed for muscle spasms. 30 tablet 1   esomeprazole (NEXIUM) 40 MG capsule Take 1 capsule (40 mg total) by mouth daily. 90 capsule 0   lisinopril-hydrochlorothiazide (ZESTORETIC) 20-12.5 MG tablet Take 1 tablet by mouth once daily 90 tablet 0   morphine (MS CONTIN) 15 MG 12 hr tablet Take 1 tablet (15 mg total) by mouth every 12 (twelve) hours. Fill when due. Not early. 60 tablet 0   traZODone (DESYREL) 100 MG tablet Take 0.5-2 tablets (50-200 mg total) by mouth at bedtime as needed for sleep. 180 tablet 1   zaleplon (SONATA) 10 MG capsule Take 1 capsule by mouth at bedtime for sleep, may repeat 1 capsule prn for mid nocturnal awakening with 3 hours left to sleep 60 capsule 5   No facility-administered medications prior to visit.    No Known Allergies  Review of Systems  Constitutional:  Negative for weight loss.  HENT:  Negative for congestion.   Eyes:  Negative for discharge.  Respiratory:  Negative for cough.   Gastrointestinal:  Negative for constipation and diarrhea.  Genitourinary:        Some bph symptoms being managed by urology  Musculoskeletal:  Positive for back pain (chronic). Negative for joint pain and myalgias.  Skin:  Negative for rash.  Neurological:  Negative for headaches.  Psychiatric/Behavioral:         Stable mood       Objective:    Physical Exam  BP 138/84 (BP Location: Right Arm, Patient Position: Sitting, Cuff Size: Large)   Pulse (!) 55   Temp 98.4 F (36.9 C) (Oral)   Resp 16   Wt 164 lb (74.4 kg)   SpO2 98%   BMI 26.47 kg/m  Wt Readings from Last 3 Encounters:  12/01/21 164 lb (74.4 kg)  08/05/21 166 lb 9.6 oz  (75.6 kg)  02/23/21 160 lb (72.6 kg)   Physical Exam  Constitutional: He is oriented to person, place, and time. He appears well-developed and well-nourished. No distress.  HENT:  Head: Normocephalic and atraumatic.  Right Ear: Tympanic membrane and ear canal normal.  Left Ear: Tympanic membrane and ear canal normal.  Mouth/Throat:  Oropharynx is clear and moist.  Eyes: Pupils are equal, round, and reactive to light. No scleral icterus.  Neck: Normal range of motion. No thyromegaly present.  Cardiovascular: Normal rate and regular rhythm.   No murmur heard. Pulmonary/Chest: Effort normal and breath sounds normal. No respiratory distress. He has no wheezes. He has no rales. He exhibits no tenderness.  Abdominal: Soft. Bowel sounds are normal. He exhibits no distension and no mass. There is no tenderness. There is no rebound and no guarding.  Musculoskeletal: He exhibits no edema.  Lymphadenopathy:    He has no cervical adenopathy.  Neurological: He is alert and oriented to person, place, and time. He has normal patellar reflexes. He exhibits normal muscle tone. Coordination normal.  Skin: Skin is warm and dry.  Psychiatric: He has a normal mood and affect. His behavior is normal. Judgment and thought content normal.           Assessment & Plan:       Assessment & Plan:   Problem List Items Addressed This Visit       Unprioritized   Preventative health care - Primary    He believes that he had one hep b vaccine and 1 Shingrix vaccine at his pharmacy. He will check back with them to complete the series. Colo up to date. PSA being monitored by Urology. We discussed trying to work more fresh produce into his diet. Continue regular exercise.       Hyperlipidemia    Tolerating statin. Obtain follow up lipid panel.  Lab Results  Component Value Date   CHOL 125 08/05/2020   HDL 54.30 08/05/2020   LDLCALC 52 08/05/2020   LDLDIRECT 82.0 07/31/2014   TRIG 94.0 08/05/2020    CHOLHDL 2 08/05/2020         Grief reaction    Recently lost his wife. Notes that it is very lonely.  He does have 2 daughters who live locally. We discussed referral for grief counseling. He declines at this time but will let us know if he changes his mind.       GLUCOSE INTOLERANCE    Due for A1C. Will check today.       Glucose intolerance   Essential hypertension    BP Readings from Last 3 Encounters:  12/01/21 (!) 154/82  08/05/21 137/85  08/05/20 122/70  BP mildly elevated today upon arrival but follow up bp was WNL. Continue current dose of zestoretic.       I am having Noah [name]. Okray "[name]" maintain his CoQ-10, alfuzosin, azelastine, atorvastatin, cyclobenzaprine, amphetamine-dextroamphetamine, zaleplon, traZODone, amphetamine-dextroamphetamine, amphetamine-dextroamphetamine, esomeprazole, morphine, and lisinopril-hydrochlorothiazide.  No orders of the defined types were placed in this encounter.

## 2021-12-01 NOTE — Assessment & Plan Note (Signed)
He believes that he had one hep b vaccine and 1 Shingrix vaccine at his pharmacy. He will check back with them to complete the series. Colo up to date. PSA being monitored by Urology. We discussed trying to work more fresh produce into his diet. Continue regular exercise.

## 2021-12-01 NOTE — Assessment & Plan Note (Signed)
Recently lost his wife. Notes that it is very lonely.  He does have 2 daughters who live locally. We discussed referral for grief counseling. He declines at this time but will let us know if he changes his mind.

## 2021-12-02 ENCOUNTER — Other Ambulatory Visit: Payer: Self-pay | Admitting: Family Medicine

## 2021-12-02 DIAGNOSIS — M25551 Pain in right hip: Secondary | ICD-10-CM

## 2021-12-02 DIAGNOSIS — G8929 Other chronic pain: Secondary | ICD-10-CM

## 2021-12-02 DIAGNOSIS — M545 Low back pain, unspecified: Secondary | ICD-10-CM

## 2021-12-05 MED ORDER — MORPHINE SULFATE ER 15 MG PO TBCR: 15.0000 mg | EXTENDED_RELEASE_TABLET | Freq: Two times a day (BID) | ORAL | 0 refills | Status: DC

## 2021-12-05 NOTE — Telephone Encounter (Signed)
Requesting: MS Contin Contract:  UDS: 08/23/21 Last Visit: 12/01/21 Next Visit: none Last Refill: 11/02/21  Please Advise

## 2022-01-04 ENCOUNTER — Other Ambulatory Visit: Payer: Self-pay | Admitting: Family Medicine

## 2022-01-04 DIAGNOSIS — M25551 Pain in right hip: Secondary | ICD-10-CM

## 2022-01-04 DIAGNOSIS — G8929 Other chronic pain: Secondary | ICD-10-CM

## 2022-01-04 MED ORDER — MORPHINE SULFATE ER 15 MG PO TBCR: 15.0000 mg | EXTENDED_RELEASE_TABLET | Freq: Two times a day (BID) | ORAL | 0 refills | Status: DC

## 2022-01-04 NOTE — Telephone Encounter (Signed)
Requesting: morphine '15mg'$   Contract:01/26/2014 UDS:08/23/21 Last Visit: 12/01/21 w/ Lenna Sciara Next Visit: None  Last Refill: 12/05/21 #60 and 0RF  Please Advise

## 2022-01-05 ENCOUNTER — Other Ambulatory Visit: Payer: Self-pay | Admitting: Family Medicine

## 2022-01-05 ENCOUNTER — Telehealth: Payer: Self-pay

## 2022-01-05 MED ORDER — NALOXONE HCL 4 MG/0.1ML NA LIQD: NASAL | 1 refills | Status: DC

## 2022-01-05 NOTE — Telephone Encounter (Signed)
Called pt to see if he had any question regarding Rx sent .

## 2022-01-05 NOTE — Telephone Encounter (Signed)
Pharmacist from [facility] called regarding Pt's morphine and trazodone and Zaleplon medication- it puts Pt at high risk for respiratory depression. I reviewed the chart- seems that Pt has been taking this combination for awhile- I gave the okay to fill the morphine.

## 2022-01-16 ENCOUNTER — Other Ambulatory Visit: Payer: Self-pay | Admitting: Family Medicine

## 2022-01-16 ENCOUNTER — Telehealth: Payer: Self-pay | Admitting: Physician Assistant

## 2022-01-16 ENCOUNTER — Other Ambulatory Visit: Payer: Self-pay

## 2022-01-16 MED ORDER — AMPHETAMINE-DEXTROAMPHETAMINE 20 MG PO TABS: 20.0000 mg | ORAL_TABLET | Freq: Three times a day (TID) | ORAL | 0 refills | Status: DC

## 2022-01-16 NOTE — Telephone Encounter (Signed)
Pt called and asked for a refill on his adderall 20 mg. The pharmacy is [facility] on [address] in [place]

## 2022-01-16 NOTE — Telephone Encounter (Signed)
Pended.

## 2022-02-02 ENCOUNTER — Other Ambulatory Visit: Payer: Self-pay | Admitting: Family Medicine

## 2022-02-02 DIAGNOSIS — M25551 Pain in right hip: Secondary | ICD-10-CM

## 2022-02-02 DIAGNOSIS — M545 Low back pain, unspecified: Secondary | ICD-10-CM

## 2022-02-02 DIAGNOSIS — G8929 Other chronic pain: Secondary | ICD-10-CM

## 2022-02-03 MED ORDER — MORPHINE SULFATE ER 15 MG PO TBCR: 15.0000 mg | EXTENDED_RELEASE_TABLET | Freq: Two times a day (BID) | ORAL | 0 refills | Status: DC

## 2022-02-03 NOTE — Telephone Encounter (Signed)
Requesting: ms contin Contract:  UDS: 08/23/21 Last Visit: 12/01/21 Next Visit: none Last Refill: 01/04/22  Please Advise

## 2022-02-27 ENCOUNTER — Other Ambulatory Visit: Payer: Self-pay | Admitting: Family Medicine

## 2022-02-27 DIAGNOSIS — I10 Essential (primary) hypertension: Secondary | ICD-10-CM

## 2022-03-06 ENCOUNTER — Other Ambulatory Visit: Payer: Self-pay | Admitting: Family Medicine

## 2022-03-06 DIAGNOSIS — G8929 Other chronic pain: Secondary | ICD-10-CM

## 2022-03-06 DIAGNOSIS — M25551 Pain in right hip: Secondary | ICD-10-CM

## 2022-03-07 MED ORDER — MORPHINE SULFATE ER 15 MG PO TBCR: 15.0000 mg | EXTENDED_RELEASE_TABLET | Freq: Two times a day (BID) | ORAL | 0 refills | Status: DC

## 2022-03-07 NOTE — Telephone Encounter (Signed)
Requesting: morphine '15mg'$   Contract: 01/26/2014 UDS: 08/23/21 Last Visit: 12/01/21 Next Visit: None Last Refill: 02/03/22 #60 and 0RF   Please Advise

## 2022-04-04 ENCOUNTER — Other Ambulatory Visit: Payer: Self-pay | Admitting: Family Medicine

## 2022-04-04 DIAGNOSIS — M25551 Pain in right hip: Secondary | ICD-10-CM

## 2022-04-04 DIAGNOSIS — G8929 Other chronic pain: Secondary | ICD-10-CM

## 2022-04-04 MED ORDER — MORPHINE SULFATE ER 15 MG PO TBCR: 15.0000 mg | EXTENDED_RELEASE_TABLET | Freq: Two times a day (BID) | ORAL | 0 refills | Status: DC

## 2022-04-04 NOTE — Telephone Encounter (Signed)
Requesting: morphine '15mg'$   Contract: None UDS: 08/23/21 Last Visit: 12/01/21 Next Visit: None Last Refill: 03/07/22 #60 and 0RF   Please Advise

## 2022-04-12 ENCOUNTER — Other Ambulatory Visit: Payer: Self-pay | Admitting: Family Medicine

## 2022-04-12 MED ORDER — ESOMEPRAZOLE MAGNESIUM 40 MG PO CPDR: 40.0000 mg | DELAYED_RELEASE_CAPSULE | Freq: Every day | ORAL | 0 refills | Status: DC

## 2022-04-13 ENCOUNTER — Ambulatory Visit (INDEPENDENT_AMBULATORY_CARE_PROVIDER_SITE_OTHER): Payer: Federal, State, Local not specified - PPO | Admitting: Physician Assistant

## 2022-04-13 ENCOUNTER — Encounter: Payer: Self-pay | Admitting: Physician Assistant

## 2022-04-13 DIAGNOSIS — G4700 Insomnia, unspecified: Secondary | ICD-10-CM

## 2022-04-13 DIAGNOSIS — F3342 Major depressive disorder, recurrent, in full remission: Secondary | ICD-10-CM | POA: Diagnosis not present

## 2022-04-13 DIAGNOSIS — F902 Attention-deficit hyperactivity disorder, combined type: Secondary | ICD-10-CM

## 2022-04-13 MED ORDER — AMPHETAMINE-DEXTROAMPHETAMINE 20 MG PO TABS: 20.0000 mg | ORAL_TABLET | Freq: Three times a day (TID) | ORAL | 0 refills | Status: DC

## 2022-04-13 MED ORDER — TRAZODONE HCL 100 MG PO TABS: 50.0000 mg | ORAL_TABLET | Freq: Every evening | ORAL | 1 refills | Status: DC | PRN

## 2022-04-13 MED ORDER — ZALEPLON 10 MG PO CAPS: ORAL_CAPSULE | ORAL | 5 refills | Status: DC

## 2022-04-13 NOTE — Progress Notes (Signed)
[facility]  Patient ID: Gregory Carr,  MRN: [id number]  PCP: Mosie Lukes, MD  Date of Evaluation: 04/13/2022 Time spent:20 minutes  Chief Complaint:  Chief Complaint   Depression; Insomnia; Follow-up    HISTORY/CURRENT STATUS: HPI routine med check.  Doing well. Meds are still working. States that attention is good without easy distractibility.  Able to focus on things and finish tasks to completion.   Patient is able to enjoy things.  Energy and motivation are good.  Is retired.  No extreme sadness, tearfulness, or feelings of hopelessness.  Sleeps well, needs the Horatio or he can't fall asleep. Feels rested when he gets up.  ADLs and personal hygiene are normal.   Denies any changes in remembering things.  Appetite has not changed.  Weight is stable.  No anxiety to speak of. Denies suicidal or homicidal thoughts.  Patient denies increased energy with decreased need for sleep, increased talkativeness, racing thoughts, impulsivity or risky behaviors, increased spending, increased libido, grandiosity, increased irritability or anger, paranoia, or hallucinations.  Denies dizziness, syncope, seizures, numbness, tingling, tremor, tics, unsteady gait, slurred speech, confusion. Denies muscle or joint pain, stiffness, or dystonia.  Individual Medical History/ Review of Systems: Changes? :No    Past medications for mental health diagnoses include: Ambien, Belsomra, Remeron, Restoril, Serzone, Cymbalta, Trazodone, Effexor XR, Sonata.   Allergies: Patient has no known allergies.  Current Medications:  Current Outpatient Medications:    atorvastatin (LIPITOR) 80 MG tablet, TAKE 1 TABLET BY MOUTH EVERY DAY, Disp: 90 tablet, Rfl: 1   Coenzyme Q10 (COQ-10) 400 MG CAPS, Take 400 mg by mouth daily. Reported on 10/28/2015, Disp: , Rfl:    esomeprazole (NEXIUM) 40 MG capsule, Take 1 capsule (40 mg total) by mouth daily., Disp: 90 capsule, Rfl: 0    lisinopril-hydrochlorothiazide (ZESTORETIC) 20-12.5 MG tablet, Take 1 tablet by mouth daily., Disp: 90 tablet, Rfl: 0   morphine (MS CONTIN) 15 MG 12 hr tablet, Take 1 tablet (15 mg total) by mouth every 12 (twelve) hours. Fill when due. Not early., Disp: 60 tablet, Rfl: 0   alfuzosin (UROXATRAL) 10 MG 24 hr tablet, Take 10 mg by mouth daily. (Patient not taking: Reported on 04/13/2022), Disp: , Rfl:    amphetamine-dextroamphetamine (ADDERALL) 20 MG tablet, Take 1 tablet (20 mg total) by mouth 3 (three) times daily., Disp: 90 tablet, Rfl: 0   [START ON 05/20/2022] amphetamine-dextroamphetamine (ADDERALL) 20 MG tablet, Take 1 tablet (20 mg total) by mouth 3 (three) times daily., Disp: 90 tablet, Rfl: 0   [START ON 06/19/2022] amphetamine-dextroamphetamine (ADDERALL) 20 MG tablet, Take 1 tablet (20 mg total) by mouth 3 (three) times daily., Disp: 90 tablet, Rfl: 0   azelastine (ASTELIN) 0.1 % nasal spray, Place 1 spray into both nostrils 2 (two) times daily. Use in each nostril as directed (Patient not taking: Reported on 04/13/2022), Disp: 30 mL, Rfl: 12   cyclobenzaprine (FLEXERIL) 5 MG tablet, Take 1 tablet (5 mg total) by mouth 3 (three) times daily as needed for muscle spasms. (Patient not taking: Reported on 04/13/2022), Disp: 30 tablet, Rfl: 1   naloxone (NARCAN) nasal spray 4 mg/0.1 mL, 1 spray in 1 nostril prn respiratory suppression, may repeat every 2-3 minutes til responsive, alternating nostrils until EMS arrives (Patient not taking: Reported on 04/13/2022), Disp: 2 each, Rfl: 1   traZODone (DESYREL) 100 MG tablet, Take 0.5-2 tablets (50-200 mg total) by mouth at bedtime as needed for sleep., Disp: 180 tablet, Rfl: 1  zaleplon (SONATA) 10 MG capsule, Take 1 capsule by mouth at bedtime for sleep, may repeat 1 capsule prn for mid nocturnal awakening with 3 hours left to sleep, Disp: 60 capsule, Rfl: 5 Medication Side Effects: none  Family Medical/ Social History: Changes? No  MENTAL HEALTH  EXAM:  There were no vitals taken for this visit.There is no height or weight on file to calculate BMI.  General Appearance: Casual, Neat and Well Groomed  Eye Contact:  Good  Speech:  Clear and Coherent and Normal Rate  Volume:  Normal  Mood:  Euthymic  Affect:  Appropriate  Thought Process:  Goal Directed and Descriptions of Associations: Circumstantial  Orientation:  Full (Time, Place, and Person)  Thought Content: Logical   Suicidal Thoughts:  No  Homicidal Thoughts:  No  Memory:  WNL  Judgement:  Good  Insight:  Good  Psychomotor Activity:  Normal  Concentration:  Concentration: Good and Attention Span: Good  Recall:  Good  Fund of Knowledge: Good  Language: Good  Assets:  Desire for Improvement  ADL's:  Intact  Cognition: WNL  Prognosis:  Good   DIAGNOSES:    ICD-10-CM   1. Attention deficit hyperactivity disorder (ADHD), combined type  F90.2     2. Insomnia, unspecified type  G47.00     3. 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  Last Adderall filled 03/22/2022.Marland Kitchen  Last Sonata filled 03/20/2022.  Morphine known to me. I provided 20 minutes of face to face time during this encounter, including time spent before and after the visit in records review, medical decision making, counseling pertinent to today's visit, and charting.   He's doing well so no changes need to be made. Sleep hygiene discussed.   Continue Adderall 20 mg, 1 p.o. 3 times daily. Continue trazodone 100 mg, 1/2-2 p.o. nightly as needed sleep. Continue Sonata 10 mg, 1 p.o. nightly as needed.  May repeat 1 as needed for mid nocturnal awakening as long as he has 3 hours left to sleep. Return in 6 months.  Donnal Moat, PA-C

## 2022-04-19 ENCOUNTER — Telehealth: Payer: Self-pay | Admitting: Family Medicine

## 2022-04-19 NOTE — Telephone Encounter (Signed)
Patient dropped off document to be filled out by provider [employer]. Patient requested to send it via Call Patient to pick up within 7-days. Document is located in providers tray at front office. 11 pages Pt tel [phone].

## 2022-04-19 NOTE — Telephone Encounter (Signed)
Please advised if ok for pt to see another provider or will need to see Dr.Blyth for paperwork for [place] pt was advised will need to spoke with provider and management to  See if another provider will be able to help due Dr.Blyth not been in office and not having appointments. Pt was advised will not have paperwork ready in 7 days and would call pt back with update on Friday.

## 2022-04-21 ENCOUNTER — Telehealth: Payer: Self-pay

## 2022-04-21 NOTE — Telephone Encounter (Signed)
Called pt appt made with [name], Np  Per Maudie Mercury for [facility] paperwork

## 2022-04-26 ENCOUNTER — Ambulatory Visit: Payer: Federal, State, Local not specified - PPO | Admitting: Family

## 2022-04-26 VITALS — BP 165/90 | HR 80 | Temp 98.4°F | Resp 16 | Wt 166.0 lb

## 2022-04-26 DIAGNOSIS — I10 Essential (primary) hypertension: Secondary | ICD-10-CM

## 2022-04-26 MED ORDER — LISINOPRIL-HYDROCHLOROTHIAZIDE 20-12.5 MG PO TABS: 1.5000 | ORAL_TABLET | Freq: Every day | ORAL | 0 refills | Status: DC

## 2022-04-26 NOTE — Assessment & Plan Note (Addendum)
BP Readings from Last 3 Encounters:  04/26/22 (!) 170/81  12/01/21 138/84  08/05/21 137/85   BP high today. Will increase his lisinopril hctz 20-12.5 to 1.5 tabs once daily. Follow up in 1 week. Advised pt will hold off on completing form until we see updated blood pressure next week which should be improved.
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
## 2022-05-02 ENCOUNTER — Ambulatory Visit: Payer: Federal, State, Local not specified - PPO | Admitting: Family

## 2022-05-03 ENCOUNTER — Ambulatory Visit: Payer: Federal, State, Local not specified - PPO | Admitting: Family

## 2022-05-03 VITALS — BP 133/75 | HR 76 | Temp 97.8°F | Resp 16 | Wt 166.0 lb

## 2022-05-03 DIAGNOSIS — I10 Essential (primary) hypertension: Secondary | ICD-10-CM | POA: Diagnosis not present
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
## 2022-05-03 NOTE — Assessment & Plan Note (Signed)
Follow up bp here in the office is improved but still above goal. His home reading prior to coming in was normal however, so I think that there is a white coat component to his hypertension. Will continue zestoretic 1.5 tabs once daily and obtain a follow up bmet.

## 2022-05-04 ENCOUNTER — Other Ambulatory Visit: Payer: Federal, State, Local not specified - PPO

## 2022-05-04 ENCOUNTER — Other Ambulatory Visit (INDEPENDENT_AMBULATORY_CARE_PROVIDER_SITE_OTHER): Payer: Federal, State, Local not specified - PPO

## 2022-05-04 DIAGNOSIS — I10 Essential (primary) hypertension: Secondary | ICD-10-CM | POA: Diagnosis not present

## 2022-05-05 ENCOUNTER — Other Ambulatory Visit: Payer: Self-pay | Admitting: Family Medicine

## 2022-05-05 DIAGNOSIS — M25551 Pain in right hip: Secondary | ICD-10-CM

## 2022-05-05 DIAGNOSIS — G8929 Other chronic pain: Secondary | ICD-10-CM

## 2022-05-05 LAB — BASIC METABOLIC PANEL
BUN: 26 mg/dL — ABNORMAL HIGH (ref 6–23)
CO2: 29 mEq/L (ref 19–32)
Calcium: 9.1 mg/dL (ref 8.4–10.5)
Chloride: 103 mEq/L (ref 96–112)
Creatinine, Ser: 1.09 mg/dL (ref 0.40–1.50)
GFR: 68.99 mL/min (ref >60.00)
Glucose, Bld: 104 mg/dL — ABNORMAL HIGH (ref 70–99)
Potassium: 3.9 mEq/L (ref 3.5–5.1)
Sodium: 140 mEq/L (ref 135–145)

## 2022-05-05 MED ORDER — MORPHINE SULFATE ER 15 MG PO TBCR: 15.0000 mg | EXTENDED_RELEASE_TABLET | Freq: Two times a day (BID) | ORAL | 0 refills | Status: DC

## 2022-05-05 NOTE — Telephone Encounter (Signed)
Requesting: morphine '15mg'$   Contract: 01/26/2014 UDS: 08/23/21 Last Visit: 05/03/22 w/ Lenna Sciara Next Visit: 08/01/22 Last Refill: 04/04/22 #60 and 0RF   Please Advise

## 2022-05-28 ENCOUNTER — Other Ambulatory Visit: Payer: Self-pay | Admitting: Family Medicine

## 2022-05-28 DIAGNOSIS — I10 Essential (primary) hypertension: Secondary | ICD-10-CM

## 2022-06-03 ENCOUNTER — Other Ambulatory Visit: Payer: Self-pay | Admitting: Family Medicine

## 2022-06-03 DIAGNOSIS — M25551 Pain in right hip: Secondary | ICD-10-CM

## 2022-06-03 DIAGNOSIS — G8929 Other chronic pain: Secondary | ICD-10-CM

## 2022-06-05 MED ORDER — MORPHINE SULFATE ER 15 MG PO TBCR: 15.0000 mg | EXTENDED_RELEASE_TABLET | Freq: Two times a day (BID) | ORAL | 0 refills | Status: DC

## 2022-06-05 NOTE — Telephone Encounter (Signed)
Requesting: morphine 61m Contract: None UDS: 08/23/21 Last Visit: 05/03/22 w/ MLenna SciaraNext Visit: 08/01/22 Last Refill: 05/05/22 #60 and 0RF   Please Advise

## 2022-06-08 ENCOUNTER — Telehealth: Payer: Self-pay

## 2022-06-08 NOTE — Telephone Encounter (Signed)
Prior Authorization Amphetamine-Dextroamphetamine 20MG tablets #90/30d [employer]   Approved Effective:  05/09/2022 to 06/08/2023

## 2022-06-08 NOTE — Telephone Encounter (Signed)
Prior Authorization Amphetamine-Dextroamphetamine 20MG tablets #90/30d [employer]

## 2022-07-05 ENCOUNTER — Other Ambulatory Visit: Payer: Self-pay | Admitting: Family Medicine

## 2022-07-05 DIAGNOSIS — G8929 Other chronic pain: Secondary | ICD-10-CM

## 2022-07-05 DIAGNOSIS — M545 Low back pain, unspecified: Secondary | ICD-10-CM

## 2022-07-05 DIAGNOSIS — M25551 Pain in right hip: Secondary | ICD-10-CM

## 2022-07-05 MED ORDER — MORPHINE SULFATE ER 15 MG PO TBCR: 15.0000 mg | EXTENDED_RELEASE_TABLET | Freq: Two times a day (BID) | ORAL | 0 refills | Status: DC

## 2022-07-05 NOTE — Telephone Encounter (Signed)
Requesting: morphine '15mg'$   Contract:01/26/2014 UDS:08/23/21 Last Visit: 05/03/22 w/ Lenna Sciara Next Visit: 08/01/22 Last Refill: 06/05/22 #60 and 0RF   Please Advise

## 2022-07-07 ENCOUNTER — Other Ambulatory Visit: Payer: Self-pay | Admitting: Family Medicine

## 2022-07-31 ENCOUNTER — Telehealth: Payer: Self-pay | Admitting: Physician Assistant

## 2022-07-31 ENCOUNTER — Other Ambulatory Visit: Payer: Self-pay

## 2022-07-31 MED ORDER — AMPHETAMINE-DEXTROAMPHETAMINE 20 MG PO TABS: 20.0000 mg | ORAL_TABLET | Freq: Three times a day (TID) | ORAL | 0 refills | Status: DC

## 2022-07-31 NOTE — Assessment & Plan Note (Signed)
Receives meds from psychiatry

## 2022-07-31 NOTE — Assessment & Plan Note (Signed)
Tolerating statin, encouraged heart healthy diet, avoid trans fats, minimize simple carbs and saturated fats. Increase exercise as tolerated 

## 2022-07-31 NOTE — Telephone Encounter (Signed)
Patient has RF available at [facility] for Zaleplon. Will pend Adderall.

## 2022-07-31 NOTE — Telephone Encounter (Signed)
Patient called in for refill on Adderall 20mg  and Zaleplon 10mg . States that he out and would prescriptions sent in. Ph: [phone] Appt 6/21 He needs the Adderall to go to [facility] [address] [facility], [place] and the Zaleplon to be sent to [facility]

## 2022-07-31 NOTE — Assessment & Plan Note (Signed)
Well controlled, no changes to meds. Encouraged heart healthy diet such as the DASH diet and exercise as tolerated.  °

## 2022-07-31 NOTE — Telephone Encounter (Signed)
Pended.

## 2022-08-01 ENCOUNTER — Ambulatory Visit: Payer: Federal, State, Local not specified - PPO | Admitting: Family Medicine

## 2022-08-01 VITALS — BP 142/82 | HR 71 | Temp 98.0°F | Resp 16 | Ht 66.0 in | Wt 164.0 lb

## 2022-08-01 DIAGNOSIS — M549 Dorsalgia, unspecified: Secondary | ICD-10-CM

## 2022-08-01 DIAGNOSIS — E782 Mixed hyperlipidemia: Secondary | ICD-10-CM

## 2022-08-01 DIAGNOSIS — M545 Low back pain, unspecified: Secondary | ICD-10-CM

## 2022-08-01 DIAGNOSIS — M25511 Pain in right shoulder: Secondary | ICD-10-CM | POA: Diagnosis not present

## 2022-08-01 DIAGNOSIS — M25512 Pain in left shoulder: Secondary | ICD-10-CM

## 2022-08-01 DIAGNOSIS — F902 Attention-deficit hyperactivity disorder, combined type: Secondary | ICD-10-CM

## 2022-08-01 DIAGNOSIS — E7439 Other disorders of intestinal carbohydrate absorption: Secondary | ICD-10-CM

## 2022-08-01 DIAGNOSIS — I10 Essential (primary) hypertension: Secondary | ICD-10-CM

## 2022-08-01 DIAGNOSIS — G8929 Other chronic pain: Secondary | ICD-10-CM

## 2022-08-01 NOTE — Progress Notes (Signed)
Subjective:   By signing my name below, I, Vickey Sages, attest that this documentation has been prepared under the direction and in the presence of Bradd Canary, MD., 08/01/2022.   Patient ID: Gregory Carr, male    DOB: 1950/11/27, 72 y.o.   MRN: [id number]  No chief complaint on file.  HPI Patient is in today for an office visit.  Recurrent Diarrhea Patient reports that he is experiencing recurrent diarrhea which he says has been occurring for several months. He denies blood in stool/nausea/vomiting. Additionally, there is a hernia above the naval which he says is tender upon touch.  Upper/Thoracic Back Pain Patient complains of worsening upper and thoracic back pain that is radiating to his neck and shoulders. He states that the pain is worse in the joints rather than the muscle. He has been dealing with this for many years and is interested in managing this with an orthopedist. Additionally, he reports that he is diagnosed with degenerative disc disease. He denies CP/palpitations/SOB/ HA/fever/chills/headache.  Essential Hypertension Patient's blood pressure and pulse are regular today. He currently takes Lisinopril-Hydrochlorothiazide 20-12.5 mg once daily which has been tolerable and reports that his at-home measurements are typically around 132-84. BP Readings from Last 3 Encounters:  08/01/22 (!) 142/82  05/03/22 133/75  04/26/22 (!) 165/90   Pulse Readings from Last 3 Encounters:  08/01/22 71  05/03/22 76  04/26/22 80   Family History Patient's 79 yo daughter has a glioblastoma that has been present for 16 years.  Immunizations Patient is interested in receiving RSV and Shingles vaccinations at the pharmacy.  Past Medical History:  Diagnosis Date   Abdominal pain, left lower quadrant 01/17/2015   BACK PAIN, CHRONIC 11/11/2007   Qualifier: Diagnosis of  By: Andrey Campanile MD, [place] Bilberry ESOPHAGUS 11/11/2007   Qualifier: Diagnosis of  By: Andrey Campanile MD, LauraLee      BPH (benign prostatic hyperplasia) 09/13/2012   BPH (benign prostatic hyperplasia) 09/13/2012   Follows with [facility]    ERECTILE DYSFUNCTION 08/16/2006   Qualifier: Diagnosis of  By: Blossom Hoops MD, Luis     GERD 08/16/2006   Qualifier: Diagnosis of  By: Blossom Hoops MD, Jiles Crocker SYNDROME 01/22/2008   Qualifier: Diagnosis of  By: Andrey Campanile MD, LauraLee     GLUCOSE INTOLERANCE 12/11/2007   Qualifier: Diagnosis of  By: Andrey Campanile MD, LauraLee      Grief reaction 11/07/2015   HTN (hypertension) 03/24/2011   HYPERLIPIDEMIA 08/16/2006   Qualifier: Diagnosis of  By: Blossom Hoops MD, Luis     Onychomycosis 09/13/2012   Other anxiety states 04/08/2007   Qualifier: Diagnosis of  By: Blossom Hoops MD, Luis     Preventative health care 09/29/2013   Renal cell carcinoma (HCC) 12/15/2011   S/p left nephrectomy Follows with [facility]    Right hip pain 07/21/2016    Past Surgical History:  Procedure Laterality Date   CERVICAL SPINE SURGERY     PARTIAL NEPHRECTOMY Left    around 2003    Family History  Problem Relation Age of Onset   Dementia Mother    Cancer Daughter        brain cancer, glioblastoma   Hypertension Maternal Grandfather    Heart disease Maternal Grandfather        MI   Stroke Paternal Grandfather    Arthritis Sister     Social History   Socioeconomic History   Marital status: Widowed    Spouse name: Not on  file   Number of children: Not on file   Years of education: Not on file   Highest education level: Not on file  Occupational History   Not on file  Tobacco Use   Smoking status: Former    Types: Cigarettes    Quit date: 08/23/1975    Years since quitting: 46.9   Smokeless tobacco: Never  Substance and Sexual Activity   Alcohol use: Yes    Alcohol/week: 1.0 - 2.0 standard drink of alcohol    Types: 1 - 2 Cans of beer per week   Drug use: No   Sexual activity: Yes    Comment: lives with wife, no dietary restrictions, regular exercise  Other Topics  Concern   Not on file  Social History Narrative   Jewish   Lives alone (has a turtle and a bird)   Retired- Patent attorney for the [employer]   Enjoys running, gym   Social Determinants of Corporate investment banker Strain: Not on Ship broker Insecurity: Not on file  Transportation Needs: Not on file  Physical Activity: Not on file  Stress: Not on file  Social Connections: Not on file  Intimate Partner Violence: Not on file    Outpatient Medications Prior to Visit  Medication Sig Dispense Refill   alfuzosin (UROXATRAL) 10 MG 24 hr tablet Take 10 mg by mouth daily.     amphetamine-dextroamphetamine (ADDERALL) 20 MG tablet Take 1 tablet (20 mg total) by mouth 3 (three) times daily. 90 tablet 0   amphetamine-dextroamphetamine (ADDERALL) 20 MG tablet Take 1 tablet (20 mg total) by mouth 3 (three) times daily. 90 tablet 0   amphetamine-dextroamphetamine (ADDERALL) 20 MG tablet Take 1 tablet (20 mg total) by mouth 3 (three) times daily. 90 tablet 0   atorvastatin (LIPITOR) 80 MG tablet TAKE 1 TABLET BY MOUTH EVERY DAY 90 tablet 1   azelastine (ASTELIN) 0.1 % nasal spray Place 1 spray into both nostrils 2 (two) times daily. Use in each nostril as directed 30 mL 12   Coenzyme Q10 (COQ-10) 400 MG CAPS Take 400 mg by mouth daily. Reported on 10/28/2015     cyclobenzaprine (FLEXERIL) 5 MG tablet Take 1 tablet (5 mg total) by mouth 3 (three) times daily as needed for muscle spasms. 30 tablet 1   esomeprazole (NEXIUM) 40 MG capsule Take 1 capsule by mouth once daily 90 capsule 0   lisinopril-hydrochlorothiazide (ZESTORETIC) 20-12.5 MG tablet Take 1 tablet by mouth once daily 90 tablet 0   morphine (MS CONTIN) 15 MG 12 hr tablet Take 1 tablet (15 mg total) by mouth every 12 (twelve) hours. Fill when due. Not early. 60 tablet 0   naloxone (NARCAN) nasal spray 4 mg/0.1 mL 1 spray in 1 nostril prn respiratory suppression, may repeat every 2-3 minutes til responsive, alternating nostrils  until EMS arrives 2 each 1   traZODone (DESYREL) 100 MG tablet Take 0.5-2 tablets (50-200 mg total) by mouth at bedtime as needed for sleep. 180 tablet 1   zaleplon (SONATA) 10 MG capsule Take 1 capsule by mouth at bedtime for sleep, may repeat 1 capsule prn for mid nocturnal awakening with 3 hours left to sleep 60 capsule 5   No facility-administered medications prior to visit.    No Known Allergies  Review of Systems  Constitutional:  Negative for chills and fever.  Respiratory:  Negative for shortness of breath.   Cardiovascular:  Negative for chest pain and palpitations.  Gastrointestinal:  Positive  for diarrhea. Negative for blood in stool, nausea and vomiting.  Genitourinary:  Negative for dysuria, frequency, hematuria and urgency.  Musculoskeletal:  Positive for back pain (upper and thoracic back pain), joint pain (thumb pain) and neck pain.       (+) shoulder pain.  Skin:           Neurological:  Negative for headaches.       Objective:    Physical Exam Constitutional:      General: He is not in acute distress.    Appearance: Normal appearance. He is normal weight. He is not ill-appearing.  HENT:     Head: Normocephalic and atraumatic.     Right Ear: External ear normal.     Left Ear: External ear normal.     Nose: Nose normal.     Mouth/Throat:     Mouth: Mucous membranes are moist.     Pharynx: Oropharynx is clear.  Eyes:     General:        Right eye: No discharge.        Left eye: No discharge.     Extraocular Movements: Extraocular movements intact.     Conjunctiva/sclera: Conjunctivae normal.     Pupils: Pupils are equal, round, and reactive to light.  Cardiovascular:     Rate and Rhythm: Normal rate and regular rhythm.     Pulses: Normal pulses.     Heart sounds: Normal heart sounds. No murmur heard.    No gallop.  Pulmonary:     Effort: Pulmonary effort is normal. No respiratory distress.     Breath sounds: Normal breath sounds. No wheezing or rales.   Abdominal:     General: Bowel sounds are normal.     Palpations: Abdomen is soft.     Tenderness: There is no abdominal tenderness. There is no guarding.     Hernia: A hernia (just above the naval) is present.  Musculoskeletal:        General: Normal range of motion.     Cervical back: Normal range of motion.     Right lower leg: No edema.     Left lower leg: No edema.  Skin:    General: Skin is warm and dry.  Neurological:     Mental Status: He is alert and oriented to person, place, and time.  Psychiatric:        Mood and Affect: Mood normal.        Behavior: Behavior normal.        Judgment: Judgment normal.     There were no vitals taken for this visit. Wt Readings from Last 3 Encounters:  05/03/22 166 lb (75.3 kg)  04/26/22 166 lb (75.3 kg)  12/01/21 164 lb (74.4 kg)    Diabetic Foot Exam - Simple   No data filed    Lab Results  Component Value Date   WBC 4.3 12/01/2021   HGB 13.8 12/01/2021   HCT 40.6 12/01/2021   PLT 153.0 12/01/2021   GLUCOSE 104 (H) 05/04/2022   CHOL 125 12/01/2021   TRIG 68.0 12/01/2021   HDL 53.60 12/01/2021   LDLDIRECT 82.0 07/31/2014   LDLCALC 57 12/01/2021   ALT 22 12/01/2021   AST 21 12/01/2021   NA 140 05/04/2022   K 3.9 05/04/2022   CL 103 05/04/2022   CREATININE 1.09 05/04/2022   BUN 26 (H) 05/04/2022   CO2 29 05/04/2022   TSH 1.22 12/01/2021   PSA 4.37 (H) 07/31/2014   HGBA1C  5.7 12/01/2021    Lab Results  Component Value Date   TSH 1.22 12/01/2021   Lab Results  Component Value Date   WBC 4.3 12/01/2021   HGB 13.8 12/01/2021   HCT 40.6 12/01/2021   MCV 94.4 12/01/2021   PLT 153.0 12/01/2021   Lab Results  Component Value Date   NA 140 05/04/2022   K 3.9 05/04/2022   CO2 29 05/04/2022   GLUCOSE 104 (H) 05/04/2022   BUN 26 (H) 05/04/2022   CREATININE 1.09 05/04/2022   BILITOT 0.9 12/01/2021   ALKPHOS 64 12/01/2021   AST 21 12/01/2021   ALT 22 12/01/2021   PROT 6.3 12/01/2021   ALBUMIN 4.3 12/01/2021    CALCIUM 9.1 05/04/2022   ANIONGAP 10 01/06/2020   GFR 68.99 05/04/2022   Lab Results  Component Value Date   CHOL 125 12/01/2021   Lab Results  Component Value Date   HDL 53.60 12/01/2021   Lab Results  Component Value Date   LDLCALC 57 12/01/2021   Lab Results  Component Value Date   TRIG 68.0 12/01/2021   Lab Results  Component Value Date   CHOLHDL 2 12/01/2021   Lab Results  Component Value Date   HGBA1C 5.7 12/01/2021      Assessment & Plan:  Recurrent Diarrhea: Recommended Benefiber and healthy diet.  Upper/Thoracic Back Pain: Referral placed to [facility].  Essential Hypertension: This is well-controlled with Lisinopril-Hydrochlorothiazide 20-12.5 mg.  Immunizations: Encouraged patient to consider RSV and Shingles immunizations. Problem List Items Addressed This Visit       Cardiovascular and Mediastinum   Essential hypertension     Other   Attention deficit hyperactivity disorder (ADHD) - Primary   Hyperlipidemia   No orders of the defined types were placed in this encounter.  I, Vickey Sages, personally preformed the services described in this documentation.  All medical record entries made by the scribe were at my direction and in my presence.  I have reviewed the chart and discharge instructions (if applicable) and agree that the record reflects my personal performance and is accurate and complete. 08/01/2022  I,Mohammed Iqbal,acting as a scribe for Danise Edge, MD.,have documented all relevant documentation on the behalf of Danise Edge, MD,as directed by  Danise Edge, MD while in the presence of Danise Edge, MD.  Vickey Sages

## 2022-08-01 NOTE — Patient Instructions (Signed)
RSV, Respiratory Syncitial Virus vaccine, Arexvy vaccine, at pharmacy Shingrix is the new shingles shot, 2 shots over 2-6 months, confirm coverage with insurance and document, then can return here for shots with nurse appt or at pharmacy   Hypertension, Adult High blood pressure (hypertension) is when the force of blood pumping through the arteries is too strong. The arteries are the blood vessels that carry blood from the heart throughout the body. Hypertension forces the heart to work harder to pump blood and may cause arteries to become narrow or stiff. Untreated or uncontrolled hypertension can lead to a heart attack, heart failure, a stroke, kidney disease, and other problems. A blood pressure reading consists of a higher number over a lower number. Ideally, your blood pressure should be below 120/80. The first ("top") number is called the systolic pressure. It is a measure of the pressure in your arteries as your heart beats. The second ("bottom") number is called the diastolic pressure. It is a measure of the pressure in your arteries as the heart relaxes. What are the causes? The exact cause of this condition is not known. There are some conditions that result in high blood pressure. What increases the risk? Certain factors may make you more likely to develop high blood pressure. Some of these risk factors are under your control, including: Smoking. Not getting enough exercise or physical activity. Being overweight. Having too much fat, sugar, calories, or salt (sodium) in your diet. Drinking too much alcohol. Other risk factors include: Having a personal history of heart disease, diabetes, high cholesterol, or kidney disease. Stress. Having a family history of high blood pressure and high cholesterol. Having obstructive sleep apnea. Age. The risk increases with age. What are the signs or symptoms? High blood pressure may not cause symptoms. Very high blood pressure (hypertensive  crisis) may cause: Headache. Fast or irregular heartbeats (palpitations). Shortness of breath. Nosebleed. Nausea and vomiting. Vision changes. Severe chest pain, dizziness, and seizures. How is this diagnosed? This condition is diagnosed by measuring your blood pressure while you are seated, with your arm resting on a flat surface, your legs uncrossed, and your feet flat on the floor. The cuff of the blood pressure monitor will be placed directly against the skin of your upper arm at the level of your heart. Blood pressure should be measured at least twice using the same arm. Certain conditions can cause a difference in blood pressure between your right and left arms. If you have a high blood pressure reading during one visit or you have normal blood pressure with other risk factors, you may be asked to: Return on a different day to have your blood pressure checked again. Monitor your blood pressure at home for 1 week or longer. If you are diagnosed with hypertension, you may have other blood or imaging tests to help your health care provider understand your overall risk for other conditions. How is this treated? This condition is treated by making healthy lifestyle changes, such as eating healthy foods, exercising more, and reducing your alcohol intake. You may be referred for counseling on a healthy diet and physical activity. Your health care provider may prescribe medicine if lifestyle changes are not enough to get your blood pressure under control and if: Your systolic blood pressure is above 130. Your diastolic blood pressure is above 80. Your personal target blood pressure may vary depending on your medical conditions, your age, and other factors. Follow these instructions at home: Eating and drinking  Eat a  diet that is high in fiber and potassium, and low in sodium, added sugar, and fat. An example of this eating plan is called the DASH diet. DASH stands for Dietary Approaches to Stop  Hypertension. To eat this way: Eat plenty of fresh fruits and vegetables. Try to fill one half of your plate at each meal with fruits and vegetables. Eat whole grains, such as whole-wheat pasta, brown rice, or whole-grain bread. Fill about one fourth of your plate with whole grains. Eat or drink low-fat dairy products, such as skim milk or low-fat yogurt. Avoid fatty cuts of meat, processed or cured meats, and poultry with skin. Fill about one fourth of your plate with lean proteins, such as fish, chicken without skin, beans, eggs, or tofu. Avoid pre-made and processed foods. These tend to be higher in sodium, added sugar, and fat. Reduce your daily sodium intake. Many people with hypertension should eat less than 1,500 mg of sodium a day. Do not drink alcohol if: Your health care provider tells you not to drink. You are pregnant, may be pregnant, or are planning to become pregnant. If you drink alcohol: Limit how much you have to: 0-1 drink a day for women. 0-2 drinks a day for men. Know how much alcohol is in your drink. In the [place] one drink equals one 12 oz bottle of beer (355 mL), one 5 oz glass of wine (148 mL), or one 1 oz glass of hard liquor (44 mL). Lifestyle  Work with your health care provider to maintain a healthy body weight or to lose weight. Ask what an ideal weight is for you. Get at least 30 minutes of exercise that causes your heart to beat faster (aerobic exercise) most days of the week. Activities may include walking, swimming, or biking. Include exercise to strengthen your muscles (resistance exercise), such as Pilates or lifting weights, as part of your weekly exercise routine. Try to do these types of exercises for 30 minutes at least 3 days a week. Do not use any products that contain nicotine or tobacco. These products include cigarettes, chewing tobacco, and vaping devices, such as e-cigarettes. If you need help quitting, ask your health care provider. Monitor your  blood pressure at home as told by your health care provider. Keep all follow-up visits. This is important. Medicines Take over-the-counter and prescription medicines only as told by your health care provider. Follow directions carefully. Blood pressure medicines must be taken as prescribed. Do not skip doses of blood pressure medicine. Doing this puts you at risk for problems and can make the medicine less effective. Ask your health care provider about side effects or reactions to medicines that you should watch for. Contact a health care provider if you: Think you are having a reaction to a medicine you are taking. Have headaches that keep coming back (recurring). Feel dizzy. Have swelling in your ankles. Have trouble with your vision. Get help right away if you: Develop a severe headache or confusion. Have unusual weakness or numbness. Feel faint. Have severe pain in your chest or abdomen. Vomit repeatedly. Have trouble breathing. These symptoms may be an emergency. Get help right away. Call 911. Do not wait to see if the symptoms will go away. Do not drive yourself to the hospital. Summary Hypertension is when the force of blood pumping through your arteries is too strong. If this condition is not controlled, it may put you at risk for serious complications. Your personal target blood pressure may vary depending  on your medical conditions, your age, and other factors. For most people, a normal blood pressure is less than 120/80. Hypertension is treated with lifestyle changes, medicines, or a combination of both. Lifestyle changes include losing weight, eating a healthy, low-sodium diet, exercising more, and limiting alcohol. This information is not intended to replace advice given to you by your health care provider. Make sure you discuss any questions you have with your health care provider. Document Revised: 02/15/2021 Document Reviewed: 02/15/2021 Elsevier Patient Education  2023  [employer]

## 2022-08-02 ENCOUNTER — Other Ambulatory Visit: Payer: Self-pay | Admitting: Family Medicine

## 2022-08-02 DIAGNOSIS — M545 Low back pain, unspecified: Secondary | ICD-10-CM

## 2022-08-02 DIAGNOSIS — G8929 Other chronic pain: Secondary | ICD-10-CM

## 2022-08-02 DIAGNOSIS — M25551 Pain in right hip: Secondary | ICD-10-CM

## 2022-08-02 DIAGNOSIS — M25511 Pain in right shoulder: Secondary | ICD-10-CM | POA: Insufficient documentation

## 2022-08-02 NOTE — Assessment & Plan Note (Signed)
No recent trauma but his increasing shoulder pain is fairly new. He is interested in evaluation by ortho and wants to discuss treatment options. Can continue current pain meds for now

## 2022-08-02 NOTE — Assessment & Plan Note (Signed)
Encouraged moist heat and gentle stretching as tolerated. May try NSAIDs and prescription meds as directed and report if symptoms worsen or seek immediate care referred to ortho for further treatment options. Patient interested in avoiding surgery but would consider other modalities and needs to hear his options

## 2022-08-03 ENCOUNTER — Other Ambulatory Visit: Payer: Self-pay

## 2022-08-03 ENCOUNTER — Encounter: Payer: Self-pay | Admitting: Sports Medicine

## 2022-08-03 ENCOUNTER — Ambulatory Visit (INDEPENDENT_AMBULATORY_CARE_PROVIDER_SITE_OTHER): Payer: Federal, State, Local not specified - PPO | Admitting: Sports Medicine

## 2022-08-03 DIAGNOSIS — M545 Low back pain, unspecified: Secondary | ICD-10-CM | POA: Diagnosis not present

## 2022-08-03 DIAGNOSIS — M47814 Spondylosis without myelopathy or radiculopathy, thoracic region: Secondary | ICD-10-CM

## 2022-08-03 DIAGNOSIS — M546 Pain in thoracic spine: Secondary | ICD-10-CM | POA: Diagnosis not present

## 2022-08-03 DIAGNOSIS — M549 Dorsalgia, unspecified: Secondary | ICD-10-CM

## 2022-08-03 DIAGNOSIS — G8929 Other chronic pain: Secondary | ICD-10-CM

## 2022-08-03 MED ORDER — CELECOXIB 200 MG PO CAPS
200.0000 mg | ORAL_CAPSULE | Freq: Every day | ORAL | 0 refills | Status: DC
Start: 2022-08-03 — End: 2022-09-05

## 2022-08-03 MED ORDER — LIDOCAINE HCL 1 % IJ SOLN
1.0000 mL | INTRAMUSCULAR | Status: AC | PRN
Start: 2022-08-03 — End: 2022-08-03
  Administered 2022-08-03: 1 mL

## 2022-08-03 MED ORDER — BUPIVACAINE HCL 0.25 % IJ SOLN
1.0000 mL | INTRAMUSCULAR | Status: AC | PRN
Start: 2022-08-03 — End: 2022-08-03
  Administered 2022-08-03: 1 mL

## 2022-08-03 MED ORDER — CELECOXIB 200 MG PO CAPS: 200.0000 mg | ORAL_CAPSULE | Freq: Two times a day (BID) | ORAL | 0 refills | Status: DC

## 2022-08-03 MED ORDER — METHYLPREDNISOLONE ACETATE 40 MG/ML IJ SUSP
40.0000 mg | INTRAMUSCULAR | Status: AC | PRN
Start: 2022-08-03 — End: 2022-08-03
  Administered 2022-08-03: 40 mg via INTRAMUSCULAR

## 2022-08-03 MED ORDER — LIDOCAINE HCL 1 % IJ SOLN
2.0000 mL | INTRAMUSCULAR | Status: AC | PRN
Start: 2022-08-03 — End: 2022-08-03
  Administered 2022-08-03: 2 mL

## 2022-08-03 MED ORDER — MORPHINE SULFATE ER 15 MG PO TBCR: 15.0000 mg | EXTENDED_RELEASE_TABLET | Freq: Two times a day (BID) | ORAL | 0 refills | Status: DC

## 2022-08-03 NOTE — Progress Notes (Signed)
Mid back pain; in between shoulder blades Pain for 15+ years History of cervical spine surgery Sitting increases pain  Takes 15mg  morphine daily with no relief

## 2022-08-03 NOTE — Progress Notes (Signed)
PURL CLAYTOR - 72 y.o. male MRN [id number]  Date of birth: 1950/07/28  Office Visit Note: Visit Date: 08/03/2022 PCP: Bradd Canary, MD Referred by: Bradd Canary, MD  Subjective: Chief Complaint  Patient presents with   Middle Back - Pain   HPI: Gregory Carr is a pleasant 72 y.o. male who presents today for acute on chronic mid back and low back pain.  Gregory Carr has had mid back pain between his shoulder blades as well as thoracic and lumbar spine pain for many years now.  States he has had at least 15 years of pain or more.  He is managed on chronic pain medication, morphine 15 mg SR twice daily.  He also has tried ice, heat as well as alternating ibuprofen and Tylenol only with some relief of his pain.  Reports pain and tightness in the spine between the shoulder blades.  He denies any numbness or tingling in the arms or legs.  He did have some upper extremity radicular symptoms in years past which is when he had his cervical fusion done many years ago.  [facility] - Dr. Julio Sicks - performed fusion at C5-6 many years ago. Reviewed per chart review, as patient forgot name of doc.  He is not a diabetic.  Lab Results  Component Value Date   HGBA1C 5.7 12/01/2021    Pertinent ROS were reviewed with the patient and found to be negative unless otherwise specified above in HPI.   Assessment & Plan: Visit Diagnoses:  1. Thoracic spondyloarthritis   2. Mid back pain   3. Trigger point of thoracic region   4. Chronic midline low back pain without sciatica    Plan: Discussed with [name] today he has quite significant and diffuse arthritic changes throughout the lumbar spine with notable anterior bridging.  I do think this is likely the origin of his pain, however he does have reciprocal muscle hypertonicity and trigger points in the mid thoracic spine and right medial scapular border.  Through shared decision-making, did proceed with trigger point injections in these  locations, patient tolerated well.  We will get him started on Celebrex 200 mg to be taken once daily.  He will discontinue his over-the-counter ibuprofen and Motrin use.  We discussed other treatment options such as formalized physical therapy where they can do rehab, dry needling, stim, etc. he would like to hold on this for now but we will consider this in the future if he does not get good relief from this.  Other treatment options may be consideration of OMT, LD-prednisone. He will f/u in 1 month.   Follow-up: Return in about 1 month (around 09/02/2022) for for back pain.   Meds & Orders:  Meds ordered this encounter  Medications   DISCONTD: celecoxib (CELEBREX) 200 MG capsule    Sig: Take 1 capsule (200 mg total) by mouth 2 (two) times daily.    Dispense:  60 capsule    Refill:  0   celecoxib (CELEBREX) 200 MG capsule    Sig: Take 1 capsule (200 mg total) by mouth daily.    Dispense:  60 capsule    Refill:  0    Orders Placed This Encounter  Procedures   Trigger Point Inj     Procedures: Trigger Point Inj  Date/Time: 08/03/2022 11:22 AM  Performed by: Madelyn Brunner, DO Authorized by: Madelyn Brunner, DO   Consent Given by:  Patient Site marked: the procedure site was marked  Timeout: prior to procedure the correct patient, procedure, and site was verified   Indications:  Pain and therapeutic Total # of Trigger Points:  3 or more Location: back and shoulder   Needle Size:  25 G Approach:  Dorsal Medications #1:  2 mL lidocaine 1 %; 1 mL bupivacaine 0.25 %; 40 mg methylPREDNISolone acetate 40 MG/ML Medications #2:  40 mg methylPREDNISolone acetate 40 MG/ML; 1 mL bupivacaine 0.25 %; 1 mL lidocaine 1 % Medications #3:  1 mL lidocaine 1 %; 1 mL bupivacaine 0.25 % Additional Injections?: Yes   Medications #4:  1 mL lidocaine 1 %; 1 mL bupivacaine 0.25 % Patient tolerance:  Patient tolerated the procedure well with no immediate complications Comments: Procedure: Trigger point  injections (4), Thoracic paraspinal musculature and R-medial scapular border After discussion on R/B/I and informed verbal consent was obtained, a timeout was conducted. The patient was placed in a prone position on the examination table and the area of maximal tenderness was identified over the thoracic paraspinal musculature and R-medial scapular border.  This area was cleansed with Betadine and multiple alcohol swabs. Ethyl chloride was used for local anesthesia. Using a 25-gauge 1.0 inch needle the trigger point(s) was subsequently injected with a mixture of 0.5:0.5:0.5 lidocaine:bupivicaine:methylprednislone into each trigger point, with a total injectate of 6 mL used between all 4 trigger points. A band-aid was applied following. Patient tolerated procedure well, there were no post-injection complications. Post-procedure instructions were given.         Clinical History: No specialty comments available.  He reports that he quit smoking about 46 years ago. His smoking use included cigarettes. He has never used smokeless tobacco.  Recent Labs    12/01/21 1122  HGBA1C 5.7    Objective:    Physical Exam  Gen: Well-appearing, in no acute distress; non-toxic CV: Well-perfused. Warm.  Resp: Breathing unlabored on room air; no wheezing. Psych: Fluid speech in conversation; appropriate affect; normal thought process Neuro: Sensation intact throughout. No gross coordination deficits.   Ortho Exam - Cervico/Thoracic spine: No midline spinous process TTP.  Well-healed incision from prior cervical fusion.  Negative Spurling's maneuver bilaterally.  5/5 strength of bilateral upper extremities.  There is paraspinal hypertonicity of the right greater than left thoracic musculature.  Also associated trigger point in this region and over the right medial scapular border.  - Lumbar spine: No midline spinous process TTP.  Full range of motion with flexion and extension.  Negative straight leg raise  bilaterally.  Imaging:  *Independent review of thoracic spine CAT scan from 02/25/21 was reviewed interpreted by myself.  CT scan is notable arthritic changes throughout the entirety of the thoracic spine with multilevel anterior bridging enthesophytes.  Possible small Schmorl's nodes noted, although no evidence of DISH or anklylosis spondylitis of the thoracic spine.  Narrative & Impression  CLINICAL DATA:  Ongoing mid and lower back pain.   EXAM: CT THORACIC SPINE WITHOUT CONTRAST   TECHNIQUE: Multidetector CT images of the thoracic were obtained using the standard protocol without intravenous contrast.   COMPARISON:  CTA chest and reconstructions of 01/11/2011   FINDINGS: Alignment: There is mild dextroscoliosis.  There is no AP listhesis.   Vertebrae: Mild osteopenia. There is a chronic multilevel anterior and posterior fusion construct partially visible in the lower cervical spine. Thoracic vertebra are normal in heights. There are multilevel prominent marginal osteophytes and extensive anterior bridging enthesopathy eccentric to the right. The enthesopathy has progressed from 10 years ago. There are  no concerning lytic or blastic bone lesions. There are endplate Schmorl's nodes at a few levels.   Paraspinal and other soft tissues: There are surgical clips anterior to the upper left kidney and appearance of the partial left nephrectomy. There is no thoracic aortic aneurysm, paraspinal mass or fluid collection.   Disc levels: The thoracic discs are partially degenerated, more so in the upper half of the thoracic spine than in the lower half. Spinal canal contents are not optimally evaluated without intrathecal contrast but there is no visible herniated disc or cord compromise, no significant soft tissue or bony encroachment on the thecal sac is suspected.   There are facet spurs at most levels. Foraminal stenosis is noted which is bilaterally moderate and T1-2, moderate  on the right at T2-3 and T3-4 , bilaterally mild at T4-5 and T5-6. Other foramina are clear.   IMPRESSION: 1. Mild dextroscoliosis without AP listhesis. 2. Extensive multilevel bridging enthesopathy, progressive from 2012. Additional marginal osteophytosis. 3. Osteopenia and degenerative change. No spinal compression injury. 4. Old multilevel anterior and posterior cervical fusion construct. 5. Prior subtotal left nephrectomy.     Electronically Signed   By: Almira Bar M.D.   On: 02/26/2021 03:00   Narrative & Impression  CLINICAL DATA:  Thoracic back pain.   EXAM: THORACIC SPINE 2 VIEWS   COMPARISON:  Chest CT 01/11/2011   FINDINGS: Anterior and posterior surgical fusion in the cervical spine. Normal alignment at the cervicothoracic junction. Bridging osteophytes along the right side of the thoracic spine. Vertebral body heights in the thoracic spine are maintained. Multiple surgical clips in the left upper abdomen.   IMPRESSION: Degenerative changes in the thoracic spine without acute abnormality.     Electronically Signed   By: Richarda Overlie M.D.   On: 08/06/2020 08:48    Narrative & Impression  CLINICAL DATA:  Chronic low back pain   EXAM: LUMBAR SPINE - COMPLETE 4+ VIEW   COMPARISON:  MRI 08/29/2017   FINDINGS: Normal lumbar lordosis. There is progressive intervertebral disc space narrowing and development of grade 1 anterolisthesis at L4-5 in keeping with progressive degenerative disc disease at this level. No acute fracture of the lumbar spine. Vertebral body heights have been preserved. Remaining intervertebral disc heights are preserved. Oblique views demonstrate no evidence of pars defect. Mild facet arthrosis is noted on the left at L3-4 and L4-5 and on the right at L4-5. This is not optimally profiled on this examination. The paraspinal soft tissues are unremarkable. Numerous surgical clips are seen within the left paraspinal region, possibly  related to interval nephrectomy.   IMPRESSION: Progressive degenerative disc disease at L4-5 with development of grade 1 anterolisthesis.   Mild degenerative disc disease of the lower lumbar spine as described above.     Electronically Signed   By: Helyn Numbers M.D.   On: 02/08/2021 08:15    Past Medical/Family/Surgical/Social History: Medications & Allergies reviewed per EMR, new medications updated. Patient Active Problem List   Diagnosis Date Noted   Bilateral shoulder pain 08/02/2022   Glucose intolerance 12/01/2021   Diarrhea 01/30/2020   GAD (generalized anxiety disorder) 05/24/2018   Leg cramps 05/13/2018   Umbilical hernia without obstruction and without gangrene 05/13/2018   Right hip pain 07/21/2016   Grief reaction 11/07/2015   Abdominal pain, left lower quadrant 01/17/2015   Hyperlipidemia 01/26/2014   Preventative health care 09/29/2013   Hallux limitus of left foot 11/08/2012   Onychomycosis 09/13/2012   BPH (benign prostatic hyperplasia)  09/13/2012   Renal cell carcinoma 12/15/2011   FATIGUE 10/04/2009   MYALGIA 10/12/2008   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   GLUCOSE INTOLERANCE 12/11/2007   OTHER DECREASED WHITE BLOOD CELL COUNT 12/11/2007   Jaundice, non-neonatal 12/11/2007   BARRETTS ESOPHAGUS 11/11/2007   Back pain 11/11/2007   Inguinal hernia 05/13/2007   OTHER ANXIETY STATES 04/08/2007   APHTHOUS ULCERS 04/08/2007   Essential hypertension 08/16/2006   ERECTILE DYSFUNCTION 08/16/2006   Attention deficit hyperactivity disorder (ADHD) 08/16/2006   GERD 08/16/2006   LOW BACK PAIN 08/16/2006   INSOMNIA 08/16/2006   Past Medical History:  Diagnosis Date   Abdominal pain, left lower quadrant 01/17/2015   BACK PAIN, CHRONIC 11/11/2007   Qualifier: Diagnosis of  By: Andrey CampanileWilson MD, [place] BilberryLauraLee     BARRETTS ESOPHAGUS 11/11/2007   Qualifier: Diagnosis of  By: Andrey CampanileWilson MD, LauraLee     BPH (benign prostatic hyperplasia) 09/13/2012   BPH (benign  prostatic hyperplasia) 09/13/2012   Follows with [facility]    ERECTILE DYSFUNCTION 08/16/2006   Qualifier: Diagnosis of  By: Blossom HoopsAlejandro MD, Luis     GERD 08/16/2006   Qualifier: Diagnosis of  By: Blossom HoopsAlejandro MD, Jiles CrockerLuis     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   Qualifier: Diagnosis of  By: Andrey CampanileWilson MD, LauraLee     GLUCOSE INTOLERANCE 12/11/2007   Qualifier: Diagnosis of  By: Andrey CampanileWilson MD, LauraLee      Grief reaction 11/07/2015   HTN (hypertension) 03/24/2011   HYPERLIPIDEMIA 08/16/2006   Qualifier: Diagnosis of  By: Blossom HoopsAlejandro MD, Luis     Onychomycosis 09/13/2012   Other anxiety states 04/08/2007   Qualifier: Diagnosis of  By: Blossom HoopsAlejandro MD, Luis     Preventative health care 09/29/2013   Renal cell carcinoma (HCC) 12/15/2011   S/p left nephrectomy Follows with [facility]    Right hip pain 07/21/2016   Family History  Problem Relation Age of Onset   Dementia Mother    Cancer Daughter        brain cancer, glioblastoma   Hypertension Maternal Grandfather    Heart disease Maternal Grandfather        MI   Stroke Paternal Grandfather    Arthritis Sister    Past Surgical History:  Procedure Laterality Date   CERVICAL SPINE SURGERY     PARTIAL NEPHRECTOMY Left    around 2003   Social History   Occupational History   Not on file  Tobacco Use   Smoking status: Former    Types: Cigarettes    Quit date: 08/23/1975    Years since quitting: 46.9   Smokeless tobacco: Never  Substance and Sexual Activity   Alcohol use: Yes    Alcohol/week: 1.0 - 2.0 standard drink of alcohol    Types: 1 - 2 Cans of beer per week   Drug use: No   Sexual activity: Yes    Comment: lives with wife, no dietary restrictions, regular exercise

## 2022-08-03 NOTE — Telephone Encounter (Signed)
Requesting: morphine 15mg   Contract: 01/26/2014 UDS: 08/23/21 Last Visit: 08/01/22 Next Visit: 01/29/23 Last Refill: 07/05/22 #60 and 0RF   Please Advise

## 2022-08-27 ENCOUNTER — Other Ambulatory Visit: Payer: Self-pay | Admitting: Sports Medicine

## 2022-08-27 DIAGNOSIS — M549 Dorsalgia, unspecified: Secondary | ICD-10-CM

## 2022-08-27 DIAGNOSIS — M546 Pain in thoracic spine: Secondary | ICD-10-CM

## 2022-08-27 DIAGNOSIS — G8929 Other chronic pain: Secondary | ICD-10-CM

## 2022-08-27 DIAGNOSIS — M47814 Spondylosis without myelopathy or radiculopathy, thoracic region: Secondary | ICD-10-CM

## 2022-08-31 ENCOUNTER — Telehealth: Payer: Self-pay | Admitting: Physician Assistant

## 2022-08-31 ENCOUNTER — Other Ambulatory Visit: Payer: Self-pay | Admitting: Family Medicine

## 2022-08-31 ENCOUNTER — Other Ambulatory Visit: Payer: Self-pay | Admitting: Physician Assistant

## 2022-08-31 DIAGNOSIS — G8929 Other chronic pain: Secondary | ICD-10-CM

## 2022-08-31 DIAGNOSIS — M545 Low back pain, unspecified: Secondary | ICD-10-CM

## 2022-08-31 DIAGNOSIS — M25551 Pain in right hip: Secondary | ICD-10-CM

## 2022-08-31 MED ORDER — MORPHINE SULFATE ER 15 MG PO TBCR
15.0000 mg | EXTENDED_RELEASE_TABLET | Freq: Two times a day (BID) | ORAL | 0 refills | Status: DC
Start: 2022-08-31 — End: 2022-10-01

## 2022-08-31 MED ORDER — AMPHETAMINE-DEXTROAMPHETAMINE 20 MG PO TABS: 20.0000 mg | ORAL_TABLET | Freq: Three times a day (TID) | ORAL | 0 refills | Status: DC

## 2022-08-31 MED ORDER — ZALEPLON 10 MG PO CAPS: ORAL_CAPSULE | ORAL | 5 refills | Status: DC

## 2022-08-31 NOTE — Telephone Encounter (Signed)
Next appt is 10/13/22. Requesting refills on Adderall 20 mg called to:  [id number] [place] [address], [place] - [address]   Phone: [phone]  Fax: [phone]    And Z[place]plon called to:  [facility] [address] [place] - [address]   Phone: [phone]  Fax: [phone]

## 2022-08-31 NOTE — Telephone Encounter (Signed)
sent 

## 2022-08-31 NOTE — Telephone Encounter (Signed)
Requesting:morphine 15mg  Contract: None UDS: 08/23/21 Last Visit: 08/01/22 Next Visit: 01/29/23 Last Refill: 08/03/22 #60 and 0RF  Please Advise

## 2022-09-04 ENCOUNTER — Ambulatory Visit: Payer: Federal, State, Local not specified - PPO | Admitting: Sports Medicine

## 2022-09-05 ENCOUNTER — Encounter: Payer: Self-pay | Admitting: Sports Medicine

## 2022-09-05 ENCOUNTER — Ambulatory Visit: Payer: Federal, State, Local not specified - PPO | Admitting: Sports Medicine

## 2022-09-05 DIAGNOSIS — M5136 Other intervertebral disc degeneration, lumbar region: Secondary | ICD-10-CM

## 2022-09-05 DIAGNOSIS — M47814 Spondylosis without myelopathy or radiculopathy, thoracic region: Secondary | ICD-10-CM

## 2022-09-05 DIAGNOSIS — M545 Low back pain, unspecified: Secondary | ICD-10-CM

## 2022-09-05 DIAGNOSIS — M549 Dorsalgia, unspecified: Secondary | ICD-10-CM | POA: Diagnosis not present

## 2022-09-05 DIAGNOSIS — G8929 Other chronic pain: Secondary | ICD-10-CM

## 2022-09-05 DIAGNOSIS — M899 Disorder of bone, unspecified: Secondary | ICD-10-CM

## 2022-09-05 MED ORDER — CELECOXIB 200 MG PO CAPS
200.0000 mg | ORAL_CAPSULE | Freq: Every day | ORAL | 1 refills | Status: AC
Start: 2022-09-05 — End: ?

## 2022-09-05 NOTE — Progress Notes (Signed)
Gregory Carr - 72 y.o. male MRN [id number]  Date of birth: 12/17/1950  Office Visit Note: Visit Date: 09/05/2022 PCP: Bradd Canary, MD Referred by: Bradd Canary, MD  Subjective: Chief Complaint  Patient presents with   Middle Back - Follow-up   HPI: Gregory Carr is a pleasant 72 y.o. male who presents today for follow-up of mid and lower back pain.  He does have a known severe thoracic and lumbar significant DJD with multilevel bridging.  He is managed on chronic pain medication, morphine 15 mg SR twice daily.  At our last visit we did perform trigger point injections and did start him on Celebrex 200 mg once daily.  He states with both of these modalities he is significantly improved, feels like he is about 70% better.  He was very pleased with his improvement.  Still has chronic pain but this is enabling him to get through the day with most activities.   Pertinent ROS were reviewed with the patient and found to be negative unless otherwise specified above in HPI.   Assessment & Plan: Visit Diagnoses:  1. DDD (degenerative disc disease), lumbar   2. Thoracic spondyloarthritis   3. Mid back pain   4. Chronic midline low back pain without sciatica   5. Scapular dysfunction    Plan: Discussed with Gregory Carr that both he and I were pleased with the rather significant improvement he received from prior trigger point injection, activity modification and Celebrex therapy.  We did discuss that he has rather significant degenerative disc disease of both the thoracic and the lumbar spine, but with these treatment modalities he is much better able to get through his activities of daily living and exercise with little pain.  We will refill his Celebrex to be taken 200 mg once daily.  He can continue his chronic morphine as well from outside provider.  Discussed performing infrequent trigger point injections as needed.  I did go through some scapular retractions and low back exercises for  him to incorporate at home as well as at the gym.  He will follow-up with me in about 2 months.  Follow-up: Return in about 2 months (around 11/05/2022) for for mid and low back DDD.   Meds & Orders:  Meds ordered this encounter  Medications   celecoxib (CELEBREX) 200 MG capsule    Sig: Take 1 capsule (200 mg total) by mouth daily.    Dispense:  60 capsule    Refill:  1   No orders of the defined types were placed in this encounter.    Procedures: No procedures performed      Clinical History: No specialty comments available.  He reports that he quit smoking about 47 years ago. His smoking use included cigarettes. He has never used smokeless tobacco.  Recent Labs    12/01/21 1122  HGBA1C 5.7    Objective:    Physical Exam  Gen: Well-appearing, in no acute distress; non-toxic [id number]. Warm.  Resp: Breathing unlabored on room air; no wheezing. Psych: Fluid speech in conversation; appropriate affect; normal thought process Neuro: Sensation intact throughout. No gross coordination deficits.   Ortho Exam - Thoracic and Lumbar spine: There is bilateral paraspinal hypertonicity throughout the lumbar spine although improved from previous visits.  There is some scapular dyskinesia with hypertonicity of the right medial scapular border compared to the left.  He has full use and strength of the upper and lower extremities bilaterally.  Imaging: Narrative & Impression  CLINICAL DATA:  Ongoing mid and lower back pain.   EXAM: CT THORACIC SPINE WITHOUT CONTRAST   TECHNIQUE: Multidetector CT images of the thoracic were obtained using the standard protocol without intravenous contrast.   COMPARISON:  CTA chest and reconstructions of 01/11/2011   FINDINGS: Alignment: There is mild dextroscoliosis.  There is no AP listhesis.   Vertebrae: Mild osteopenia. There is a chronic multilevel anterior and posterior fusion construct partially visible in the lower cervical spine.  Thoracic vertebra are normal in heights. There are multilevel prominent marginal osteophytes and extensive anterior bridging enthesopathy eccentric to the right. The enthesopathy has progressed from 10 years ago. There are no concerning lytic or blastic bone lesions. There are endplate Schmorl's nodes at a few levels.   Paraspinal and other soft tissues: There are surgical clips anterior to the upper left kidney and appearance of the partial left nephrectomy. There is no thoracic aortic aneurysm, paraspinal mass or fluid collection.   Disc levels: The thoracic discs are partially degenerated, more so in the upper half of the thoracic spine than in the lower half. Spinal canal contents are not optimally evaluated without intrathecal contrast but there is no visible herniated disc or cord compromise, no significant soft tissue or bony encroachment on the thecal sac is suspected.   There are facet spurs at most levels. Foraminal stenosis is noted which is bilaterally moderate and T1-2, moderate on the right at T2-3 and T3-4 , bilaterally mild at T4-5 and T5-6. Other foramina are clear.   IMPRESSION: 1. Mild dextroscoliosis without AP listhesis. 2. Extensive multilevel bridging enthesopathy, progressive from 2012. Additional marginal osteophytosis. 3. Osteopenia and degenerative change. No spinal compression injury. 4. Old multilevel anterior and posterior cervical fusion construct. 5. Prior subtotal left nephrectomy.     Electronically Signed   By: Almira Bar M.D.   On: 02/26/2021 03:00    Past Medical/Family/Surgical/Social History: Medications & Allergies reviewed per EMR, new medications updated. Patient Active Problem List   Diagnosis Date Noted   Bilateral shoulder pain 08/02/2022   Glucose intolerance 12/01/2021   Diarrhea 01/30/2020   GAD (generalized anxiety disorder) 05/24/2018   Leg cramps 05/13/2018   Umbilical hernia without obstruction and without gangrene  05/13/2018   Right hip pain 07/21/2016   Grief reaction 11/07/2015   Abdominal pain, left lower quadrant 01/17/2015   Hyperlipidemia 01/26/2014   Preventative health care 09/29/2013   Hallux limitus of left foot 11/08/2012   Onychomycosis 09/13/2012   BPH (benign prostatic hyperplasia) 09/13/2012   Renal cell carcinoma (HCC) 12/15/2011   FATIGUE 10/04/2009   MYALGIA 10/12/2008   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   GLUCOSE INTOLERANCE 12/11/2007   OTHER DECREASED WHITE BLOOD CELL COUNT 12/11/2007   Jaundice, non-neonatal 12/11/2007   BARRETTS ESOPHAGUS 11/11/2007   Back pain 11/11/2007   Inguinal hernia 05/13/2007   OTHER ANXIETY STATES 04/08/2007   APHTHOUS ULCERS 04/08/2007   Essential hypertension 08/16/2006   ERECTILE DYSFUNCTION 08/16/2006   Attention deficit hyperactivity disorder (ADHD) 08/16/2006   GERD 08/16/2006   LOW BACK PAIN 08/16/2006   INSOMNIA 08/16/2006   Past Medical History:  Diagnosis Date   Abdominal pain, left lower quadrant 01/17/2015   BACK PAIN, CHRONIC 11/11/2007   Qualifier: Diagnosis of  By: Andrey Campanile MD, [place] Bilberry ESOPHAGUS 11/11/2007   Qualifier: Diagnosis of  By: Andrey Campanile MD, LauraLee     BPH (benign prostatic hyperplasia) 09/13/2012   BPH (benign prostatic hyperplasia) 09/13/2012   Follows with [facility]    ERECTILE DYSFUNCTION 08/16/2006   Qualifier: Diagnosis of  By: Blossom Hoops MD, Luis     GERD 08/16/2006   Qualifier: Diagnosis of  By: Blossom Hoops MD, Jiles Crocker SYNDROME 01/22/2008   Qualifier: Diagnosis of  By: Andrey Campanile MD, LauraLee     GLUCOSE INTOLERANCE 12/11/2007   Qualifier: Diagnosis of  By: Andrey Campanile MD, LauraLee      Grief reaction 11/07/2015   HTN (hypertension) 03/24/2011   HYPERLIPIDEMIA 08/16/2006   Qualifier: Diagnosis of  By: Blossom Hoops MD, Luis     Onychomycosis 09/13/2012   Other anxiety states 04/08/2007   Qualifier: Diagnosis of  By: Blossom Hoops MD, Luis     Preventative health care 09/29/2013   Renal  cell carcinoma (HCC) 12/15/2011   S/p left nephrectomy Follows with [facility]    Right hip pain 07/21/2016   Family History  Problem Relation Age of Onset   Dementia Mother    Cancer Daughter        brain cancer, glioblastoma   Hypertension Maternal Grandfather    Heart disease Maternal Grandfather        MI   Stroke Paternal Grandfather    Arthritis Sister    Past Surgical History:  Procedure Laterality Date   CERVICAL SPINE SURGERY     PARTIAL NEPHRECTOMY Left    around 2003   Social History   Occupational History   Not on file  Tobacco Use   Smoking status: Former    Types: Cigarettes    Quit date: 08/23/1975    Years since quitting: 47.0   Smokeless tobacco: Never  Substance and Sexual Activity   Alcohol use: Yes    Alcohol/week: 1.0 - 2.0 standard drink of alcohol    Types: 1 - 2 Cans of beer per week   Drug use: No   Sexual activity: Yes    Comment: lives with wife, no dietary restrictions, regular exercise

## 2022-09-05 NOTE — Progress Notes (Signed)
States he is doing better; injections helped and so did the medicine Not 100% but definitely improved

## 2022-09-27 ENCOUNTER — Other Ambulatory Visit: Payer: Self-pay | Admitting: Family Medicine

## 2022-09-27 DIAGNOSIS — I10 Essential (primary) hypertension: Secondary | ICD-10-CM

## 2022-10-01 ENCOUNTER — Other Ambulatory Visit: Payer: Self-pay | Admitting: Family Medicine

## 2022-10-01 DIAGNOSIS — G8929 Other chronic pain: Secondary | ICD-10-CM

## 2022-10-01 DIAGNOSIS — M25551 Pain in right hip: Secondary | ICD-10-CM

## 2022-10-02 MED ORDER — MORPHINE SULFATE ER 15 MG PO TBCR
15.0000 mg | EXTENDED_RELEASE_TABLET | Freq: Two times a day (BID) | ORAL | 0 refills | Status: DC
Start: 2022-10-02 — End: 2022-11-03

## 2022-10-02 NOTE — Telephone Encounter (Signed)
Requesting: morphine 15mg   Contract: 01/26/2014 UDS: 08/23/21 Last Visit: 08/01/22 Next Visit: 01/29/23 Last Refill: 08/31/22 #60 and 0RF   Please Advise

## 2022-10-07 ENCOUNTER — Other Ambulatory Visit: Payer: Self-pay | Admitting: Family Medicine

## 2022-10-09 MED ORDER — ESOMEPRAZOLE MAGNESIUM 40 MG PO CPDR: 40.0000 mg | DELAYED_RELEASE_CAPSULE | Freq: Every day | ORAL | 0 refills | Status: DC

## 2022-10-13 ENCOUNTER — Ambulatory Visit (INDEPENDENT_AMBULATORY_CARE_PROVIDER_SITE_OTHER): Payer: Self-pay | Admitting: Physician Assistant

## 2022-10-13 DIAGNOSIS — Z91199 Patient's noncompliance with other medical treatment and regimen due to unspecified reason: Secondary | ICD-10-CM

## 2022-10-13 NOTE — Progress Notes (Signed)
No show

## 2022-10-18 ENCOUNTER — Encounter: Payer: Self-pay | Admitting: Physician Assistant

## 2022-10-18 ENCOUNTER — Ambulatory Visit (INDEPENDENT_AMBULATORY_CARE_PROVIDER_SITE_OTHER): Payer: Federal, State, Local not specified - PPO | Admitting: Physician Assistant

## 2022-10-18 DIAGNOSIS — F3342 Major depressive disorder, recurrent, in full remission: Secondary | ICD-10-CM

## 2022-10-18 DIAGNOSIS — G4700 Insomnia, unspecified: Secondary | ICD-10-CM

## 2022-10-18 DIAGNOSIS — F902 Attention-deficit hyperactivity disorder, combined type: Secondary | ICD-10-CM | POA: Diagnosis not present

## 2022-10-18 MED ORDER — AMPHETAMINE-DEXTROAMPHETAMINE 20 MG PO TABS: 20.0000 mg | ORAL_TABLET | Freq: Three times a day (TID) | ORAL | 0 refills | Status: DC

## 2022-10-18 MED ORDER — TRAZODONE HCL 100 MG PO TABS: 50.0000 mg | ORAL_TABLET | Freq: Every evening | ORAL | 1 refills | Status: DC | PRN

## 2022-10-18 NOTE — Progress Notes (Signed)
[facility]  Patient ID: Gregory Carr,  MRN: [id number]  PCP: Bradd Canary, MD  Date of Evaluation: 10/18/2022 Time spent: 22 minutes  Chief Complaint:  Chief Complaint   Anxiety; Depression    HISTORY/CURRENT STATUS: HPI routine 2-month med check.  Gregory Carr is doing well.  He feels like his medications are working like they should.  He is able to enjoy things although does not like to do things alone.  Since his wife died several years ago it has been hard for him to want to go on trips or anything like that without her.  But overall he is doing fine.  His energy and motivation are good.  ADLs and personal hygiene are normal.  Appetite is normal and weight is stable.  Does not cry easily, does not feel hopeless.  He is retired.  He is sleeps well with the Sonata.  If he does not take it he has trouble both going to sleep and staying asleep.  No suicidal or homicidal thoughts.  States that attention is good without easy distractibility.  Able to focus on things and finish tasks to completion.   Patient denies increased energy with decreased need for sleep, increased talkativeness, racing thoughts, impulsivity or risky behaviors, increased spending, increased libido, grandiosity, increased irritability or anger, paranoia, or hallucinations.  Denies dizziness, syncope, seizures, numbness, tingling, tremor, tics, unsteady gait, slurred speech, confusion. Denies muscle or joint pain, stiffness, or dystonia.  Individual Medical History/ Review of Systems: Changes? :No    Past medications for mental health diagnoses include: Ambien, Belsomra, Remeron, Restoril, Serzone, Cymbalta, Trazodone, Effexor XR, Sonata.   Allergies: Patient has no known allergies.  Current Medications:  Current Outpatient Medications:    alfuzosin (UROXATRAL) 10 MG 24 hr tablet, Take 10 mg by mouth daily., Disp: , Rfl:    [START ON 10/29/2022] amphetamine-dextroamphetamine (ADDERALL) 20 MG tablet,  Take 1 tablet (20 mg total) by mouth 3 (three) times daily., Disp: 90 tablet, Rfl: 0   atorvastatin (LIPITOR) 80 MG tablet, TAKE 1 TABLET BY MOUTH EVERY DAY, Disp: 90 tablet, Rfl: 1   azelastine (ASTELIN) 0.1 % nasal spray, Place 1 spray into both nostrils 2 (two) times daily. Use in each nostril as directed, Disp: 30 mL, Rfl: 12   celecoxib (CELEBREX) 200 MG capsule, Take 1 capsule (200 mg total) by mouth daily., Disp: 60 capsule, Rfl: 1   Coenzyme Q10 (COQ-10) 400 MG CAPS, Take 400 mg by mouth daily. Reported on 10/28/2015, Disp: , Rfl:    cyclobenzaprine (FLEXERIL) 5 MG tablet, Take 1 tablet (5 mg total) by mouth 3 (three) times daily as needed for muscle spasms., Disp: 30 tablet, Rfl: 1   esomeprazole (NEXIUM) 40 MG capsule, Take 1 capsule (40 mg total) by mouth daily., Disp: 90 capsule, Rfl: 0   lisinopril (ZESTRIL) 10 MG tablet, Take 10 mg by mouth daily., Disp: , Rfl:    lisinopril-hydrochlorothiazide (ZESTORETIC) 20-12.5 MG tablet, Take 1 tablet by mouth once daily, Disp: 90 tablet, Rfl: 1   morphine (MS CONTIN) 15 MG 12 hr tablet, Take 1 tablet (15 mg total) by mouth every 12 (twelve) hours. Fill when due. Not early., Disp: 60 tablet, Rfl: 0   naloxone (NARCAN) nasal spray 4 mg/0.1 mL, 1 spray in 1 nostril prn respiratory suppression, may repeat every 2-3 minutes til responsive, alternating nostrils until EMS arrives, Disp: 2 each, Rfl: 1   sildenafil (VIAGRA) 100 MG tablet, Take 100 mg by mouth daily as needed., Disp: ,  Rfl:    zaleplon (SONATA) 10 MG capsule, Take 1 capsule by mouth at bedtime for sleep, may repeat 1 capsule prn for mid nocturnal awakening with 3 hours left to sleep, Disp: 60 capsule, Rfl: 5   [START ON 11/30/2022] amphetamine-dextroamphetamine (ADDERALL) 20 MG tablet, Take 1 tablet (20 mg total) by mouth 3 (three) times daily., Disp: 90 tablet, Rfl: 0   [START ON 12/30/2022] amphetamine-dextroamphetamine (ADDERALL) 20 MG tablet, Take 1 tablet (20 mg total) by mouth 3 (three)  times daily., Disp: 90 tablet, Rfl: 0   traZODone (DESYREL) 100 MG tablet, Take 0.5-2 tablets (50-200 mg total) by mouth at bedtime as needed for sleep., Disp: 180 tablet, Rfl: 1 Medication Side Effects: none  Family Medical/ Social History: Changes? No  MENTAL HEALTH EXAM:  There were no vitals taken for this visit.There is no height or weight on file to calculate BMI.  General Appearance: Casual, Neat and Well Groomed  Eye Contact:  Good  Speech:  Clear and Coherent and Normal Rate  Volume:  Normal  Mood:  Euthymic  Affect:  Appropriate  Thought Process:  Goal Directed and Descriptions of Associations: Circumstantial  Orientation:  Full (Time, Place, and Person)  Thought Content: Logical   Suicidal Thoughts:  No  Homicidal Thoughts:  No  Memory:  WNL  Judgement:  Good  Insight:  Good  Psychomotor Activity:  Normal  Concentration:  Concentration: Good and Attention Span: Good  Recall:  Good  Fund of Knowledge: Good  Language: Good  Assets:  Desire for Improvement Financial Resources/Insurance Housing Resilience Transportation  ADL's:  Intact  Cognition: WNL  Prognosis:  Good   DIAGNOSES:    ICD-10-CM   1. Attention deficit hyperactivity disorder (ADHD), combined type  F90.2     2. Recurrent major depression in full remission (HCC)  F33.42     3. Insomnia, unspecified type  G47.00      Receiving Psychotherapy: No   RECOMMENDATIONS:  PDMP was reviewed.  Last Adderall filled 16 2024.  Last Sonata filled 10/02/2022.  Morphine known to me. I provided 22 minutes of face to face time during this encounter, including time spent before and after the visit in records review, medical decision making, counseling pertinent to today's visit, and charting.   Gregory Carr is doing well with current meds and no changes need to be made.  Continue Adderall 20 mg, 1 p.o. 3 times daily. Continue trazodone 100 mg, 1/2-2 p.o. nightly as needed sleep. Continue Sonata 10 mg, 1 p.o. nightly as  needed.  May repeat 1 as needed for mid nocturnal awakening as long as he has 3 hours left to sleep. Return in 6 months.  Melony Overly, PA-C

## 2022-10-31 ENCOUNTER — Other Ambulatory Visit: Payer: Self-pay | Admitting: Family Medicine

## 2022-10-31 DIAGNOSIS — G8929 Other chronic pain: Secondary | ICD-10-CM

## 2022-10-31 DIAGNOSIS — M545 Low back pain, unspecified: Secondary | ICD-10-CM

## 2022-10-31 DIAGNOSIS — M25551 Pain in right hip: Secondary | ICD-10-CM

## 2022-11-02 ENCOUNTER — Other Ambulatory Visit: Payer: Federal, State, Local not specified - PPO

## 2022-11-02 ENCOUNTER — Other Ambulatory Visit: Payer: Self-pay | Admitting: Family Medicine

## 2022-11-02 ENCOUNTER — Other Ambulatory Visit: Payer: Self-pay

## 2022-11-02 DIAGNOSIS — Z79899 Other long term (current) drug therapy: Secondary | ICD-10-CM

## 2022-11-02 DIAGNOSIS — G8929 Other chronic pain: Secondary | ICD-10-CM

## 2022-11-02 DIAGNOSIS — E7439 Other disorders of intestinal carbohydrate absorption: Secondary | ICD-10-CM

## 2022-11-02 DIAGNOSIS — M25551 Pain in right hip: Secondary | ICD-10-CM

## 2022-11-02 DIAGNOSIS — E782 Mixed hyperlipidemia: Secondary | ICD-10-CM

## 2022-11-02 DIAGNOSIS — M545 Low back pain, unspecified: Secondary | ICD-10-CM

## 2022-11-02 DIAGNOSIS — I10 Essential (primary) hypertension: Secondary | ICD-10-CM

## 2022-11-02 NOTE — Telephone Encounter (Signed)
Called pt lvm needing him to call regarding lab work and  Getting scheduled for lab appt.

## 2022-11-02 NOTE — Telephone Encounter (Signed)
Prescription Request  11/02/2022  Is this a "Controlled Substance" medicine? No  LOV: 08/01/2022  What is the name of the medication or equipment?morphine (MS CONTIN) 15 MG 12 hr tablet [[id number]]   Have you contacted your pharmacy to request a refill? Yes   Which pharmacy would you like this sent to?  [facility] [address], [place] - [address] [place] [address] Phone: [phone] Fax: [phone]    Patient notified that their request is being sent to the clinical staff for review and that they should receive a response within 2 business days.   Please advise at Mobile [phone] (mobile)

## 2022-11-03 ENCOUNTER — Other Ambulatory Visit: Payer: Federal, State, Local not specified - PPO

## 2022-11-03 DIAGNOSIS — E7439 Other disorders of intestinal carbohydrate absorption: Secondary | ICD-10-CM

## 2022-11-03 DIAGNOSIS — E782 Mixed hyperlipidemia: Secondary | ICD-10-CM

## 2022-11-03 DIAGNOSIS — M545 Low back pain, unspecified: Secondary | ICD-10-CM

## 2022-11-03 DIAGNOSIS — M25551 Pain in right hip: Secondary | ICD-10-CM

## 2022-11-03 DIAGNOSIS — I10 Essential (primary) hypertension: Secondary | ICD-10-CM

## 2022-11-03 DIAGNOSIS — Z79899 Other long term (current) drug therapy: Secondary | ICD-10-CM

## 2022-11-03 LAB — CBC WITH DIFFERENTIAL/PLATELET
Eosinophils Absolute: 43 cells/uL (ref 15–500)
HCT: 41.8 % (ref 38.5–50.0)
Hemoglobin: 14.3 g/dL (ref 13.2–17.1)
Lymphs Abs: 836 cells/uL — ABNORMAL LOW (ref 850–3900)
MCH: 31.6 pg (ref 27.0–33.0)
MCHC: 34.2 g/dL (ref 32.0–36.0)
Neutro Abs: 4703 cells/uL (ref 1500–7800)
Neutrophils Relative %: 77.1 %
RDW: 12.4 % (ref 11.0–15.0)
Total Lymphocyte: 13.7 %
WBC: 6.1 10*3/uL (ref 3.8–10.8)

## 2022-11-03 NOTE — Telephone Encounter (Signed)
Pt came into lab today for UDS and contract , wants to know if medication can be sent today since he is running out.

## 2022-11-03 NOTE — Addendum Note (Signed)
Addended by: Maximino Sarin on: 11/03/2022 03:19 PM   Modules accepted: Orders

## 2022-11-04 ENCOUNTER — Other Ambulatory Visit: Payer: Self-pay | Admitting: Family Medicine

## 2022-11-04 DIAGNOSIS — M25551 Pain in right hip: Secondary | ICD-10-CM

## 2022-11-04 DIAGNOSIS — G8929 Other chronic pain: Secondary | ICD-10-CM

## 2022-11-04 LAB — LIPID PANEL
Cholesterol: 157 mg/dL (ref <200)
HDL: 58 mg/dL (ref >=40)
LDL Cholesterol (Calc): 76 mg/dL (calc)
Non-HDL Cholesterol (Calc): 99 mg/dL (calc) (ref <130)
Total CHOL/HDL Ratio: 2.7 (calc) (ref <5.0)
Triglycerides: 142 mg/dL (ref <150)

## 2022-11-04 LAB — CBC WITH DIFFERENTIAL/PLATELET
Absolute Monocytes: 500 cells/uL (ref 200–950)
Basophils Absolute: 18 cells/uL (ref 0–200)
Basophils Relative: 0.3 %
Eosinophils Relative: 0.7 %
MCV: 92.3 fL (ref 80.0–100.0)
MPV: 10.6 fL (ref 7.5–12.5)
Monocytes Relative: 8.2 %
Platelets: 179 10*3/uL (ref 140–400)
RBC: 4.53 10*6/uL (ref 4.20–5.80)

## 2022-11-04 LAB — COMPREHENSIVE METABOLIC PANEL
AG Ratio: 2 (calc) (ref 1.0–2.5)
ALT: 23 U/L (ref 9–46)
AST: 20 U/L (ref 10–35)
Albumin: 4.3 g/dL (ref 3.6–5.1)
Alkaline phosphatase (APISO): 76 U/L (ref 35–144)
BUN: 21 mg/dL (ref 7–25)
CO2: 27 mmol/L (ref 20–32)
Calcium: 9.6 mg/dL (ref 8.6–10.3)
Chloride: 101 mmol/L (ref 98–110)
Creat: 1.02 mg/dL (ref 0.70–1.28)
Globulin: 2.2 g/dL (calc) (ref 1.9–3.7)
Glucose, Bld: 118 mg/dL — ABNORMAL HIGH (ref 65–99)
Potassium: 3.9 mmol/L (ref 3.5–5.3)
Sodium: 139 mmol/L (ref 135–146)
Total Bilirubin: 1.3 mg/dL — ABNORMAL HIGH (ref 0.2–1.2)
Total Protein: 6.5 g/dL (ref 6.1–8.1)

## 2022-11-04 LAB — TSH: TSH: 0.92 mIU/L (ref 0.40–4.50)

## 2022-11-04 LAB — HEMOGLOBIN A1C
Hgb A1c MFr Bld: 5.8 % of total Hgb — ABNORMAL HIGH (ref <5.7)
Mean Plasma Glucose: 120 mg/dL
eAG (mmol/L): 6.6 mmol/L

## 2022-11-05 LAB — DRUG MONITORING PANEL 375977 , URINE
Alcohol Metabolites: NEGATIVE ng/mL (ref <500)
Amphetamine: 1271 ng/mL — ABNORMAL HIGH (ref <250)
Amphetamines: POSITIVE ng/mL — AB (ref <500)
Barbiturates: NEGATIVE ng/mL (ref <300)
Benzodiazepines: NEGATIVE ng/mL (ref <100)
Benzoylecgonine: 296 ng/mL — ABNORMAL HIGH (ref <100)
Cocaine Metabolite: POSITIVE ng/mL — AB (ref <150)
Codeine: NEGATIVE ng/mL (ref <50)
Desmethyltramadol: NEGATIVE ng/mL (ref <100)
Hydrocodone: NEGATIVE ng/mL (ref <50)
Hydromorphone: NEGATIVE ng/mL (ref <50)
Marijuana Metabolite: NEGATIVE ng/mL (ref <20)
Methamphetamine: NEGATIVE ng/mL (ref <250)
Morphine: 349 ng/mL — ABNORMAL HIGH (ref <50)
Norhydrocodone: NEGATIVE ng/mL (ref <50)
Opiates: POSITIVE ng/mL — AB (ref <100)
Oxycodone: NEGATIVE ng/mL (ref <100)
Tramadol: NEGATIVE ng/mL (ref <100)

## 2022-11-05 LAB — DM TEMPLATE

## 2022-11-05 MED ORDER — MORPHINE SULFATE ER 15 MG PO TBCR
15.0000 mg | EXTENDED_RELEASE_TABLET | Freq: Two times a day (BID) | ORAL | 0 refills | Status: DC
Start: 2022-11-05 — End: 2023-05-01

## 2022-11-06 ENCOUNTER — Telehealth: Payer: Self-pay | Admitting: Family Medicine

## 2022-11-06 ENCOUNTER — Telehealth: Payer: Self-pay

## 2022-11-06 ENCOUNTER — Ambulatory Visit: Payer: Federal, State, Local not specified - PPO | Admitting: Sports Medicine

## 2022-11-06 MED ORDER — MORPHINE SULFATE ER 15 MG PO TBCR
15.0000 mg | EXTENDED_RELEASE_TABLET | Freq: Two times a day (BID) | ORAL | 0 refills | Status: DC
Start: 2022-11-06 — End: 2023-05-01

## 2022-11-06 NOTE — Telephone Encounter (Signed)
Pt states he received a call regarding the denial of his medication, stated in the call he was told this was because he tested for positive for cocaine. He states he is not sure how that happened as he does not "smoke it". Shamaine made aware and advised he will look into this an f/u with pt.

## 2022-11-06 NOTE — Telephone Encounter (Signed)
Called pt regarding feeling sick and  Not been able the urinate. Pt stated he was feeling better  Now he got his medication refill.

## 2022-11-06 NOTE — Telephone Encounter (Signed)
Initial Comment Caller states he is very sick, and hasn't had his medication refilled. Caller states he has a headache, can't urinate and feels like he has the flu. Translation No Nurse Assessment Nurse: Vear Clock, RN, Elease Hashimoto Date/Time Lamount Cohen Time): 11/04/2022 7:50:52 AM Please select the assessment type ---Refill Does the patient have enough medication to last until the office opens? ---No Nurse: Vear Clock, RN, Elease Hashimoto Date/Time (Eastern Time): 11/04/2022 7:49:21 AM Confirm and document reason for call. If symptomatic, describe symptoms. ---He is very sick for the last couple of days. He is having a headache, unable to urinate and he feels like he has the flu. FH feels warm. Does the patient have any new or worsening symptoms? ---Yes Will a triage be completed? ---Yes Related visit to physician within the last 2 weeks? ---No Does the PT have any chronic conditions? (i.e. diabetes, asthma, this includes High risk factors for pregnancy, etc.) ---Yes List chronic conditions. ---chronic pain Is this a behavioral health or substance abuse call? ---No Guidelines Guideline Title Affirmed Question Affirmed Notes Nurse Date/Time Lamount Cohen Time) Substance Use and Problems Patient sounds very anxious or agitated Emiliano Dyer 11/04/2022 7:53:54 AM PLEASE NOTE: All timestamps contained within this report are represented as [place] Standard Time. CONFIDENTIALTY NOTICE: This fax transmission is intended only for the addressee. It contains information that is legally privileged, confidential or otherwise protected from use or disclosure. If you are not the intended recipient, you are strictly prohibited from reviewing, disclosing, copying using or disseminating any of this information or taking any action in reliance on or regarding this information. If you have received this fax in error, please notify us immediately by telephone so that we can arrange for its return to [place]. Phone:  [phone], Toll-Free: [phone], Fax: [phone] Page: 2 of 2 Call Id: [id number] Disp. Time Lamount Cohen Time) Disposition Final User 11/04/2022 7:47:46 AM Send to Urgent Queue Lorrin Goodell 11/04/2022 7:56:11 AM Go to ED Now (or PCP triage) Yes Vear Clock, RN, Elease Hashimoto Final Disposition 11/04/2022 7:56:11 AM Go to ED Now (or PCP triage) Yes Vear Clock, RN, Ancil Boozer Disagree/Comply Disagree Caller Understands Yes PreDisposition InappropriateToAsk Care Advice Given Per Guideline GO TO ED NOW (OR PCP TRIAGE): BRING MEDICINES: ANOTHER ADULT SHOULD DRIVE: * It is better and safer if another adult drives instead of you. CARE ADVICE given per Substance Use and Problems (Adult) guideline. * IF PCP SECONDLEVEL TRIAGE REQUIRED: You may need to be seen. Your doctor (or NP/PA) will want to talk with you to decide what's best. I'll page the provider on-call now. If you haven't heard from the provider (or me) within 30 minutes, go directly to the ED/UCC at _____________ Hospital. Comments User: Aviva Kluver, RN Date/Time Lamount Cohen Time): 11/04/2022 7:58:38 AM The caller is very upset he cannot get his meds today. Referrals GO TO FACILITY REFUSED

## 2022-11-06 NOTE — Telephone Encounter (Signed)
Kathi Ludwig, [place]      11/06/22  8:17 AM Note Called pt regarding feeling sick and  Not been able the urinate. Pt stated he was feeling better  Now he got his medication refill.

## 2022-11-07 ENCOUNTER — Other Ambulatory Visit: Payer: Self-pay | Admitting: Family Medicine

## 2022-11-07 DIAGNOSIS — M545 Low back pain, unspecified: Secondary | ICD-10-CM

## 2022-11-07 DIAGNOSIS — G8929 Other chronic pain: Secondary | ICD-10-CM

## 2022-11-07 NOTE — Telephone Encounter (Signed)
Called pt was advised and Pt would like a referral to  Pain management.

## 2022-11-10 ENCOUNTER — Ambulatory Visit: Payer: Federal, State, Local not specified - PPO | Admitting: Family

## 2022-11-24 ENCOUNTER — Encounter: Payer: Self-pay | Admitting: Physical Medicine & Rehabilitation

## 2023-01-02 ENCOUNTER — Encounter
Payer: Federal, State, Local not specified - PPO | Attending: Physical Medicine & Rehabilitation | Admitting: Physical Medicine & Rehabilitation

## 2023-01-08 ENCOUNTER — Other Ambulatory Visit: Payer: Self-pay | Admitting: Family Medicine

## 2023-01-28 NOTE — Assessment & Plan Note (Deleted)
hgba1c acceptable, minimize simple carbs. Increase exercise as tolerated.  

## 2023-01-28 NOTE — Assessment & Plan Note (Deleted)
Encouraged good sleep hygiene such as dark, quiet room. No blue/green glowing lights such as computer screens in bedroom. No alcohol or stimulants in evening. Cut down on caffeine as able. Regular exercise is helpful but not just prior to bed time. He is encouraged to decrease Sonata to 10 mg and then try and stop. Will work with psychiatry.

## 2023-01-28 NOTE — Assessment & Plan Note (Deleted)
Hydrate and monitor 

## 2023-01-28 NOTE — Assessment & Plan Note (Deleted)
Well controlled, no changes to meds. Encouraged heart healthy diet such as the DASH diet and exercise as tolerated.  °

## 2023-01-28 NOTE — Assessment & Plan Note (Deleted)
Patient encouraged to maintain heart healthy diet, regular exercise, adequate sleep. Consider daily probiotics. Take medications as prescribed. Labs ordered and reviewed. Colonoscopy 2021 with [facility] f/u with them as advised. Given and reviewed copy of ACP documents from [employer] and encouraged to complete and return.  Consider Shingrix is the new shingles shot, 2 shots over 2-6 months, confirm coverage with insurance and document, then can return here for shots with nurse appt or at pharmacy and flu shot

## 2023-01-28 NOTE — Assessment & Plan Note (Deleted)
Tolerating statin, encouraged heart healthy diet, avoid trans fats, minimize simple carbs and saturated fats. Increase exercise as tolerated 

## 2023-01-29 ENCOUNTER — Other Ambulatory Visit: Payer: Self-pay

## 2023-01-29 ENCOUNTER — Encounter: Payer: Federal, State, Local not specified - PPO | Admitting: Family Medicine

## 2023-01-29 ENCOUNTER — Telehealth: Payer: Self-pay | Admitting: Physician Assistant

## 2023-01-29 DIAGNOSIS — G4700 Insomnia, unspecified: Secondary | ICD-10-CM

## 2023-01-29 DIAGNOSIS — I10 Essential (primary) hypertension: Secondary | ICD-10-CM

## 2023-01-29 DIAGNOSIS — E782 Mixed hyperlipidemia: Secondary | ICD-10-CM

## 2023-01-29 DIAGNOSIS — E7439 Other disorders of intestinal carbohydrate absorption: Secondary | ICD-10-CM

## 2023-01-29 DIAGNOSIS — Z0000 Encounter for general adult medical examination without abnormal findings: Secondary | ICD-10-CM

## 2023-01-29 MED ORDER — AMPHETAMINE-DEXTROAMPHETAMINE 20 MG PO TABS: 20.0000 mg | ORAL_TABLET | Freq: Three times a day (TID) | ORAL | 0 refills | Status: DC

## 2023-01-29 NOTE — Telephone Encounter (Signed)
Pended.

## 2023-01-29 NOTE — Telephone Encounter (Signed)
Pt called asking for a refill on his adderall 20 mg. Pharmacy is [facility] [address]

## 2023-02-02 ENCOUNTER — Encounter: Payer: Federal, State, Local not specified - PPO | Admitting: Physical Medicine & Rehabilitation

## 2023-02-14 ENCOUNTER — Other Ambulatory Visit: Payer: Self-pay

## 2023-02-14 ENCOUNTER — Telehealth: Payer: Self-pay | Admitting: Physician Assistant

## 2023-02-14 MED ORDER — ZALEPLON 10 MG PO CAPS: ORAL_CAPSULE | ORAL | 1 refills | Status: DC

## 2023-02-14 NOTE — Telephone Encounter (Signed)
Pt called asking for a refill on his  zaleplon. Pharmacy is [facility] [address]

## 2023-02-14 NOTE — Telephone Encounter (Signed)
Sent!

## 2023-02-24 ENCOUNTER — Other Ambulatory Visit: Payer: Self-pay | Admitting: Family Medicine

## 2023-03-02 ENCOUNTER — Other Ambulatory Visit: Payer: Self-pay | Admitting: Family Medicine

## 2023-03-02 ENCOUNTER — Telehealth: Payer: Self-pay | Admitting: Physician Assistant

## 2023-03-02 ENCOUNTER — Other Ambulatory Visit: Payer: Self-pay

## 2023-03-02 DIAGNOSIS — I10 Essential (primary) hypertension: Secondary | ICD-10-CM

## 2023-03-02 MED ORDER — AMPHETAMINE-DEXTROAMPHETAMINE 20 MG PO TABS: 20.0000 mg | ORAL_TABLET | Freq: Three times a day (TID) | ORAL | 0 refills | Status: DC

## 2023-03-02 NOTE — Telephone Encounter (Signed)
Pended.

## 2023-03-02 NOTE — Telephone Encounter (Signed)
Next appt is 04/13/23. Requesting refill on Adderall 20 mg called to:  [id number] [address], [place] - [address]   Phone: [phone]  Fax: [phone]    Per patient it is in stock, he checked.

## 2023-03-07 ENCOUNTER — Other Ambulatory Visit: Payer: Self-pay | Admitting: Family Medicine

## 2023-03-07 DIAGNOSIS — I10 Essential (primary) hypertension: Secondary | ICD-10-CM

## 2023-03-25 ENCOUNTER — Other Ambulatory Visit: Payer: Self-pay | Admitting: Family Medicine

## 2023-03-25 DIAGNOSIS — I10 Essential (primary) hypertension: Secondary | ICD-10-CM

## 2023-03-26 ENCOUNTER — Telehealth: Payer: Self-pay | Admitting: Physician Assistant

## 2023-03-26 NOTE — Telephone Encounter (Signed)
Next appt is 04/13/23. Requesting refill on Zestoretic  called to:  [facility] [address] [place] - [address]   Phone: [phone]  Fax: [phone]

## 2023-03-26 NOTE — Telephone Encounter (Signed)
Patient received 11/08 and has medication no need for refill

## 2023-03-29 ENCOUNTER — Telehealth: Payer: Self-pay | Admitting: Physician Assistant

## 2023-03-29 ENCOUNTER — Other Ambulatory Visit: Payer: Self-pay

## 2023-03-29 NOTE — Telephone Encounter (Signed)
Patient called today at 2:56 requesting a refill on the Zaleplon. He stated he is out of refills. Fill at [facility] [address] [place] - [address] [place] [address] Phone: [phone]  Fax: [phone]

## 2023-03-29 NOTE — Telephone Encounter (Signed)
Rx pended for Sonata to [facility].

## 2023-03-30 MED ORDER — ZALEPLON 10 MG PO CAPS: ORAL_CAPSULE | ORAL | 1 refills | Status: DC

## 2023-04-03 ENCOUNTER — Encounter: Payer: Federal, State, Local not specified - PPO | Admitting: Family

## 2023-04-08 ENCOUNTER — Other Ambulatory Visit: Payer: Self-pay | Admitting: Physician Assistant

## 2023-04-09 ENCOUNTER — Other Ambulatory Visit: Payer: Self-pay | Admitting: Family Medicine

## 2023-04-11 ENCOUNTER — Other Ambulatory Visit: Payer: Self-pay | Admitting: Family Medicine

## 2023-04-13 ENCOUNTER — Ambulatory Visit: Payer: Federal, State, Local not specified - PPO | Admitting: Physician Assistant

## 2023-04-13 ENCOUNTER — Encounter: Payer: Self-pay | Admitting: Physician Assistant

## 2023-04-13 DIAGNOSIS — F3342 Major depressive disorder, recurrent, in full remission: Secondary | ICD-10-CM | POA: Diagnosis not present

## 2023-04-13 DIAGNOSIS — F902 Attention-deficit hyperactivity disorder, combined type: Secondary | ICD-10-CM | POA: Diagnosis not present

## 2023-04-13 DIAGNOSIS — G4700 Insomnia, unspecified: Secondary | ICD-10-CM

## 2023-04-13 MED ORDER — AMPHETAMINE-DEXTROAMPHETAMINE 20 MG PO TABS: 20.0000 mg | ORAL_TABLET | Freq: Three times a day (TID) | ORAL | 0 refills | Status: DC

## 2023-04-13 MED ORDER — TRAZODONE HCL 100 MG PO TABS: 200.0000 mg | ORAL_TABLET | Freq: Every evening | ORAL | 1 refills | Status: DC | PRN

## 2023-04-13 MED ORDER — ZALEPLON 10 MG PO CAPS: ORAL_CAPSULE | ORAL | 5 refills | Status: DC

## 2023-04-13 NOTE — Progress Notes (Unsigned)
[facility]  Patient ID: Gregory Carr,  MRN: [id number]  PCP: Bradd Canary, MD  Date of Evaluation: 04/13/2023 Time spent:25 minutes  Chief Complaint:  Chief Complaint   ADHD; Insomnia; Follow-up    HISTORY/CURRENT STATUS: HPI routine 81-month med check.  Gregory Carr is still doing well with current meds. States that attention is good without easy distractibility.  Able to focus on things and finish tasks to completion.   Patient is able to enjoy things.  Energy and motivation are good.   No extreme sadness, tearfulness, or feelings of hopelessness.  Sleeps well with the Sonata and trazodone prn.  ADLs and personal hygiene are normal.  Memory is normal.  Appetite has not changed.  Weight is stable.  No c/o anxiety. Denies suicidal or homicidal thoughts.  Patient denies increased energy with decreased need for sleep, increased talkativeness, racing thoughts, impulsivity or risky behaviors, increased spending, increased libido, grandiosity, increased irritability or anger, paranoia, or hallucinations.  Denies dizziness, syncope, seizures, numbness, tingling, tremor, tics, unsteady gait, slurred speech, confusion. Denies muscle or joint pain, stiffness, or dystonia.  Individual Medical History/ Review of Systems: Changes? :No    Past medications for mental health diagnoses include: Ambien, Belsomra, Remeron, Restoril, Serzone, Cymbalta, Trazodone, Effexor XR, Sonata.   Allergies: Patient has no known allergies.  Current Medications:  Current Outpatient Medications:    alfuzosin (UROXATRAL) 10 MG 24 hr tablet, Take 10 mg by mouth daily., Disp: , Rfl:    atorvastatin (LIPITOR) 80 MG tablet, Take 1 tablet (80 mg total) by mouth daily., Disp: 90 tablet, Rfl: 1   azelastine (ASTELIN) 0.1 % nasal spray, Place 1 spray into both nostrils 2 (two) times daily. Use in each nostril as directed, Disp: 30 mL, Rfl: 12   celecoxib (CELEBREX) 200 MG capsule, Take 1 capsule (200 mg  total) by mouth daily., Disp: 60 capsule, Rfl: 1   Coenzyme Q10 (COQ-10) 400 MG CAPS, Take 400 mg by mouth daily. Reported on 10/28/2015, Disp: , Rfl:    cyclobenzaprine (FLEXERIL) 5 MG tablet, Take 1 tablet (5 mg total) by mouth 3 (three) times daily as needed for muscle spasms., Disp: 30 tablet, Rfl: 1   esomeprazole (NEXIUM) 40 MG capsule, Take 1 capsule by mouth once daily, Disp: 90 capsule, Rfl: 0   lisinopril (ZESTRIL) 10 MG tablet, Take 10 mg by mouth daily., Disp: , Rfl:    lisinopril-hydrochlorothiazide (ZESTORETIC) 20-12.5 MG tablet, Take 1 tablet by mouth once daily, Disp: 90 tablet, Rfl: 0   oxyCODONE-acetaminophen (PERCOCET) 10-325 MG tablet, Take 1 tablet by mouth 3 (three) times daily as needed., Disp: , Rfl:    sildenafil (VIAGRA) 100 MG tablet, Take 100 mg by mouth daily as needed., Disp: , Rfl:    [START ON 05/02/2023] amphetamine-dextroamphetamine (ADDERALL) 20 MG tablet, Take 1 tablet (20 mg total) by mouth 3 (three) times daily., Disp: 90 tablet, Rfl: 0   [START ON 06/01/2023] amphetamine-dextroamphetamine (ADDERALL) 20 MG tablet, Take 1 tablet (20 mg total) by mouth 3 (three) times daily., Disp: 90 tablet, Rfl: 0   [START ON 04/30/2023] amphetamine-dextroamphetamine (ADDERALL) 20 MG tablet, Take 1 tablet (20 mg total) by mouth 3 (three) times daily., Disp: 90 tablet, Rfl: 0   morphine (MS CONTIN) 15 MG 12 hr tablet, Take 1 tablet (15 mg total) by mouth every 12 (twelve) hours. Fill when due. Not early. (Patient not taking: Reported on 04/13/2023), Disp: 30 tablet, Rfl: 0   morphine (MS CONTIN) 15 MG 12 hr tablet,  Take 1 tablet (15 mg total) by mouth every 12 (twelve) hours. Fill when due. Not early. (Patient not taking: Reported on 04/13/2023), Disp: 60 tablet, Rfl: 0   naloxone (NARCAN) nasal spray 4 mg/0.1 mL, 1 spray in 1 nostril prn respiratory suppression, may repeat every 2-3 minutes til responsive, alternating nostrils until EMS arrives (Patient not taking: Reported on 04/13/2023),  Disp: 2 each, Rfl: 1   traZODone (DESYREL) 100 MG tablet, Take 2 tablets (200 mg total) by mouth at bedtime as needed for sleep., Disp: 180 tablet, Rfl: 1   zaleplon (SONATA) 10 MG capsule, Take 1 capsule by mouth at bedtime for sleep, may repeat 1 capsule prn for mid nocturnal awakening with 3 hours left to sleep, Disp: 60 capsule, Rfl: 5 Medication Side Effects: none  Family Medical/ Social History: Changes? No  MENTAL HEALTH EXAM:  There were no vitals taken for this visit.There is no height or weight on file to calculate BMI.  General Appearance: Casual, Neat and Well Groomed  Eye Contact:  Good  Speech:  Clear and Coherent and Normal Rate  Volume:  Normal  Mood:  Euthymic  Affect:  Appropriate  Thought Process:  Goal Directed and Descriptions of Associations: Circumstantial  Orientation:  Full (Time, Place, and Person)  Thought Content: Logical   Suicidal Thoughts:  No  Homicidal Thoughts:  No  Memory:  WNL  Judgement:  Good  Insight:  Good  Psychomotor Activity:  Normal  Concentration:  Concentration: Good and Attention Span: Good  Recall:  Good  Fund of Knowledge: Good  Language: Good  Assets:  Communication Skills Desire for Improvement Financial Resources/Insurance Housing Resilience Transportation  ADL's:  Intact  Cognition: WNL  Prognosis:  Good   DIAGNOSES:    ICD-10-CM   1. Attention deficit hyperactivity disorder (ADHD), combined type  F90.2     2. Recurrent major depression in full remission (HCC)  F33.42     3. Insomnia, unspecified type  G47.00      Receiving Psychotherapy: No   RECOMMENDATIONS:  PDMP was reviewed.  Last Adderall filled 04/02/2023.  Sonata filled 03/30/2023. I provided 25 minutes of face to face time during this encounter, including time spent before and after the visit in records review, medical decision making, counseling pertinent to today's visit, and charting.   He is doing well so no changes needed.  Continue Adderall 20  mg, 1 p.o. 3 times daily. Continue trazodone 100 mg, 1/2-2 p.o. nightly as needed sleep. Continue Sonata 10 mg, 1 p.o. nightly as needed.  May repeat 1 as needed for mid nocturnal awakening as long as he has 3 hours left to sleep. Return in 6 months.  Melony Overly, PA-C

## 2023-04-19 NOTE — Telephone Encounter (Signed)
Copied from CRM [id number]. Topic: Clinical - Medication Question >> Apr 19, 2023 12:37 PM Tiffany H wrote: Reason for CRM: Patient called to follow up Nexium prescription. [facility] advised that insurance will only pay for 90 days of this medication a year. [facility] suggested switching to [facility] if patient's acid reflux is responsive to it. Patient advised he's fine with that. If not, [facility] will add a GoodRx coupon code to bring the price of Nexium down to $40. Please call patient by EOB if possible. Patient advised that acid reflux kept him up last night.

## 2023-04-20 ENCOUNTER — Ambulatory Visit: Payer: Federal, State, Local not specified - PPO | Admitting: Physician Assistant

## 2023-05-01 ENCOUNTER — Telehealth: Payer: Self-pay | Admitting: Family

## 2023-05-01 ENCOUNTER — Ambulatory Visit (INDEPENDENT_AMBULATORY_CARE_PROVIDER_SITE_OTHER): Payer: Federal, State, Local not specified - PPO | Admitting: Family

## 2023-05-01 VITALS — BP 119/67 | HR 81 | Temp 98.4°F | Resp 16 | Ht 65.0 in | Wt 167.0 lb

## 2023-05-01 DIAGNOSIS — I10 Essential (primary) hypertension: Secondary | ICD-10-CM

## 2023-05-01 DIAGNOSIS — M545 Low back pain, unspecified: Secondary | ICD-10-CM

## 2023-05-01 DIAGNOSIS — E782 Mixed hyperlipidemia: Secondary | ICD-10-CM | POA: Diagnosis not present

## 2023-05-01 DIAGNOSIS — K2270 Barrett's esophagus without dysplasia: Secondary | ICD-10-CM

## 2023-05-01 DIAGNOSIS — F902 Attention-deficit hyperactivity disorder, combined type: Secondary | ICD-10-CM | POA: Diagnosis not present

## 2023-05-01 DIAGNOSIS — G8929 Other chronic pain: Secondary | ICD-10-CM

## 2023-05-01 DIAGNOSIS — E7439 Other disorders of intestinal carbohydrate absorption: Secondary | ICD-10-CM

## 2023-05-01 DIAGNOSIS — Z23 Encounter for immunization: Secondary | ICD-10-CM

## 2023-05-01 DIAGNOSIS — Z0000 Encounter for general adult medical examination without abnormal findings: Secondary | ICD-10-CM

## 2023-05-01 DIAGNOSIS — N400 Enlarged prostate without lower urinary tract symptoms: Secondary | ICD-10-CM

## 2023-05-01 DIAGNOSIS — C642 Malignant neoplasm of left kidney, except renal pelvis: Secondary | ICD-10-CM

## 2023-05-01 LAB — COMPREHENSIVE METABOLIC PANEL
ALT: 25 U/L (ref 0–53)
AST: 24 U/L (ref 0–37)
Albumin: 4.4 g/dL (ref 3.5–5.2)
Alkaline Phosphatase: 70 U/L (ref 39–117)
BUN: 31 mg/dL — ABNORMAL HIGH (ref 6–23)
CO2: 29 meq/L (ref 19–32)
Calcium: 9.6 mg/dL (ref 8.4–10.5)
Chloride: 101 meq/L (ref 96–112)
Creatinine, Ser: 1.33 mg/dL (ref 0.40–1.50)
GFR: 53.96 mL/min — ABNORMAL LOW (ref >60.00)
Glucose, Bld: 95 mg/dL (ref 70–99)
Potassium: 4.1 meq/L (ref 3.5–5.1)
Sodium: 140 meq/L (ref 135–145)
Total Bilirubin: 1.1 mg/dL (ref 0.2–1.2)
Total Protein: 6.5 g/dL (ref 6.0–8.3)

## 2023-05-01 LAB — LIPID PANEL
Cholesterol: 164 mg/dL (ref 0–200)
HDL: 47.5 mg/dL (ref >39.00)
LDL Cholesterol: 71 mg/dL (ref 0–99)
NonHDL: 116.21
Total CHOL/HDL Ratio: 3
Triglycerides: 225 mg/dL — ABNORMAL HIGH (ref 0.0–149.0)
VLDL: 45 mg/dL — ABNORMAL HIGH (ref 0.0–40.0)

## 2023-05-01 LAB — HEMOGLOBIN A1C: Hgb A1c MFr Bld: 5.8 % (ref 4.6–6.5)

## 2023-05-01 MED ORDER — LISINOPRIL-HYDROCHLOROTHIAZIDE 20-12.5 MG PO TABS: 1.5000 | ORAL_TABLET | Freq: Every day | ORAL | Status: DC

## 2023-05-01 MED ORDER — TAMSULOSIN HCL 0.4 MG PO CAPS: 0.4000 mg | ORAL_CAPSULE | Freq: Every day | ORAL | Status: DC

## 2023-05-01 NOTE — Assessment & Plan Note (Signed)
 At goal, continues zestoretic but taking 1.5 tabs daily.

## 2023-05-01 NOTE — Assessment & Plan Note (Signed)
-  Administer first dose of Shingles vaccine today. Schedule second dose in three months. -Recommend annual eye exam. -Recommend COVID-19 booster shot at local pharmacy. -Check blood sugar, kidney function, and cholesterol levels today. -continue healthy diet, exercise

## 2023-05-01 NOTE — Telephone Encounter (Signed)
 Please request a copy of most recent colonoscopy from [facility].

## 2023-05-01 NOTE — Assessment & Plan Note (Signed)
 Stable- on flomax per urology.

## 2023-05-01 NOTE — Assessment & Plan Note (Signed)
 Followed with Pain Management and maintained on oxycodone.

## 2023-05-01 NOTE — Assessment & Plan Note (Signed)
 Maintained on nexium- followed by GI at [facility]- Dr. Kathy Breach.

## 2023-05-01 NOTE — Patient Instructions (Signed)
 VISIT SUMMARY:  During your visit today, we reviewed your ongoing health concerns, including prostate enlargement, chronic pain, Barrett's esophagus, and general health maintenance. We discussed your current medications and any side effects you are experiencing. We also addressed your immunization status and recommended some routine health checks.  YOUR PLAN:  -BENIGN PROSTATIC HYPERPLASIA: Benign Prostatic Hyperplasia is a non-cancerous enlargement of the prostate gland that can cause difficulty urinating. You should continue taking Tamsulosin  as prescribed to help manage this condition.  -CHRONIC PAIN: Chronic pain is long-lasting pain that can be managed with medications and other treatments. You should continue your current pain management plan with your specialist, including the use of Oxycodone.  -BARRETT'S ESOPHAGUS: Barrett's Esophagus is a condition where the lining of the esophagus is damaged by stomach acid. You should continue taking Nexium  as prescribed to manage this condition.  -GENERAL HEALTH MAINTENANCE: For your general health, we administered the first dose of the Shingles vaccine today and scheduled the second dose in three months. We recommend you get an annual eye exam and a COVID-19 booster shot at your local pharmacy. We also checked your blood sugar, kidney function, and cholesterol levels today. Please follow up in six months for a blood pressure check.  INSTRUCTIONS:  Please continue your follow-up with Urology for your history of kidney cancer and elevated PSA levels. If your hearing loss becomes more bothersome, consider getting a referral for a hearing test.

## 2023-05-01 NOTE — Assessment & Plan Note (Signed)
 Stable on adderrall which is being rx'd by Psychiatry.

## 2023-05-01 NOTE — Assessment & Plan Note (Signed)
 He sees Urology at [facility].

## 2023-05-01 NOTE — Progress Notes (Signed)
 Subjective:     Patient ID: Gregory Carr, male    DOB: 1950-10-16, 73 y.o.   MRN: [id number]  Chief Complaint  Patient presents with   Hypertension    Here for follow up    Hypertension Pertinent negatives include no blurred vision.    Discussed the use of AI scribe software for clinical note transcription with the patient, who gave verbal consent to proceed.  History of Present Illness   The patient, with a history of kidney cancer, back pain, Barrett's esophagus, hypertension, hyperlipidemia and prostate enlargement, presents for a routine physical examination. The patient reports difficulty urinating, which he attributes to prostate enlargement. He is currently taking Tamsulosin  for this issue, but notes that it is not as effective as it used to be. He follows with Urology for this issue. The patient also mentions occasional constipation, which he believes is a side effect of his oxycodone medication. The patient also reports some perceived hearing loss, but is not currently interested in pursuing a formal hearing test or hearing aids. He lives alone and does not believe his hearing loss is significantly impacting his daily life. The patient is up to date on most of his immunizations, but has not yet received his shingles vaccine or updated covid booster. He had a case of shingles in the past and is aware that he is at risk for another episode.      Immunizations: due for covid booster Diet: needs improvement Exercise: enjoys jogging Colonoscopy: 2021,   Vision: due  Dental: dentures  Health Maintenance Due  Topic Date Due   Zoster Vaccines- Shingrix  (1 of 2) Never done   COVID-19 Vaccine (2 - Pfizer risk series) 08/26/2020    Past Medical History:  Diagnosis Date   Abdominal pain, left lower quadrant 01/17/2015   BACK PAIN, CHRONIC 11/11/2007   Qualifier: Diagnosis of  By: Tanda MD, Jay HAMMANS ESOPHAGUS 11/11/2007   Qualifier: Diagnosis of  By: Tanda MD,  LauraLee     BPH (benign prostatic hyperplasia) 09/13/2012   BPH (benign prostatic hyperplasia) 09/13/2012   Follows with [facility]    ERECTILE DYSFUNCTION 08/16/2006   Qualifier: Diagnosis of  By: Tita MD, Luis     GERD 08/16/2006   Qualifier: Diagnosis of  By: Tita MD, Sheree NOSS SYNDROME 01/22/2008   Qualifier: Diagnosis of  By: Tanda MD, LauraLee     GLUCOSE INTOLERANCE 12/11/2007   Qualifier: Diagnosis of  By: Tanda MD, LauraLee      Grief reaction 11/07/2015   HTN (hypertension) 03/24/2011   HYPERLIPIDEMIA 08/16/2006   Qualifier: Diagnosis of  By: Tita MD, Luis     Onychomycosis 09/13/2012   Other anxiety states 04/08/2007   Qualifier: Diagnosis of  By: Tita MD, Luis     Preventative health care 09/29/2013   Renal cell carcinoma (HCC) 12/15/2011   S/p left nephrectomy Follows with [facility]    Right hip pain 07/21/2016    Past Surgical History:  Procedure Laterality Date   CERVICAL SPINE SURGERY     PARTIAL NEPHRECTOMY Left    around 2003    Family History  Problem Relation Age of Onset   Dementia Mother    Cancer Daughter        brain cancer, glioblastoma   Hypertension Maternal Grandfather    Heart disease Maternal Grandfather        MI   Stroke Paternal Grandfather    Arthritis Sister  Social History   Socioeconomic History   Marital status: Widowed    Spouse name: Not on file   Number of children: Not on file   Years of education: Not on file   Highest education level: Not on file  Occupational History   Not on file  Tobacco Use   Smoking status: Former    Current packs/day: 0.00    Types: Cigarettes    Quit date: 08/23/1975    Years since quitting: 47.7   Smokeless tobacco: Never  Substance and Sexual Activity   Alcohol use: Yes    Alcohol/week: 1.0 - 2.0 standard drink of alcohol    Types: 1 - 2 Cans of beer per week   Drug use: No   Sexual activity: Yes    Comment: lives with wife, no dietary restrictions,  regular exercise  Other Topics Concern   Not on file  Social History Narrative   Jewish   Lives alone (has a turtle and a bird)   Retired- patent attorney for the [employer]   Enjoys running, gym   Social Drivers of Corporate Investment Banker Strain: Not on file  Food Insecurity: Not on file  Transportation Needs: Not on file  Physical Activity: Not on file  Stress: Not on file  Social Connections: Not on file  Intimate Partner Violence: Not on file    Outpatient Medications Prior to Visit  Medication Sig Dispense Refill   alfuzosin (UROXATRAL) 10 MG 24 hr tablet Take 10 mg by mouth daily.     [START ON 05/02/2023] amphetamine -dextroamphetamine  (ADDERALL) 20 MG tablet Take 1 tablet (20 mg total) by mouth 3 (three) times daily. 90 tablet 0   [START ON 06/01/2023] amphetamine -dextroamphetamine  (ADDERALL) 20 MG tablet Take 1 tablet (20 mg total) by mouth 3 (three) times daily. 90 tablet 0   amphetamine -dextroamphetamine  (ADDERALL) 20 MG tablet Take 1 tablet (20 mg total) by mouth 3 (three) times daily. 90 tablet 0   atorvastatin  (LIPITOR) 80 MG tablet Take 1 tablet (80 mg total) by mouth daily. 90 tablet 1   azelastine  (ASTELIN ) 0.1 % nasal spray Place 1 spray into both nostrils 2 (two) times daily. Use in each nostril as directed 30 mL 12   celecoxib  (CELEBREX ) 200 MG capsule Take 1 capsule (200 mg total) by mouth daily. 60 capsule 1   Coenzyme Q10 (COQ-10) 400 MG CAPS Take 400 mg by mouth daily. Reported on 10/28/2015     cyclobenzaprine  (FLEXERIL ) 5 MG tablet Take 1 tablet (5 mg total) by mouth 3 (three) times daily as needed for muscle spasms. 30 tablet 1   esomeprazole  (NEXIUM ) 40 MG capsule Take 1 capsule by mouth once daily 90 capsule 0   oxyCODONE-acetaminophen (PERCOCET) 10-325 MG tablet Take 1 tablet by mouth 3 (three) times daily as needed.     sildenafil  (VIAGRA ) 100 MG tablet Take 100 mg by mouth daily as needed.     traZODone  (DESYREL ) 100 MG tablet Take 2  tablets (200 mg total) by mouth at bedtime as needed for sleep. 180 tablet 1   zaleplon  (SONATA ) 10 MG capsule Take 1 capsule by mouth at bedtime for sleep, may repeat 1 capsule prn for mid nocturnal awakening with 3 hours left to sleep 60 capsule 5   lisinopril  (ZESTRIL ) 10 MG tablet Take 10 mg by mouth daily.     lisinopril -hydrochlorothiazide  (ZESTORETIC ) 20-12.5 MG tablet Take 1 tablet by mouth once daily 90 tablet 0   morphine  (MS CONTIN ) 15 MG  12 hr tablet Take 1 tablet (15 mg total) by mouth every 12 (twelve) hours. Fill when due. Not early. 30 tablet 0   morphine  (MS CONTIN ) 15 MG 12 hr tablet Take 1 tablet (15 mg total) by mouth every 12 (twelve) hours. Fill when due. Not early. 60 tablet 0   naloxone  (NARCAN ) nasal spray 4 mg/0.1 mL 1 spray in 1 nostril prn respiratory suppression, may repeat every 2-3 minutes til responsive, alternating nostrils until EMS arrives 2 each 1   No facility-administered medications prior to visit.    No Known Allergies  Review of Systems  HENT:  Positive for hearing loss. Negative for congestion.   Eyes:  Negative for blurred vision.  Respiratory:  Negative for cough.   Cardiovascular:  Negative for leg swelling.  Gastrointestinal:  Positive for constipation (mild). Negative for diarrhea.  Genitourinary:  Negative for dysuria and frequency.  Skin:  Negative for rash.       Objective:    Physical Exam   BP 119/67 (BP Location: Right Arm, Patient Position: Sitting, Cuff Size: Small)   Pulse 81   Temp 98.4 F (36.9 C) (Oral)   Resp 16   Ht 5' 5 (1.651 m)   Wt 167 lb (75.8 kg)   SpO2 98%   BMI 27.79 kg/m  Wt Readings from Last 3 Encounters:  05/01/23 167 lb (75.8 kg)  08/01/22 164 lb (74.4 kg)  05/03/22 166 lb (75.3 kg)  Physical Exam  Constitutional: He is oriented to person, place, and time. He appears well-developed and well-nourished. No distress.  HENT:  Head: Normocephalic and atraumatic.  Right Ear: Tympanic membrane and ear  canal normal.  Left Ear: Tympanic membrane and ear canal normal.  Mouth/Throat: Oropharynx is clear and moist.  Eyes: Pupils are equal, round, and reactive to light. No scleral icterus.  Neck: Normal range of motion. No thyromegaly present.  Cardiovascular: Normal rate and regular rhythm.   No murmur heard. Pulmonary/Chest: Effort normal and breath sounds normal. No respiratory distress. He has no wheezes. He has no rales. He exhibits no tenderness.  Abdominal: Soft. Bowel sounds are normal. He exhibits no distension and no mass. There is no tenderness. There is no rebound and no guarding.  Musculoskeletal: He exhibits no edema.  Lymphadenopathy:    He has no cervical adenopathy.  Neurological: He is alert and oriented to person, place, and time. He has normal patellar reflexes. He exhibits normal muscle tone. Coordination normal.  Skin: Skin is warm and dry.  Psychiatric: He has a normal mood and affect. His behavior is normal. Judgment and thought content normal.           Assessment & Plan:        Assessment & Plan:   Problem List Items Addressed This Visit       Unprioritized   Renal cell carcinoma (HCC) - Primary   He sees [facility] at [facility].       Preventative health care    -Administer first dose of Shingles vaccine today. Schedule second dose in three months. -Recommend annual eye exam. -Recommend COVID-19 booster shot at local pharmacy. -Check blood sugar, kidney function, and cholesterol levels today. -continue healthy diet, exercise      LOW BACK PAIN   Followed with Pain Management and maintained on oxycodone.       Hyperlipidemia   Lab Results  Component Value Date   CHOL 157 11/03/2022   HDL 58 11/03/2022   LDLCALC 76 11/03/2022  LDLDIRECT 82.0 07/31/2014   TRIG 142 11/03/2022   CHOLHDL 2.7 11/03/2022    Maintained on atorvastatin .       Relevant Medications   lisinopril -hydrochlorothiazide  (ZESTORETIC ) 20-12.5 MG tablet   Other  Relevant Orders   Comp Met (CMET)   Lipid panel   Glucose intolerance   Relevant Orders   HgB A1c   Essential hypertension   At goal, continues zestoretic  but taking 1.5 tabs daily.       Relevant Medications   lisinopril -hydrochlorothiazide  (ZESTORETIC ) 20-12.5 MG tablet   BPH (benign prostatic hyperplasia)   Stable- on flomax  per urology.       Relevant Medications   tamsulosin  (FLOMAX ) 0.4 MG CAPS capsule   BARRETTS ESOPHAGUS   Maintained on nexium - followed by GI at [facility]- Dr. Renold.       Attention deficit hyperactivity disorder (ADHD)   Stable on adderrall which is being rx'd by Psychiatry.        I have discontinued Juandavid Dallman. Patron Steve's naloxone , lisinopril , morphine , and morphine . I have also changed his lisinopril -hydrochlorothiazide . Additionally, I am having him start on tamsulosin . Lastly, I am having him maintain his CoQ-10, alfuzosin, azelastine , cyclobenzaprine , celecoxib , sildenafil , atorvastatin , esomeprazole , oxyCODONE-acetaminophen, amphetamine -dextroamphetamine , amphetamine -dextroamphetamine , amphetamine -dextroamphetamine , traZODone , and zaleplon .  Meds ordered this encounter  Medications   lisinopril -hydrochlorothiazide  (ZESTORETIC ) 20-12.5 MG tablet    Sig: Take 1.5 tablets by mouth daily.    Dispense:  135 tablet    Refill:  01    Supervising Provider:   DOMENICA BLACKBIRD A [[id number]]   tamsulosin  (FLOMAX ) 0.4 MG CAPS capsule    Sig: Take 1 capsule (0.4 mg total) by mouth daily.    Supervising Provider:   DOMENICA BLACKBIRD A [phone]

## 2023-05-01 NOTE — Assessment & Plan Note (Addendum)
 Lab Results  Component Value Date   CHOL 157 11/03/2022   HDL 58 11/03/2022   LDLCALC 76 11/03/2022   LDLDIRECT 82.0 07/31/2014   TRIG 142 11/03/2022   CHOLHDL 2.7 11/03/2022    Maintained on atorvastatin.

## 2023-05-01 NOTE — Telephone Encounter (Signed)
 Electronic request sent for most recent colonoscopy

## 2023-05-05 ENCOUNTER — Other Ambulatory Visit: Payer: Self-pay | Admitting: Physician Assistant

## 2023-05-11 ENCOUNTER — Telehealth: Payer: Self-pay | Admitting: Physician Assistant

## 2023-05-11 ENCOUNTER — Other Ambulatory Visit: Payer: Self-pay

## 2023-05-11 NOTE — Telephone Encounter (Signed)
 Pharmacy is currently closed. Will call back.

## 2023-05-11 NOTE — Telephone Encounter (Signed)
Pt called at 1:28p stating that [facility] on [address] told him he needs a PA for Zaleplon.  Next appt 6/20

## 2023-05-14 NOTE — Telephone Encounter (Signed)
Spoke with pharmacy they stated that max qty is 60 per yr.

## 2023-05-14 NOTE — Telephone Encounter (Signed)
Called pharmacy. They are closed until 9 am.

## 2023-05-16 NOTE — Telephone Encounter (Signed)
Noted will review PA

## 2023-05-18 ENCOUNTER — Telehealth: Payer: Self-pay

## 2023-05-18 NOTE — Telephone Encounter (Signed)
Prior Authorization Zaleplon 10 mg #60/30 [employer] FEP  Approved Effective:  04/18/23-05/17/24

## 2023-05-18 NOTE — Telephone Encounter (Signed)
Prior Approval received effective through 05/17/24 with [employer] for Zaleplon 10 mg capsules #180 for 90 day

## 2023-05-29 ENCOUNTER — Telehealth: Payer: Self-pay

## 2023-05-29 NOTE — Telephone Encounter (Signed)
Prior Authorization Amphetamine-Dextroamphetamine 20MG  tablets #90/30 [employer]  Approved Effective:  04/29/2023 through 05/28/2024

## 2023-06-02 ENCOUNTER — Other Ambulatory Visit: Payer: Self-pay | Admitting: Family Medicine

## 2023-06-02 DIAGNOSIS — I10 Essential (primary) hypertension: Secondary | ICD-10-CM

## 2023-06-15 ENCOUNTER — Other Ambulatory Visit: Payer: Self-pay | Admitting: Family Medicine

## 2023-06-18 ENCOUNTER — Other Ambulatory Visit: Payer: Self-pay | Admitting: Physician Assistant

## 2023-06-18 ENCOUNTER — Telehealth: Payer: Self-pay | Admitting: Physician Assistant

## 2023-06-18 MED ORDER — AMPHETAMINE-DEXTROAMPHETAMINE 20 MG PO TABS: 20.0000 mg | ORAL_TABLET | Freq: Three times a day (TID) | ORAL | 0 refills | Status: DC

## 2023-06-18 NOTE — Telephone Encounter (Signed)
 Pt called and said that his pharmacy doesn't have 20 mg or 30 mg of adderall in stock. He doesn;t know what to do. He would like a different medicine sent in. Please call him at [phone]

## 2023-06-18 NOTE — Telephone Encounter (Signed)
 LVM to RC. Pt needs to locate medication at a different pharmacy. Shortages with all medications.

## 2023-06-18 NOTE — Telephone Encounter (Signed)
 Pt called back at 4:10.  He said he has spoken to 2 other people and given them the info. [facility] [address]  Pls send it in before it's not available. He is very concerned about that.  Next appt 6/20

## 2023-07-06 ENCOUNTER — Telehealth: Payer: Self-pay | Admitting: Physician Assistant

## 2023-07-06 NOTE — Telephone Encounter (Signed)
 Next visit is 10/12/23. Requesting refill on Trazodone 100 mg called to:  [id number] - [place], [place] - [address]   Phone: [phone]  Fax: [phone]

## 2023-07-06 NOTE — Telephone Encounter (Signed)
 Pt was trying to fill with an old Rx.  Confirmed with pharmacy that he does have RF and asked them to fill. They ran it and it went thru. Notified patient.

## 2023-07-13 ENCOUNTER — Other Ambulatory Visit: Payer: Self-pay | Admitting: Family Medicine

## 2023-07-18 ENCOUNTER — Other Ambulatory Visit (HOSPITAL_COMMUNITY): Payer: Self-pay

## 2023-07-18 ENCOUNTER — Telehealth: Payer: Self-pay | Admitting: Pharmacy Technician

## 2023-07-18 NOTE — Telephone Encounter (Signed)
 Prior Authorization form/request asks a question that requires your assistance. Please see the question below and advise accordingly. The PA will not be submitted until the necessary information is received.

## 2023-07-19 ENCOUNTER — Other Ambulatory Visit (HOSPITAL_COMMUNITY): Payer: Self-pay

## 2023-07-19 NOTE — Telephone Encounter (Signed)
 Pharmacy Patient Advocate Encounter   Received notification from CoverMyMeds that prior authorization for Esomeprazole Magnesium 40MG  dr capsules is required/requested.   Insurance verification completed.   The patient is insured through [facility] .   Per test claim: PA required and submitted KEY/EOC/Request #: [id number] status pending

## 2023-07-19 NOTE — Telephone Encounter (Signed)
 Pharmacy Patient Advocate Encounter  Received notification from [facility] that Prior Authorization for Esomeprazole Magnesium 40MG  dr capsules has been APPROVED from 06/19/23 to 07/18/24. Ran test claim, Copay is $20.98. This test claim was processed through [facility]- copay amounts may vary at other pharmacies due to pharmacy/plan contracts, or as the patient moves through the different stages of their insurance plan.   PA #/Case ID/Reference #: [id number]

## 2023-07-31 ENCOUNTER — Ambulatory Visit: Payer: Federal, State, Local not specified - PPO

## 2023-07-31 ENCOUNTER — Ambulatory Visit (INDEPENDENT_AMBULATORY_CARE_PROVIDER_SITE_OTHER): Payer: Federal, State, Local not specified - PPO

## 2023-07-31 DIAGNOSIS — Z23 Encounter for immunization: Secondary | ICD-10-CM

## 2023-07-31 NOTE — Progress Notes (Signed)
 TION TSE is a 73 y.o. male presents to the office today for Shingrix #2 injection, per physician's orders. Original order: 05/01/2023 Shingrix 0.5 mL IM was administered L Deltoid today. Patient tolerated injection. Patient due for follow up labs/provider appt: appt pending for 10/29/2023 Patient next injection due: n/a  DOD: Almyra Brace, Feliberto Harts

## 2023-08-21 ENCOUNTER — Telehealth: Payer: Self-pay | Admitting: Physician Assistant

## 2023-08-21 ENCOUNTER — Other Ambulatory Visit: Payer: Self-pay

## 2023-08-21 MED ORDER — AMPHETAMINE-DEXTROAMPHETAMINE 20 MG PO TABS: 20.0000 mg | ORAL_TABLET | Freq: Three times a day (TID) | ORAL | 0 refills | Status: DC

## 2023-08-21 NOTE — Telephone Encounter (Signed)
 Gregory Carr called at 3:46 to request refill of his Adderall 20mg .  Appt 6/20.  [facility]  [id number] [place], [place] - [address]

## 2023-08-21 NOTE — Telephone Encounter (Signed)
 Pended [facility], [facility]90[facility]

## 2023-10-12 ENCOUNTER — Encounter: Payer: Self-pay | Admitting: Physician Assistant

## 2023-10-12 ENCOUNTER — Ambulatory Visit (INDEPENDENT_AMBULATORY_CARE_PROVIDER_SITE_OTHER): Payer: Federal, State, Local not specified - PPO | Admitting: Physician Assistant

## 2023-10-12 DIAGNOSIS — F3342 Major depressive disorder, recurrent, in full remission: Secondary | ICD-10-CM

## 2023-10-12 DIAGNOSIS — F902 Attention-deficit hyperactivity disorder, combined type: Secondary | ICD-10-CM | POA: Diagnosis not present

## 2023-10-12 DIAGNOSIS — G4700 Insomnia, unspecified: Secondary | ICD-10-CM | POA: Diagnosis not present

## 2023-10-12 MED ORDER — TRAZODONE HCL 100 MG PO TABS: 200.0000 mg | ORAL_TABLET | Freq: Every evening | ORAL | 1 refills | Status: DC | PRN

## 2023-10-12 MED ORDER — AMPHETAMINE-DEXTROAMPHETAMINE 20 MG PO TABS: 20.0000 mg | ORAL_TABLET | Freq: Three times a day (TID) | ORAL | 0 refills | Status: DC

## 2023-10-12 MED ORDER — ZALEPLON 10 MG PO CAPS: ORAL_CAPSULE | ORAL | 5 refills | Status: DC

## 2023-10-12 NOTE — Progress Notes (Signed)
 [facility] Med Check  Patient ID: TYTON ABDALLAH,  MRN: [id number]  PCP: Neda Balk, MD  Date of Evaluation: 10/12/2023 Time spent:20 minutes  Chief Complaint:  Chief Complaint   ADHD; Insomnia; Follow-up    HISTORY/CURRENT STATUS: HPI routine 63-month med check.  Siegfried Dress is doing well w/ current meds. States that attention is good without easy distractibility.  Able to focus on things and finish tasks to completion.  Energy and motivation are good for the most part.  No reports of sadness or feelings of hopelessness.  Sleeps well as long as he has the trazodone  and Sonata .Aaron Aas ADLs and personal hygiene are normal.   No memory issues.  Appetite has not changed.  Weight is stable.  No PA or generalized anxiety.  No mania, psychosis, or delirium.  No SI/HI.    Denies dizziness, syncope, seizures, numbness, tingling, tremor, tics, unsteady gait, slurred speech, confusion. Has chronic myalgias and back pain.   Individual Medical History/ Review of Systems: Changes? :No    Past medications for mental health diagnoses include: Ambien , Belsomra , Remeron, Restoril, Serzone, Cymbalta, Trazodone , Effexor  XR, Sonata .   Allergies: Patient has no known allergies.  Current Medications:  Current Outpatient Medications:    alfuzosin (UROXATRAL) 10 MG 24 hr tablet, Take 10 mg by mouth daily., Disp: , Rfl:    atorvastatin  (LIPITOR) 80 MG tablet, Take 1 tablet (80 mg total) by mouth daily., Disp: 90 tablet, Rfl: 1   Coenzyme Q10 (COQ-10) 400 MG CAPS, Take 400 mg by mouth daily. Reported on 10/28/2015, Disp: , Rfl:    esomeprazole  (NEXIUM ) 40 MG capsule, Take 1 capsule by mouth once daily, Disp: 90 capsule, Rfl: 2   lisinopril -hydrochlorothiazide  (ZESTORETIC ) 20-12.5 MG tablet, Take 1 tablet by mouth once daily, Disp: 90 tablet, Rfl: 0   oxyCODONE-acetaminophen (PERCOCET) 10-325 MG tablet, Take 1 tablet by mouth 3 (three) times daily as needed., Disp: , Rfl:    sildenafil  (VIAGRA ) 100 MG tablet,  Take 100 mg by mouth daily as needed., Disp: , Rfl:    [START ON 10/25/2023] amphetamine -dextroamphetamine  (ADDERALL) 20 MG tablet, Take 1 tablet (20 mg total) by mouth 3 (three) times daily., Disp: 90 tablet, Rfl: 0   [START ON 11/24/2023] amphetamine -dextroamphetamine  (ADDERALL) 20 MG tablet, Take 1 tablet (20 mg total) by mouth 3 (three) times daily., Disp: 90 tablet, Rfl: 0   [START ON 12/24/2023] amphetamine -dextroamphetamine  (ADDERALL) 20 MG tablet, Take 1 tablet (20 mg total) by mouth 3 (three) times daily., Disp: 90 tablet, Rfl: 0   azelastine  (ASTELIN ) 0.1 % nasal spray, Place 1 spray into both nostrils 2 (two) times daily. Use in each nostril as directed (Patient not taking: Reported on 10/12/2023), Disp: 30 mL, Rfl: 12   celecoxib  (CELEBREX ) 200 MG capsule, Take 1 capsule (200 mg total) by mouth daily. (Patient not taking: Reported on 10/12/2023), Disp: 60 capsule, Rfl: 1   cyclobenzaprine  (FLEXERIL ) 5 MG tablet, Take 1 tablet (5 mg total) by mouth 3 (three) times daily as needed for muscle spasms. (Patient not taking: Reported on 10/12/2023), Disp: 30 tablet, Rfl: 1   tamsulosin  (FLOMAX ) 0.4 MG CAPS capsule, Take 1 capsule (0.4 mg total) by mouth daily. (Patient not taking: Reported on 10/12/2023), Disp: , Rfl:    traZODone  (DESYREL ) 100 MG tablet, Take 2 tablets (200 mg total) by mouth at bedtime as needed for sleep., Disp: 180 tablet, Rfl: 1   zaleplon  (SONATA ) 10 MG capsule, 1 po at bedtime for sleep, and repeat 1 for midnocturnal awakening as  long as he has 3-4 hours left to sleep., Disp: 60 capsule, Rfl: 5 Medication Side Effects: none  Family Medical/ Social History: Changes? No  MENTAL HEALTH EXAM:  There were no vitals taken for this visit.There is no height or weight on file to calculate BMI.  General Appearance: Casual, Neat and Well Groomed  Eye Contact:  Good  Speech:  Clear and Coherent and Normal Rate  Volume:  Normal  Mood:  Euthymic  Affect:  Appropriate  Thought Process:   Goal Directed and Descriptions of Associations: Circumstantial  Orientation:  Full (Time, Place, and Person)  Thought Content: Logical   Suicidal Thoughts:  No  Homicidal Thoughts:  No  Memory:  WNL  Judgement:  Good  Insight:  Good  Psychomotor Activity:  Normal  Concentration:  Concentration: Good and Attention Span: Good  Recall:  Good  Fund of Knowledge: Good  Language: Good  Assets:  Communication Skills Desire for Improvement Financial Resources/Insurance Housing Resilience Transportation Vocational/Educational  ADL's:  Intact  Cognition: WNL  Prognosis:  Good   DIAGNOSES:    ICD-10-CM   1. Attention deficit hyperactivity disorder (ADHD), combined type  F90.2     2. Recurrent major depression in full remission (HCC)  F33.42     3. Insomnia, unspecified type  G47.00       Receiving Psychotherapy: No   RECOMMENDATIONS:  PDMP was reviewed.  Last Adderall filled 09/26/2023.  Sonata  filled 09/19/2023.  Oxycodone known to me. I provided  20 minutes of face to face time during this encounter, including time spent before and after the visit in records review, medical decision making, counseling pertinent to today's visit, and charting.   Siegfried Dress is doing well so no changes are needed.   Continue Adderall 20 mg, 1 p.o. 3 times daily. Continue trazodone  100 mg, 1/2-2 p.o. nightly as needed sleep. Continue Sonata  10 mg, 1 p.o. nightly as needed.  May repeat 1 as needed for mid nocturnal awakening as long as he has 3 hours left to sleep. (I changed sig on Rx b/c pharmacy questioned dosing, he knows to take it prn) Return in 6 months.  Marvia Slocumb, PA-C

## 2023-10-28 NOTE — Assessment & Plan Note (Deleted)
 Tolerating statin, encouraged heart healthy diet, avoid trans fats, minimize simple carbs and saturated fats. Increase exercise as tolerated

## 2023-10-28 NOTE — Assessment & Plan Note (Deleted)
 Adderall prescribed by psychiatry

## 2023-10-28 NOTE — Assessment & Plan Note (Deleted)
 hgba1c acceptable, minimize simple carbs. Increase exercise as tolerated.

## 2023-10-28 NOTE — Assessment & Plan Note (Deleted)
 Well controlled, no changes to meds. Encouraged heart healthy diet such as the DASH diet and exercise as tolerated.

## 2023-10-28 NOTE — Assessment & Plan Note (Deleted)
 Hydrate, check Magnesium , cm.

## 2023-10-29 ENCOUNTER — Ambulatory Visit: Payer: Federal, State, Local not specified - PPO | Admitting: Family Medicine

## 2023-10-29 DIAGNOSIS — F902 Attention-deficit hyperactivity disorder, combined type: Secondary | ICD-10-CM

## 2023-10-29 DIAGNOSIS — E7439 Other disorders of intestinal carbohydrate absorption: Secondary | ICD-10-CM

## 2023-10-29 DIAGNOSIS — R252 Cramp and spasm: Secondary | ICD-10-CM

## 2023-10-29 DIAGNOSIS — E782 Mixed hyperlipidemia: Secondary | ICD-10-CM

## 2023-10-29 DIAGNOSIS — I10 Essential (primary) hypertension: Secondary | ICD-10-CM

## 2023-11-13 ENCOUNTER — Other Ambulatory Visit: Payer: Self-pay | Admitting: Family

## 2023-11-13 ENCOUNTER — Other Ambulatory Visit: Payer: Self-pay | Admitting: Family Medicine

## 2023-11-13 DIAGNOSIS — I10 Essential (primary) hypertension: Secondary | ICD-10-CM

## 2023-11-13 NOTE — Telephone Encounter (Signed)
 Duplicate request

## 2023-11-13 NOTE — Telephone Encounter (Signed)
 Copied from CRM [id number]. Topic: Clinical - Medication Refill >> Nov 13, 2023 11:19 AM Rosina BIRCH wrote: Medication: lisinopril -hydrochlorothiazide  (ZESTORETIC ) 20-12.5 MG tablet  Has the patient contacted their pharmacy? No (Agent: If no, request that the patient contact the pharmacy for the refill. If patient does not wish to contact the pharmacy document the reason why and proceed with request.) (Agent: If yes, when and what did the pharmacy advise?)  This is the patient's preferred pharmacy:  [facility] [address] [place] - [address] [place] [address] Phone: [phone] Fax: [phone]  Is this the correct pharmacy for this prescription? Yes If no, delete pharmacy and type the correct one.   Has the prescription been filled recently? No  Is the patient out of the medication? Yes  Has the patient been seen for an appointment in the last year OR does the patient have an upcoming appointment? Yes  Can we respond through MyChart? Yes  Agent: Please be advised that Rx refills may take up to 3 business days. We ask that you follow-up with your pharmacy.

## 2023-12-09 ENCOUNTER — Other Ambulatory Visit: Payer: Self-pay | Admitting: Family Medicine

## 2023-12-09 DIAGNOSIS — I10 Essential (primary) hypertension: Secondary | ICD-10-CM

## 2023-12-10 ENCOUNTER — Encounter: Payer: Self-pay | Admitting: *Deleted

## 2024-01-19 ENCOUNTER — Other Ambulatory Visit: Payer: Self-pay | Admitting: Family Medicine

## 2024-01-19 DIAGNOSIS — I10 Essential (primary) hypertension: Secondary | ICD-10-CM

## 2024-01-29 ENCOUNTER — Other Ambulatory Visit: Payer: Self-pay

## 2024-01-29 ENCOUNTER — Telehealth: Payer: Self-pay | Admitting: Physician Assistant

## 2024-01-29 DIAGNOSIS — F902 Attention-deficit hyperactivity disorder, combined type: Secondary | ICD-10-CM

## 2024-01-29 MED ORDER — AMPHETAMINE-DEXTROAMPHETAMINE 20 MG PO TABS: 20.0000 mg | ORAL_TABLET | Freq: Three times a day (TID) | ORAL | 0 refills | Status: DC

## 2024-01-29 NOTE — Telephone Encounter (Signed)
 Pended

## 2024-01-29 NOTE — Telephone Encounter (Signed)
 Pt called asking us  to RF the next 3 months of Adderall 20mg . [facility] send to : [id number] [place], [place] - [address]  [address]

## 2024-02-14 ENCOUNTER — Telehealth: Payer: Self-pay | Admitting: Family Medicine

## 2024-02-14 DIAGNOSIS — I10 Essential (primary) hypertension: Secondary | ICD-10-CM

## 2024-02-14 NOTE — Telephone Encounter (Signed)
 Lvm for pt to call and schedule

## 2024-02-14 NOTE — Telephone Encounter (Signed)
 Pt overdue for appt. Can you call the Pt to schedule an appt please?

## 2024-02-20 ENCOUNTER — Ambulatory Visit: Admitting: Student

## 2024-02-20 NOTE — Assessment & Plan Note (Deleted)
 Stable on Nexium -followed by GI at [facility]-Dr. Renold

## 2024-02-20 NOTE — Assessment & Plan Note (Deleted)
 Tolerating Statin. Encourage heart healthy diet such as MIND or DASH diet, increase exercise, avoid trans fats, simple carbohydrates and processed foods, consider a krill or fish or flaxseed oil cap daily.

## 2024-02-20 NOTE — Assessment & Plan Note (Deleted)
 Chronic. Follows with pain management, maintained on oxycodone.

## 2024-02-20 NOTE — Assessment & Plan Note (Deleted)
 S/p left nephrectomy. Follows with [facility].

## 2024-02-20 NOTE — Assessment & Plan Note (Deleted)
 Well controlled, no changes to meds. Encouraged heart healthy diet such as the DASH diet and exercise as tolerated.

## 2024-02-20 NOTE — Progress Notes (Deleted)
 Subjective:     Patient ID: Gregory Carr, male    DOB: 1951-01-11, 73 y.o.   MRN: [id number]  No chief complaint on file.   HPI  Discussed the use of AI scribe software for clinical note transcription with the patient, who gave verbal consent to proceed.  HTN  Lisinopril -hydrochlorothiazide  20-12.5 daily;  HLD-atorvastatin  80 mg daily  GERD-Nexium  40 mg daily  BPH-tamsulosin  0.4 mg daily  ADD-follows with behavioral health- Davie Cooks, PA Adderall 20 mg 3 times daily  Patient denies fever, chills, SOB, CP, palpitations, dyspnea, edema, HA, vision changes, N/V/D, abdominal pain, urinary symptoms, rash, weight changes, and recent illness or hospitalizations.   History of Present Illness              Health Maintenance Due  Topic Date Due  . COVID-19 Vaccine (2 - Pfizer risk series) 08/26/2020  . Influenza Vaccine  11/23/2023    Past Medical History:  Diagnosis Date  . Abdominal pain, left lower quadrant 01/17/2015  . BACK PAIN, CHRONIC 11/11/2007   Qualifier: Diagnosis of  By: Tanda MD, Jay    . BARRETTS ESOPHAGUS 11/11/2007   Qualifier: Diagnosis of  By: Tanda MD, Jay    . BPH (benign prostatic hyperplasia) 09/13/2012  . BPH (benign prostatic hyperplasia) 09/13/2012   Follows with [facility]   . ERECTILE DYSFUNCTION 08/16/2006   Qualifier: Diagnosis of  By: Tita MD, Luis    . GERD 08/16/2006   Qualifier: Diagnosis of  By: Tita MD, Luis    . 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 GLUCOSE INTOLERANCE 12/11/2007   Qualifier: Diagnosis of  By: Tanda MD, Jay     . Grief reaction 11/07/2015  . HTN (hypertension) 03/24/2011  . HYPERLIPIDEMIA 08/16/2006   Qualifier: Diagnosis of  By: Tita MD, Luis    . Onychomycosis 09/13/2012  . Other anxiety states 04/08/2007   Qualifier: Diagnosis of  By: Tita MD, Luis    . Preventative health care 09/29/2013  . Renal cell carcinoma (HCC)  12/15/2011   S/p left nephrectomy Follows with [facility]   . Right hip pain 07/21/2016    Past Surgical History:  Procedure Laterality Date  . CERVICAL SPINE SURGERY    . PARTIAL NEPHRECTOMY Left    around 2003    Family History  Problem Relation Age of Onset  . Dementia Mother   . Cancer Daughter        brain cancer, glioblastoma  . Hypertension Maternal Grandfather   . Heart disease Maternal Grandfather        MI  . Stroke Paternal Grandfather   . Arthritis Sister     Social History   Socioeconomic History  . Marital status: Widowed    Spouse name: Not on file  . Number of children: Not on file  . Years of education: Not on file  . Highest education level: Not on file  Occupational History  . Not on file  Tobacco Use  . Smoking status: Former    Current packs/day: 0.00    Types: Cigarettes    Quit date: 08/23/1975    Years since quitting: 48.5  . Smokeless tobacco: Never  Substance and Sexual Activity  . Alcohol use: Yes    Alcohol/week: 1.0 - 2.0 standard drink of alcohol    Types: 1 - 2 Cans of beer per week  . Drug use: No  . Sexual activity: Yes  Comment: lives with wife, no dietary restrictions, regular exercise  Other Topics Concern  . 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 Within the past 12 months, you worried that your food would run out before you got money to buy more: Never true   . Within the past 12 months, the food you bought just didn't last and you didn't have money to get more. : Never true  Transportation Needs: No Transportation Needs (07/14/2023)   Received from [facility]   . In the past 12 months, has lack of  reliable transportation kept you from medical appointments, meetings, work or from getting things needed for daily living? : No  Physical Activity: Not on file  Stress: Not on file  Social Connections: Not on file  Intimate Partner Violence: Not on file    Outpatient Medications Prior to Visit  Medication Sig Dispense Refill  . alfuzosin (UROXATRAL) 10 MG 24 hr tablet Take 10 mg by mouth daily.    SABRA CASTLEMAN ON 03/26/2024] amphetamine -dextroamphetamine  (ADDERALL) 20 MG tablet Take 1 tablet (20 mg total) by mouth 3 (three) times daily. 90 tablet 0  . [START ON 02/27/2024] amphetamine -dextroamphetamine  (ADDERALL) 20 MG tablet Take 1 tablet (20 mg total) by mouth 3 (three) times daily. 90 tablet 0  . amphetamine -dextroamphetamine  (ADDERALL) 20 MG tablet Take 1 tablet (20 mg total) by mouth 3 (three) times daily. 90 tablet 0  . atorvastatin  (LIPITOR) 80 MG tablet Take 1 tablet (80 mg total) by mouth daily. 90 tablet 1  . azelastine  (ASTELIN ) 0.1 % nasal spray Place 1 spray into both nostrils 2 (two) times daily. Use in each nostril as directed (Patient not taking: Reported on 10/12/2023) 30 mL 12  . celecoxib  (CELEBREX ) 200 MG capsule Take 1 capsule (200 mg total) by mouth daily. (Patient not taking: Reported on 10/12/2023) 60 capsule 1  . Coenzyme Q10 (COQ-10) 400 MG CAPS Take 400 mg by mouth daily. Reported on 10/28/2015    . cyclobenzaprine  (FLEXERIL ) 5 MG tablet Take 1 tablet (5 mg total) by mouth 3 (three) times daily as needed for muscle spasms. (Patient not taking: Reported on 10/12/2023) 30 tablet 1  . esomeprazole  (NEXIUM ) 40 MG capsule Take 1 capsule by mouth once daily 90 capsule 2  . lisinopril -hydrochlorothiazide  (ZESTORETIC ) 20-12.5 MG tablet Take 1.5 tablets by mouth daily. Needs appt 45 tablet 0  . oxyCODONE-acetaminophen (PERCOCET) 10-325 MG tablet Take 1 tablet by mouth 3 (three) times daily as needed.    . sildenafil  (VIAGRA ) 100 MG tablet Take 100 mg by mouth daily as needed.    .  tamsulosin  (FLOMAX ) 0.4 MG CAPS capsule Take 1 capsule (0.4 mg total) by mouth daily. (Patient not taking: Reported on 10/12/2023)    . traZODone  (DESYREL ) 100 MG tablet Take 2 tablets (200 mg total) by mouth at bedtime as needed for sleep. 180 tablet 1  . zaleplon  (SONATA ) 10 MG capsule 1 po at bedtime for sleep, and repeat 1 for midnocturnal awakening as long as he has 3-4 hours left to sleep. 60 capsule 5   No facility-administered medications prior to visit.  No Known Allergies  ROS See HPI    Objective:    Physical Exam  General: No acute distress. Awake and conversant.  Eyes: Normal conjunctiva, anicteric. Round symmetric pupils.  ENT: Hearing grossly intact. No nasal discharge.   Respiratory: CTAB. Respirations are non-labored. No wheezing.  Skin: Warm. No rashes or ulcers.  Psych: Alert and oriented. Cooperative, Appropriate mood and affect, Normal judgment.  CV: RRR. No murmur. No lower extremity edema.  MSK: Normal ambulation. No clubbing or cyanosis.  Neuro:  CN II-XII grossly normal.     There were no vitals taken for this visit. Wt Readings from Last 3 Encounters:  05/01/23 167 lb (75.8 kg)  08/01/22 164 lb (74.4 kg)  05/03/22 166 lb (75.3 kg)       Assessment & Plan:   Problem List Items Addressed This Visit     BARRETTS ESOPHAGUS   Stable on Nexium -followed by GI at [facility]-Dr. Renold      Essential hypertension   Well controlled, no changes to meds. Encouraged heart healthy diet such as the DASH diet and exercise as tolerated.        Hyperlipidemia   Tolerating Statin. Encourage heart healthy diet such as MIND or DASH diet, increase exercise, avoid trans fats, simple carbohydrates and processed foods, consider a krill or fish or flaxseed oil cap daily.        LOW BACK PAIN   Chronic. Follows with pain management, maintained on oxycodone.      Renal cell carcinoma (HCC) - Primary   S/p left nephrectomy. Follows with [facility].           I am having Izell Labat. Clifm Kemps maintain his CoQ-10, alfuzosin, azelastine , cyclobenzaprine , celecoxib , sildenafil , oxyCODONE-acetaminophen, tamsulosin , atorvastatin , esomeprazole , zaleplon , traZODone , lisinopril -hydrochlorothiazide , amphetamine -dextroamphetamine , amphetamine -dextroamphetamine , and amphetamine -dextroamphetamine .  No orders of the defined types were placed in this encounter.

## 2024-03-05 ENCOUNTER — Ambulatory Visit: Admitting: Student

## 2024-03-05 ENCOUNTER — Encounter: Payer: Self-pay | Admitting: Student

## 2024-03-05 VITALS — BP 152/82 | HR 75 | Ht 65.0 in | Wt 167.2 lb

## 2024-03-05 DIAGNOSIS — M549 Dorsalgia, unspecified: Secondary | ICD-10-CM | POA: Diagnosis not present

## 2024-03-05 DIAGNOSIS — I10 Essential (primary) hypertension: Secondary | ICD-10-CM | POA: Diagnosis not present

## 2024-03-05 DIAGNOSIS — N400 Enlarged prostate without lower urinary tract symptoms: Secondary | ICD-10-CM

## 2024-03-05 DIAGNOSIS — G8929 Other chronic pain: Secondary | ICD-10-CM

## 2024-03-05 DIAGNOSIS — K219 Gastro-esophageal reflux disease without esophagitis: Secondary | ICD-10-CM

## 2024-03-05 MED ORDER — LISINOPRIL-HYDROCHLOROTHIAZIDE 20-12.5 MG PO TABS: 1.5000 | ORAL_TABLET | Freq: Every day | ORAL | 1 refills | Status: AC

## 2024-03-05 NOTE — Assessment & Plan Note (Signed)
 Stable on current medications. Avoid offending foods, start probiotics. Do not eat large meals in late evening and consider raising head of bed

## 2024-03-05 NOTE — Progress Notes (Signed)
Subjective:     Patient ID: Gregory Carr, male    DOB: [birth year], 73 y.o.   MRN: [id number]  No chief complaint on file.   HPI  Discussed the use of AI scribe software for clinical note transcription with the patient, who gave verbal consent to proceed.  Follows with Urology, Behavioral Health, Pain Management- [facility]  History of Present Illness Gregory Carr is a 73 year old male with hypertension and benign prostatic hyperplasia who presents for medication management and follow-up.  He takes Zestoretic  for HTN. He has bene out of his medicaiton, not taking BP at home. Requesting refills  He is on Nexium  and omeprazole for heartburn, which are effective when available.  He takes alfuzosin for benign prostatic hyperplasia and is awaiting assignment to a new urologist after his previous one retired. He has elevated PSA levels with a biopsy showing no malignancy.  He is under pain management for chronic middle and low back pain and takes Percocet, associated with [facility] for pain management.  Patient denies fever, chills, SOB, CP, palpitations, dyspnea, edema, HA, vision changes, N/V/D, abdominal pain, urinary symptoms, rash, weight changes, and recent illness or hospitalizations.   History of Present Illness           Whose teresa hurts   Health Maintenance Due  Topic Date Due   COVID-19 Vaccine (2 - Pfizer risk series) 08/26/2020   Influenza Vaccine  11/23/2023    Past Medical History:  Diagnosis Date   Abdominal pain, left lower quadrant 01/17/2015   BACK PAIN, CHRONIC 11/11/2007   Qualifier: Diagnosis of  By: Tanda MD, Jay HAMMANS ESOPHAGUS 11/11/2007   Qualifier: Diagnosis of  By: Tanda MD, LauraLee     BPH (benign prostatic hyperplasia) 09/13/2012   BPH (benign prostatic hyperplasia) 09/13/2012   Follows with [facility]    ERECTILE DYSFUNCTION 08/16/2006   Qualifier: Diagnosis of  By: Tita MD, Luis     GERD 08/16/2006    Qualifier: Diagnosis of  By: Tita MD, Sheree NOSS SYNDROME 01/22/2008   Qualifier: Diagnosis of  By: Tanda MD, LauraLee     GLUCOSE INTOLERANCE 12/11/2007   Qualifier: Diagnosis of  By: Tanda MD, LauraLee      Grief reaction 11/07/2015   HTN (hypertension) 03/24/2011   HYPERLIPIDEMIA 08/16/2006   Qualifier: Diagnosis of  By: Tita MD, Luis     Onychomycosis 09/13/2012   Other anxiety states 04/08/2007   Qualifier: Diagnosis of  By: Tita MD, Luis     Preventative health care 09/29/2013   Renal cell carcinoma (HCC) 12/15/2011   S/p left nephrectomy Follows with [facility]    Right hip pain 07/21/2016    Past Surgical History:  Procedure Laterality Date   CERVICAL SPINE SURGERY     PARTIAL NEPHRECTOMY Left    around 2003    Family History  Problem Relation Age of Onset   Dementia Mother    Cancer Daughter        brain cancer, glioblastoma   Hypertension Maternal Grandfather    Heart disease Maternal Grandfather        MI   Stroke Paternal Grandfather    Arthritis Sister     Social History   Socioeconomic History   Marital status: Widowed    Spouse name: Not on file   Number of children: Not on file   Years of education: Not on file   Highest education level: Not on  file  Occupational History   Not on file  Tobacco Use   Smoking status: Former    Current packs/day: 0.00    Types: Cigarettes    Quit date: 08/23/1975    Years since quitting: 48.5   Smokeless tobacco: Never  Substance and Sexual Activity   Alcohol use: Yes    Alcohol/week: 1.0 - 2.0 standard drink of alcohol    Types: 1 - 2 Cans of beer per week   Drug use: No   Sexual activity: Yes    Comment: lives with wife, no dietary restrictions, regular exercise  Other Topics Concern   Not on file  Social History Narrative   Jewish   Lives alone (has a turtle and a bird)   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 : Never true  Transportation Needs: No Transportation Needs (07/14/2023)   Received from [facility]    In the past 12 months, has lack of reliable transportation kept you from medical appointments, meetings, work or from getting things needed for daily living? : No  Physical Activity: Not on file  Stress: Not on file  Social Connections: Not on file  Intimate Partner Violence: Not on file    Outpatient Medications Prior to Visit  Medication Sig Dispense Refill   alfuzosin (UROXATRAL) 10 MG 24 hr tablet Take 10 mg by mouth daily.     [START ON 03/26/2024] amphetamine -dextroamphetamine  (ADDERALL) 20 MG tablet Take 1 tablet (20 mg total) by mouth 3 (three) times daily. 90 tablet 0   amphetamine -dextroamphetamine  (ADDERALL) 20 MG tablet Take 1 tablet (20 mg total) by mouth 3 (three) times daily. 90 tablet 0   amphetamine -dextroamphetamine  (ADDERALL) 20 MG tablet Take 1 tablet (20 mg total) by mouth 3 (three) times daily. 90 tablet 0   atorvastatin  (LIPITOR) 80 MG tablet Take 1 tablet (80 mg total) by mouth daily. 90 tablet 1   azelastine  (ASTELIN ) 0.1 % nasal spray Place 1 spray into both nostrils 2 (two) times daily. Use in each nostril as directed (Patient not taking: Reported on 10/12/2023) 30 mL 12   celecoxib  (CELEBREX ) 200 MG capsule Take 1 capsule (200 mg total) by mouth daily. (Patient not taking: Reported on 10/12/2023) 60 capsule 1   Coenzyme Q10 (COQ-10) 400 MG CAPS Take 400 mg by mouth daily. Reported on 10/28/2015     esomeprazole  (NEXIUM ) 40 MG capsule Take 1 capsule by mouth once daily 90 capsule 2    oxyCODONE-acetaminophen (PERCOCET) 10-325 MG tablet Take 1 tablet by mouth 3 (three) times daily as needed.     sildenafil  (VIAGRA ) 100 MG tablet Take 100 mg by mouth daily as needed.     traZODone  (DESYREL ) 100 MG tablet Take 2 tablets (200 mg total) by mouth at bedtime as needed for sleep. 180 tablet 1   zaleplon  (SONATA ) 10 MG capsule 1 po at bedtime for sleep, and repeat 1 for midnocturnal awakening as long as he has 3-4 hours left to sleep. 60  capsule 5   cyclobenzaprine  (FLEXERIL ) 5 MG tablet Take 1 tablet (5 mg total) by mouth 3 (three) times daily as needed for muscle spasms. (Patient not taking: Reported on 10/12/2023) 30 tablet 1   lisinopril -hydrochlorothiazide  (ZESTORETIC ) 20-12.5 MG tablet Take 1.5 tablets by mouth daily. Needs appt 45 tablet 0   tamsulosin  (FLOMAX ) 0.4 MG CAPS capsule Take 1 capsule (0.4 mg total) by mouth daily. (Patient not taking: Reported on 10/12/2023)     No facility-administered medications prior to visit.    No Known Allergies  ROS See HPI    Objective:    Physical Exam Vitals reviewed.  Constitutional:      General: He is not in acute distress.    Appearance: He is not toxic-appearing.  HENT:     Head: Normocephalic and atraumatic.  Eyes:     Pupils: Pupils are equal, round, and reactive to light.  Cardiovascular:     Rate and Rhythm: Normal rate and regular rhythm.     Pulses: Normal pulses.     Heart sounds: Normal heart sounds. No murmur heard. Pulmonary:     Effort: Pulmonary effort is normal. No respiratory distress.     Breath sounds: Normal breath sounds. No wheezing.  Musculoskeletal:        General: No swelling.     Cervical back: Neck supple.  Skin:    General: Skin is warm and dry.  Neurological:     General: No focal deficit present.     Mental Status: He is alert and oriented to person, place, and time.  Psychiatric:        Mood and Affect: Mood normal.        Behavior: Behavior normal.        Thought Content: Thought  content normal.        Judgment: Judgment normal.      BP (!) 152/82   Pulse 75   Ht 5' 5 (1.651 m)   Wt 167 lb 3.2 oz (75.8 kg)   SpO2 97%   BMI 27.82 kg/m  Wt Readings from Last 3 Encounters:  03/05/24 167 lb 3.2 oz (75.8 kg)  05/01/23 167 lb (75.8 kg)  08/01/22 164 lb (74.4 kg)       Assessment & Plan:   Problem List Items Addressed This Visit     Back pain - Primary   Chronic. Follows with pain management, [facility].       BPH (benign prostatic hyperplasia)   Stable on Alfuzosin per urology.       Essential hypertension   Rx refilled. Pt has been out of his medication, BP elevated today during OV. Advise to ensure refills of medications for BP control. Pt encouraged to take BP at home, limit salt in diet. RTC if BP >140/90.      Relevant Medications   lisinopril -hydrochlorothiazide  (ZESTORETIC ) 20-12.5 MG tablet   GERD (Chronic)   Stable on current medications. Avoid offending foods, start probiotics. Do not eat large meals in late evening and consider raising head of bed.        I have discontinued Brantlee Penn. Rundquist Steve's cyclobenzaprine  and tamsulosin . I have also changed his lisinopril -hydrochlorothiazide . Additionally, I am having him maintain his CoQ-10, alfuzosin, azelastine , celecoxib , sildenafil , oxyCODONE-acetaminophen, atorvastatin , esomeprazole , zaleplon , traZODone , amphetamine -dextroamphetamine , amphetamine -dextroamphetamine , and amphetamine -dextroamphetamine .  Meds ordered this encounter  Medications   lisinopril -hydrochlorothiazide  (ZESTORETIC ) 20-12.5 MG tablet    Sig: Take 1.5 tablets by mouth daily.    Dispense:  135 tablet    Refill:  1   

## 2024-03-05 NOTE — Assessment & Plan Note (Signed)
 Stable on Alfuzosin per urology.

## 2024-03-05 NOTE — Assessment & Plan Note (Addendum)
 Rx refilled. Pt has been out of his medication, BP elevated today during OV. Advise to ensure refills of medications for BP control. Pt encouraged to take BP at home, limit salt in diet. RTC if BP >140/90.

## 2024-03-05 NOTE — Assessment & Plan Note (Signed)
 Chronic. Follows with pain management, [facility].

## 2024-03-09 ENCOUNTER — Other Ambulatory Visit: Payer: Self-pay | Admitting: Family Medicine

## 2024-04-02 ENCOUNTER — Telehealth: Payer: Self-pay

## 2024-04-02 NOTE — Telephone Encounter (Signed)
 PA approved Amphetamine -Dextroamphetamine  20 mg #90/30 day [facility] 03/03/24-04/02/25

## 2024-04-11 ENCOUNTER — Ambulatory Visit (INDEPENDENT_AMBULATORY_CARE_PROVIDER_SITE_OTHER): Admitting: Physician Assistant

## 2024-04-11 ENCOUNTER — Encounter: Payer: Self-pay | Admitting: Physician Assistant

## 2024-04-11 DIAGNOSIS — G4700 Insomnia, unspecified: Secondary | ICD-10-CM

## 2024-04-11 DIAGNOSIS — F902 Attention-deficit hyperactivity disorder, combined type: Secondary | ICD-10-CM

## 2024-04-11 MED ORDER — TRAZODONE HCL 100 MG PO TABS: 200.0000 mg | ORAL_TABLET | Freq: Every evening | ORAL | 1 refills | Status: AC | PRN

## 2024-04-11 MED ORDER — AMPHETAMINE-DEXTROAMPHETAMINE 20 MG PO TABS: 20.0000 mg | ORAL_TABLET | Freq: Three times a day (TID) | ORAL | 0 refills | Status: AC

## 2024-04-11 MED ORDER — ZALEPLON 10 MG PO CAPS: ORAL_CAPSULE | ORAL | 5 refills | Status: AC

## 2024-04-11 NOTE — Progress Notes (Signed)
 "     [facility] Med Check  Patient ID: Gregory Carr,  MRN: [id number]  PCP: Domenica Harlene LABOR, MD  Date of Evaluation: 04/11/2024 Time spent:20 minutes  Chief Complaint:  Chief Complaint   ADHD; Insomnia; Follow-up     HISTORY/CURRENT STATUS: HPI routine 48-month med check.  He's doing well. Meds are still effective. He is able to enjoy things.  Energy and motivation are good.   No extreme sadness, tearfulness, or feelings of hopelessness.  Sleeps well most of the time.  Sonata  helps. ADLs and personal hygiene are normal.   Denies any changes in concentration, making decisions, or remembering things.  Appetite has not changed.  No mania, delirium, AH/VH.  No SI/HI.  Individual Medical History/ Review of Systems: Changes? :No    Past medications for mental health diagnoses include: Ambien , Belsomra , Remeron, Restoril, Serzone, Cymbalta, Trazodone , Effexor  XR, Sonata .   Allergies: Patient has no known allergies.  Current Medications:  Current Outpatient Medications:    alfuzosin (UROXATRAL) 10 MG 24 hr tablet, Take 10 mg by mouth daily., Disp: , Rfl:    atorvastatin  (LIPITOR) 80 MG tablet, Take 1 tablet (80 mg total) by mouth daily., Disp: 90 tablet, Rfl: 1   lisinopril -hydrochlorothiazide  (ZESTORETIC ) 20-12.5 MG tablet, Take 1.5 tablets by mouth daily., Disp: 135 tablet, Rfl: 1   sildenafil  (VIAGRA ) 100 MG tablet, Take 100 mg by mouth daily as needed., Disp: , Rfl:    [START ON 05/01/2024] amphetamine -dextroamphetamine  (ADDERALL) 20 MG tablet, Take 1 tablet (20 mg total) by mouth 3 (three) times daily., Disp: 90 tablet, Rfl: 0   [START ON 05/31/2024] amphetamine -dextroamphetamine  (ADDERALL) 20 MG tablet, Take 1 tablet (20 mg total) by mouth 3 (three) times daily., Disp: 90 tablet, Rfl: 0   [START ON 06/27/2024] amphetamine -dextroamphetamine  (ADDERALL) 20 MG tablet, Take 1 tablet (20 mg total) by mouth 3 (three) times daily., Disp: 90 tablet, Rfl: 0   azelastine  (ASTELIN ) 0.1 % nasal  spray, Place 1 spray into both nostrils 2 (two) times daily. Use in each nostril as directed (Patient not taking: Reported on 04/11/2024), Disp: 30 mL, Rfl: 12   celecoxib  (CELEBREX ) 200 MG capsule, Take 1 capsule (200 mg total) by mouth daily. (Patient not taking: Reported on 04/11/2024), Disp: 60 capsule, Rfl: 1   Coenzyme Q10 (COQ-10) 400 MG CAPS, Take 400 mg by mouth daily. Reported on 10/28/2015, Disp: , Rfl:    esomeprazole  (NEXIUM ) 40 MG capsule, Take 1 capsule (40 mg total) by mouth daily., Disp: 90 capsule, Rfl: 1   oxyCODONE-acetaminophen (PERCOCET) 10-325 MG tablet, Take 1 tablet by mouth 3 (three) times daily as needed. (Patient not taking: Reported on 04/11/2024), Disp: , Rfl:    traZODone  (DESYREL ) 100 MG tablet, Take 2 tablets (200 mg total) by mouth at bedtime as needed for sleep., Disp: 180 tablet, Rfl: 1   zaleplon  (SONATA ) 10 MG capsule, 1 po at bedtime for sleep, and repeat 1 for midnocturnal awakening as long as he has 3-4 hours left to sleep., Disp: 60 capsule, Rfl: 5 Medication Side Effects: none  Family Medical/ Social History: Changes? No  MENTAL HEALTH EXAM:  There were no vitals taken for this visit.There is no height or weight on file to calculate BMI.  General Appearance: Casual, Neat and Well Groomed  Eye Contact:  Good  Speech:  Clear and Coherent and Normal Rate  Volume:  Normal  Mood:  Euthymic  Affect:  Appropriate  Thought Process:  Goal Directed and Descriptions of Associations: Circumstantial  Orientation:  Full (Time, Place, and Person)  Thought Content: Logical   Suicidal Thoughts:  No  Homicidal Thoughts:  No  Memory:  WNL  Judgement:  Good  Insight:  Good  Psychomotor Activity:  Normal  Concentration:  Concentration: Good and Attention Span: Good  Recall:  Good  Fund of Knowledge: Good  Language: Good  Assets:  Communication Skills Desire for Improvement Financial Resources/Insurance Housing Resilience Social  Support Transportation Vocational/Educational  ADL's:  Intact  Cognition: WNL  Prognosis:  Good   DIAGNOSES:    ICD-10-CM   1. Attention deficit hyperactivity disorder (ADHD), combined type  F90.2 amphetamine -dextroamphetamine  (ADDERALL) 20 MG tablet    amphetamine -dextroamphetamine  (ADDERALL) 20 MG tablet    amphetamine -dextroamphetamine  (ADDERALL) 20 MG tablet    2. Insomnia, unspecified type  G47.00       Receiving Psychotherapy: No   RECOMMENDATIONS:  PDMP was reviewed.  Last Adderall filled 04/02/2024.  Sonata  filled 03/09/2024.  Oxycodone known to me. I provided approximately  20 minutes of face to face time during this encounter, including time spent before and after the visit in records review, medical decision making, counseling pertinent to today's visit, and charting.   Marcey is doing well on the current treatment so no changes are needed.    Continue Adderall 20 mg, 1 p.o. 3 times daily. Continue trazodone  100 mg, 1/2-2 p.o. nightly as needed sleep. Continue Sonata  10 mg, 1 p.o. nightly as needed.  May repeat 1 as needed for mid nocturnal awakening as long as he has 3 hours left to sleep.   Return in 6 months.  Verneita Cooks, PA-C  "

## 2024-04-15 ENCOUNTER — Other Ambulatory Visit: Payer: Self-pay | Admitting: Family Medicine

## 2024-06-05 ENCOUNTER — Encounter: Admitting: Student

## 2024-10-10 ENCOUNTER — Ambulatory Visit: Admitting: Physician Assistant
